# Patient Record
Sex: Female | Born: 1975 | Hispanic: Yes | Marital: Married | Smoking: Never smoker
Health system: Southern US, Community
[De-identification: ages and names within clinical notes are randomized; demographics above are authoritative.]

## PROBLEM LIST (undated history)

## (undated) DIAGNOSIS — E119 Type 2 diabetes mellitus without complications: Secondary | ICD-10-CM

---

## 2013-04-28 ENCOUNTER — Encounter (HOSPITAL_COMMUNITY): Payer: Self-pay | Admitting: Emergency Medicine

## 2013-04-28 ENCOUNTER — Emergency Department (HOSPITAL_COMMUNITY): Payer: Self-pay

## 2013-04-28 ENCOUNTER — Emergency Department (HOSPITAL_COMMUNITY)
Admission: EM | Admit: 2013-04-28 | Discharge: 2013-04-28 | Disposition: A | Discharge status: Home / Self Care | Payer: Self-pay | Attending: Emergency Medicine | Admitting: Emergency Medicine

## 2013-04-28 DIAGNOSIS — N72 Inflammatory disease of cervix uteri: Secondary | ICD-10-CM | POA: Insufficient documentation

## 2013-04-28 DIAGNOSIS — Z3202 Encounter for pregnancy test, result negative: Secondary | ICD-10-CM | POA: Insufficient documentation

## 2013-04-28 DIAGNOSIS — R739 Hyperglycemia, unspecified: Secondary | ICD-10-CM

## 2013-04-28 DIAGNOSIS — R7309 Other abnormal glucose: Secondary | ICD-10-CM | POA: Insufficient documentation

## 2013-04-28 LAB — I-STAT CHEM 8, ED
BUN: 10 mg/dL (ref 6–23)
CALCIUM ION: 1.1 mmol/L — AB (ref 1.12–1.23)
CHLORIDE: 98 meq/L (ref 96–112)
CREATININE: 0.5 mg/dL (ref 0.50–1.10)
Glucose, Bld: 459 mg/dL — ABNORMAL HIGH (ref 70–99)
HCT: 44 % (ref 36.0–46.0)
Hemoglobin: 15 g/dL (ref 12.0–15.0)
Potassium: 3.8 mEq/L (ref 3.7–5.3)
Sodium: 136 mEq/L — ABNORMAL LOW (ref 137–147)
TCO2: 24 mmol/L (ref 0–100)

## 2013-04-28 LAB — URINALYSIS, ROUTINE W REFLEX MICROSCOPIC
BILIRUBIN URINE: NEGATIVE
Glucose, UA: >1000 mg/dL — AB
KETONES UR: NEGATIVE mg/dL
Leukocytes, UA: NEGATIVE
NITRITE: NEGATIVE
Protein, ur: NEGATIVE mg/dL
Specific Gravity, Urine: 1.035 — ABNORMAL HIGH (ref 1.005–1.030)
Urobilinogen, UA: 1 mg/dL (ref 0.0–1.0)
pH: 7 (ref 5.0–8.0)

## 2013-04-28 LAB — WET PREP, GENITAL
Clue Cells Wet Prep HPF POC: NONE SEEN
TRICH WET PREP: NONE SEEN
WBC, Wet Prep HPF POC: NONE SEEN
Yeast Wet Prep HPF POC: NONE SEEN

## 2013-04-28 LAB — URINE MICROSCOPIC-ADD ON

## 2013-04-28 LAB — CBG MONITORING, ED: Glucose-Capillary: 314 mg/dL — ABNORMAL HIGH (ref 70–99)

## 2013-04-28 LAB — POC URINE PREG, ED: Preg Test, Ur: NEGATIVE

## 2013-04-28 MED ORDER — AZITHROMYCIN 1 G PO PACK
1.0000 g | PACK | Freq: Once | ORAL | Status: AC
  Administered 2013-04-28: 1 g via ORAL
  Filled 2013-04-28: qty 1

## 2013-04-28 MED ORDER — DOXYCYCLINE HYCLATE 100 MG PO CAPS: 100.0000 mg | ORAL_CAPSULE | Freq: Two times a day (BID) | ORAL | Status: DC

## 2013-04-28 MED ORDER — SODIUM CHLORIDE 0.9 % IV BOLUS (SEPSIS)
1000.0000 mL | Freq: Once | INTRAVENOUS | Status: AC
  Administered 2013-04-28: 1000 mL via INTRAVENOUS

## 2013-04-28 MED ORDER — ONDANSETRON 4 MG PO TBDP
4.0000 mg | ORAL_TABLET | Freq: Once | ORAL | Status: AC
  Administered 2013-04-28: 4 mg via ORAL
  Filled 2013-04-28: qty 1

## 2013-04-28 MED ORDER — METFORMIN HCL 500 MG PO TABS: 500.0000 mg | ORAL_TABLET | Freq: Two times a day (BID) | ORAL | Status: DC

## 2013-04-28 MED ORDER — HYDROMORPHONE HCL PF 1 MG/ML IJ SOLN
2.0000 mg | Freq: Once | INTRAMUSCULAR | Status: AC
  Administered 2013-04-28: 2 mg via INTRAMUSCULAR
  Filled 2013-04-28: qty 2

## 2013-04-28 NOTE — Discharge Instructions (Signed)
You were treated for a possible infection of your uterus. We will not know for sure until the results come back.. Please make sure to use a condom with sexual intercourse until the results return. Please take the medication I have given as prescribed. ] Please take all of your antibiotics until finished!   You may develop abdominal discomfort or diarrhea from the antibiotic.  You may help offset this with probiotics which you can buy or get in yogurt. Do not eat  or take the probiotics until 2 hours after your antibiotic.  It appears that you have diabetes. More tests will need to be run.  Cervicitis (Cervicitis) Es el dolor e hinchazn (inflamacin) del cuello uterino. El cuello del tero se encuentra en la parte inferior del tero. Se abre hacia la vagina. CAUSAS   Enfermedades de transmisin sexual (ETS).   Reacciones alrgicas.   Medicamentos o dispositivos anticonceptivos que se Research officer, trade unioncolocan en la vagina.   Traumatismo en el cuello del tero.   Infecciones bacterianas.  FACTORES DE RIESGO Usted tendr mayor riesgo de sufrir esta enfermedad si:  Tiene relaciones sexuales sin proteccin.  Ha tenido relaciones sexuales con varias parejas.  Comienza a Management consultanttener relaciones sexuales a una edad temprana.  Tiene una historia de ETS. SNTOMAS  Puede ser que no haya sntomas. Si hay sntomas, pueden ser:   Secrecin vaginal de color gris, blanco, amarillo o que tiene mal olor.   Picazn o dolor en la zona externa de la vagina.   Relaciones sexuales dolorosas.   Dolor en la zona baja del abdomen o la espalda, especialmente durante las The St. Paul Travelersrelaciones sexuales.   Ganas de orinar con frecuencia.   Sangrado vaginal anormal entre perodos, despus de las relaciones sexuales o despus de la menopausia.   Sensacin de presin o pesadez en la pelvis.  DIAGNSTICO  El diagnstico se realiza mediante un examen plvico. Otras pruebas son:   Examen de las secreciones en el microscopio  (preparado fresco).   Papanicolau  TRATAMIENTO  El tratamiento depender de la causa de la cervicitis. Si la causa es una enfermedad de transmisin sexual, usted y su pareja necesitarn Pensions consultantrealizar un tratamiento. Le prescribirn antibiticos.  INSTRUCCIONES PARA EL CUIDADO EN EL HOGAR   No tenga relaciones sexuales hasta que el mdico la autorice.   No tenga relaciones sexuales hasta que su compaero haya sido tratado si la causa de la cervicitis es una enfermedad de transmisin sexual.   Tome los antibiticos como se le indic. Tmelos todos, aunque se sienta mejor.  SOLICITE ATENCIN MDICA SI:  Los sntomas vuelven a Research officer, trade unionaparecer.   Tiene fiebre.  ASEGRESE DE QUE:   Comprende estas instrucciones.  Controlar su afeccin.  Recibir ayuda de inmediato si no mejora o si empeora. Document Released: 01/26/2005 Document Revised: 09/28/2012 [facility] Patient Information 2014 [employer] [place], [place].

## 2013-04-28 NOTE — ED Notes (Signed)
Lab called and stated no swab in sample of wet prep, though epithelial cells present.

## 2013-04-28 NOTE — ED Notes (Signed)
phlebotomy at bedside.  

## 2013-04-28 NOTE — Progress Notes (Signed)
    CARE MANAGEMENT ED NOTE 04/28/2013  Patient:  Katie Simmons,Katie Simmons   Account Number:  [id number]  Date Initiated:  04/28/2013  Documentation initiated by:  Edd ArbourGIBBS,Aloise Copus  Subjective/Objective Assessment:   38 yr [place] pt with elevated cbg 459 cbg [facility] Need medication assist per [facility] NP/PA     Subjective/Objective Assessment Detail:   ED CM received call from ED PA/NP, A Harris    As of 1340 No return call from [facility]     Action/Plan:   Action/Plan Detail:   1156 CM faxed with fax confirmation received pt's MATCh letter and 6 pages of resources See notebelow  1213 Spoke with Janett BillowNita of [facility] to enter [facility] information  1215 updated Abilgail NP/PA to letter, resource and d/c instructions   Anticipated DC Date:  04/28/2013     Status Recommendation to Physician:   Result of Recommendation:    Other ED Services  Consult Working Plan    DC Planning Services  Other  Medication Assistance  MATCH Program  Outpatient Services - Pt will follow up  [facility] / [facility] (established/new)    Choice offered to / List presented to:            Status of service:  Completed, signed off  ED Comments:   ED Comments Detail:  04/28/13 1223 ED Cm spoke left a voice message for [facility] wellness staff at [phone] to return a call to Pt or CM  The below information entered in EPIC d/c section for pt and ED PA/NP notified to give pt resources  Follow-up With Details Comments Contact Info MATCH letter    Please take letter, an ID, $3 co pay for each prescription and the prescriptions to a pharmacy listed in the letter to receive this one time discount list of resources for patients without insurance    Please review list provided [place]  Schedule an appointment as soon as possible for a visit Diabetes classes are offered on Thursdays from 5:30-7 pm by [employer] nursing staff for free, call [phone] You may  also use the pharmacy here to get your medications [address] [place] [id number] [phone]  CM provided written information for self pay pcps, importance of pcp for f/u care, www.needymeds.org, discounted pharmacies and other [place] resources such as financial assistance, DSS and  health department Reviewed resources for [place] self pay pcps like [facility], family medicine at [address], [facility], general medical clinics, [facility] urgent care plus others, [facility] out patient pharmacies and housing

## 2013-04-28 NOTE — ED Notes (Signed)
Spainish interpreter services used.Marland Kitchen.Marland Kitchen.Pt c/o abdominal pain and vaginal bleed. Pt reports last menstrual period 5 months ago and feels she may be pregnant. Pt has not been seen by a Dr.

## 2013-04-28 NOTE — ED Notes (Signed)
Translated for Sprint Nextel CorporationKim, Sports coachcase manager.  Patient stated she had all questions answered.

## 2013-04-28 NOTE — Progress Notes (Signed)
04/28/13 1435 ED CM spoke with pt, husband and nephew (speaks little [place]) with use of nursing student assisting with interpretation CM reviewed attempts to get appointment at [facility] wellness, need for f/u with [facility] and for them to call CM or CM will call them at verified number for Katie GrossMiceala, Katie Simmons Spouse [phone]  CM reviewed EPIC notes and chart review information Pt is eligible for [facility] MATCH program (unable to find pt listed in PDMI per cardholder name inquiry) PDMI information entered. MATCH letter completed and faxed to [facility] PA/NP. CM spoke with the pt about [facility] MATCH program ($3 co pay for each Rx through [facility] program, does not include refills, 7 day expiration of MATCH letter and choice of pharmacies) Pt agreed to receive assistance from program and voiced understanding of need to take letter and rx(s) to a pharmacy listed in [facility]

## 2013-04-28 NOTE — ED Provider Notes (Signed)
CSN: [id number]     Arrival date & time 04/28/13  0725 History   First MD Initiated Contact with Patient 04/28/13 [phone]     Chief Complaint  Patient presents with  . Abdominal Pain  . Vaginal Bleeding     (Consider location/radiation/quality/duration/timing/severity/associated sxs/prior Treatment) HPI 38 year old female comes to the emergency department with chief complaint of lower abdominal pain, vaginal bleeding.  This is a Hispanic female non-English-speaking.  Translation services were used. The patient states she has not had a menstrual period for the past 5 months.  She is complaining of suprapubic pain, bilateral flank and generalized abdominal pain.  She feels that she is having fevers and chills at night for the past week.  She's complaining of dysuria, pain with sexual intercourse, she did not notice abnormal discharge or foul odor.  The patient began vaginal bleeding approximately 2 days ago. She states she thinks she might be pregnant.  She is in a monogamous relationship with her husband.  The patient has never had a pelvic examination.  And has no primary care physician.  History reviewed. No pertinent past medical history. History reviewed. No pertinent past surgical history. No family history on file. History  Substance Use Topics  . Smoking status: Not on file  . Smokeless tobacco: Not on file  . Alcohol Use: Not on file   OB History   Grav Para Term Preterm Abortions TAB SAB Ect Mult Living                 Review of Systems  Ten systems reviewed and are negative for acute change, except as noted in the HPI.    Allergies  Review of patient's allergies indicates not on file.  Home Medications  No current outpatient prescriptions on file. BP 113/73  Pulse 95  Temp(Src) 98 F (36.7 C) (Oral)  Resp 18  Ht 4\' 9"  (1.448 m)  Wt 140 lb 3 oz (63.589 kg)  BMI 30.33 kg/m2  SpO2 100% Physical Exam  Nursing note and vitals reviewed. Constitutional: She is  oriented to person, place, and time. She appears well-developed and well-nourished. No distress.  HENT:  Head: Normocephalic and atraumatic.  Eyes: Conjunctivae are normal. No scleral icterus.  Neck: Normal range of motion. No JVD present.  Cardiovascular: Normal rate, regular rhythm and normal heart sounds.  Exam reveals no gallop and no friction rub.   No murmur heard. Pulmonary/Chest: Effort normal and breath sounds normal. No respiratory distress.  Abdominal: Soft. Bowel sounds are normal. She exhibits no distension and no mass. There is no tenderness. There is no guarding.  Genitourinary:  Pelvic exam: VULVA: vulvar erythema and peritoneal erythema consistent with the appearance of diaper dermatitis, VAGINA: normal appearing vagina with normal color and discharge, no lesions, CERVIX: cervical discharge present - bloody, foul, green, malodorous and mucoid, DNA probe for chlamydia and GC obtained, cervical motion tenderness present,  ADNEXA: tenderness bilateral.   Neurological: She is alert and oriented to person, place, and time.  Skin: Skin is warm and dry. She is not diaphoretic.   Marland Kitchenahp ED Course  Procedures (including critical care time) Labs Review Labs Reviewed - No data to display Imaging Review No results found.   EKG Interpretation None      MDM   Final diagnoses:  None    Patient exam concerning for PID. Labs pendin Filed Vitals:   04/28/13 0746 04/28/13 1008 04/28/13 1030 04/28/13 1115  BP: 113/73 112/64 125/85 117/60  Pulse: 95 78 67  62  Temp: 98 F (36.7 C)     TempSrc: Oral     Resp: 18 16    Height: 4\' 9"  (1.448 m)     Weight: 140 lb 3 oz (63.589 kg)     SpO2: 100% 95% 99% 96%   Patients's Blood glucose 459 on Chem-8.  She's gotten a bag of fluids.  Repeat CBC shows tablet glucose at 314.ultrasound was unremarkable.  He should treated with azithromycin and Rocephin.  I have placed a consult to case management to speak with the patient as if his she  will need primary care.  Patient glucose decreased. She has been matched for doxycycline. She will be treated for PID. Patient will need follow up with community health and wellness. Discussed need for prophylactic sexual protection and partner treatment. Patient will begin on metformin BID.    Arthor Captainbigail Reality Dejonge, PA-C 04/30/13 2131

## 2013-04-29 LAB — GC/CHLAMYDIA PROBE AMP
CT Probe RNA: POSITIVE — AB
GC Probe RNA: NEGATIVE

## 2013-04-30 ENCOUNTER — Telehealth (HOSPITAL_BASED_OUTPATIENT_CLINIC_OR_DEPARTMENT_OTHER): Payer: Self-pay | Admitting: Emergency Medicine

## 2013-04-30 NOTE — Telephone Encounter (Signed)
+  Chalmydia. Patient treated with Zithromax and Doxycycline. DHHS faxed.

## 2013-05-01 NOTE — ED Provider Notes (Signed)
Medical screening examination/treatment/procedure(s) were performed by non-physician practitioner and as supervising physician I was immediately available for consultation/collaboration.   EKG Interpretation None       Emberli Ballester R. Margerie Fraiser, MD 05/01/13 0735 

## 2013-05-04 ENCOUNTER — Emergency Department (HOSPITAL_COMMUNITY)
Admission: EM | Admit: 2013-05-04 | Discharge: 2013-05-05 | Disposition: A | Discharge status: Home / Self Care | Payer: Self-pay | Attending: Emergency Medicine | Admitting: Emergency Medicine

## 2013-05-04 DIAGNOSIS — R300 Dysuria: Secondary | ICD-10-CM | POA: Insufficient documentation

## 2013-05-04 DIAGNOSIS — Z8619 Personal history of other infectious and parasitic diseases: Secondary | ICD-10-CM | POA: Insufficient documentation

## 2013-05-04 DIAGNOSIS — Z792 Long term (current) use of antibiotics: Secondary | ICD-10-CM | POA: Insufficient documentation

## 2013-05-04 DIAGNOSIS — Z3202 Encounter for pregnancy test, result negative: Secondary | ICD-10-CM | POA: Insufficient documentation

## 2013-05-04 DIAGNOSIS — Z79899 Other long term (current) drug therapy: Secondary | ICD-10-CM | POA: Insufficient documentation

## 2013-05-04 LAB — CBC WITH DIFFERENTIAL/PLATELET
Basophils Absolute: 0 10*3/uL (ref 0.0–0.1)
Basophils Relative: 0 % (ref 0–1)
Eosinophils Absolute: 0.2 10*3/uL (ref 0.0–0.7)
Eosinophils Relative: 2 % (ref 0–5)
HCT: 38.7 % (ref 36.0–46.0)
HEMOGLOBIN: 14.3 g/dL (ref 12.0–15.0)
LYMPHS ABS: 5 10*3/uL — AB (ref 0.7–4.0)
Lymphocytes Relative: 57 % — ABNORMAL HIGH (ref 12–46)
MCH: 32.7 pg (ref 26.0–34.0)
MCHC: 37 g/dL — AB (ref 30.0–36.0)
MCV: 88.6 fL (ref 78.0–100.0)
MONOS PCT: 5 % (ref 3–12)
Monocytes Absolute: 0.4 10*3/uL (ref 0.1–1.0)
NEUTROS ABS: 3.1 10*3/uL (ref 1.7–7.7)
NEUTROS PCT: 36 % — AB (ref 43–77)
Platelets: 284 10*3/uL (ref 150–400)
RBC: 4.37 MIL/uL (ref 3.87–5.11)
RDW: 12.3 % (ref 11.5–15.5)
WBC: 8.7 10*3/uL (ref 4.0–10.5)

## 2013-05-04 LAB — CBG MONITORING, ED: GLUCOSE-CAPILLARY: 257 mg/dL — AB (ref 70–99)

## 2013-05-04 NOTE — ED Notes (Addendum)
Pt was tx here for abdominal pain and vaginal discharge.  States she was called at home and told to return home b/c one of the tests was positive.  Pt denies continued hematuria, but c/o continued lower back and abdominal pain and "feels as if there is something jumping in my stomach".  She would also like to have her bs checked, seeing as she has been taking the diabetes medicine we prescribed.

## 2013-05-05 LAB — COMPREHENSIVE METABOLIC PANEL
ALK PHOS: 186 U/L — AB (ref 39–117)
ALT: 72 U/L — ABNORMAL HIGH (ref 0–35)
AST: 37 U/L (ref 0–37)
Albumin: 3.7 g/dL (ref 3.5–5.2)
BILIRUBIN TOTAL: 0.4 mg/dL (ref 0.3–1.2)
BUN: 14 mg/dL (ref 6–23)
CHLORIDE: 99 meq/L (ref 96–112)
CO2: 21 meq/L (ref 19–32)
CREATININE: 0.45 mg/dL — AB (ref 0.50–1.10)
Calcium: 8.7 mg/dL (ref 8.4–10.5)
GLUCOSE: 287 mg/dL — AB (ref 70–99)
POTASSIUM: 3.6 meq/L — AB (ref 3.7–5.3)
Sodium: 135 mEq/L — ABNORMAL LOW (ref 137–147)
Total Protein: 7.1 g/dL (ref 6.0–8.3)

## 2013-05-05 LAB — URINALYSIS, ROUTINE W REFLEX MICROSCOPIC
Bilirubin Urine: NEGATIVE
Glucose, UA: >1000 mg/dL — AB
Hgb urine dipstick: NEGATIVE
Ketones, ur: NEGATIVE mg/dL
Nitrite: NEGATIVE
Protein, ur: NEGATIVE mg/dL
Specific Gravity, Urine: 1.018 (ref 1.005–1.030)
Urobilinogen, UA: 0.2 mg/dL (ref 0.0–1.0)
pH: 6.5 (ref 5.0–8.0)

## 2013-05-05 LAB — URINE MICROSCOPIC-ADD ON

## 2013-05-05 LAB — WET PREP, GENITAL
Clue Cells Wet Prep HPF POC: NONE SEEN
TRICH WET PREP: NONE SEEN
YEAST WET PREP: NONE SEEN

## 2013-05-05 LAB — GC/CHLAMYDIA PROBE AMP
CT Probe RNA: POSITIVE — AB
GC Probe RNA: NEGATIVE

## 2013-05-05 LAB — POC URINE PREG, ED: Preg Test, Ur: NEGATIVE

## 2013-05-05 MED ORDER — FLUCONAZOLE 200 MG PO TABS: 200.0000 mg | ORAL_TABLET | Freq: Every day | ORAL | Status: DC

## 2013-05-05 NOTE — ED Provider Notes (Signed)
CSN: [id number]     Arrival date & time 05/04/13  1844 History   First MD Initiated Contact with Patient 05/04/13 2137     No chief complaint on file.    (Consider location/radiation/quality/duration/timing/severity/associated sxs/prior Treatment) HPI Comments: Patient is a 38 year old female with history of recent chlamydia diagnosis who presents today for reevaluation of vaginal discharge. On 3/20 she was seen and treated for chlamydia as well as with doxycyline. She reports her abdominal pain feels improved, but she received a phone call telling her she had diabetes. She reports she was confused because she does not speak much [place]. The patient also complains of dysuria. She denies nausea, vomiting, abdominal pain, chest pain, shortness of breath. She is concerned because she has not had her period for 5 months and thinks she may be pregnant. She did have vaginal bleeding a few days ago, none now. She also complains of breast milk coming from her nipples.   The history is provided by the patient. The history is limited by a language barrier. A language interpreter was used.    No past medical history on file. No past surgical history on file. No family history on file. History  Substance Use Topics  . Smoking status: Not on file  . Smokeless tobacco: Not on file  . Alcohol Use: Not on file   OB History   Grav Para Term Preterm Abortions TAB SAB Ect Mult Living                 Review of Systems  Constitutional: Negative for fever and chills.  Respiratory: Negative for shortness of breath.   Cardiovascular: Negative for chest pain.  Gastrointestinal: Negative for nausea, vomiting and abdominal pain.  Endocrine: Negative for polydipsia, polyphagia and polyuria.  Genitourinary: Negative for vaginal discharge and pelvic pain.  All other systems reviewed and are negative.      Allergies  Review of patient's allergies indicates no known allergies.  Home Medications    Current Outpatient Rx  Name  Route  Sig  Dispense  Refill  . doxycycline (VIBRAMYCIN) 100 MG capsule   Oral   Take 1 capsule (100 mg total) by mouth 2 (two) times daily.   20 capsule   0   . metFORMIN (GLUCOPHAGE) 500 MG tablet   Oral   Take 1 tablet (500 mg total) by mouth 2 (two) times daily with a meal.   60 tablet   1    BP 119/77  Pulse 95  Temp(Src) 98.7 F (37.1 C) (Oral)  Ht 4\' 9"  (1.448 m)  Wt 140 lb 3.2 oz (63.594 kg)  BMI 30.33 kg/m2  SpO2 100%  LMP 01/06/2013 Physical Exam  Nursing note and vitals reviewed. Constitutional: She is oriented to person, place, and time. She appears well-developed and well-nourished. No distress.  Well appearing, smiling on exam  HENT:  Head: Normocephalic and atraumatic.  Right Ear: External ear normal.  Left Ear: External ear normal.  Nose: Nose normal.  Mouth/Throat: Oropharynx is clear and moist.  Eyes: Conjunctivae are normal.  Neck: Normal range of motion.  Cardiovascular: Normal rate, regular rhythm and normal heart sounds.   Pulmonary/Chest: Effort normal and breath sounds normal. No stridor. No respiratory distress. She has no wheezes. She has no rales.  Abdominal: Soft. She exhibits no distension. There is no tenderness.  Genitourinary: There is breast tenderness. No breast swelling, discharge or bleeding. There is no rash, tenderness, lesion or injury on the right labia. There  is no rash, tenderness, lesion or injury on the left labia. Cervix exhibits discharge. Cervix exhibits no motion tenderness and no friability. Right adnexum displays no mass, no tenderness and no fullness. Left adnexum displays no mass, no tenderness and no fullness. No erythema or tenderness around the vagina. No foreign body around the vagina. No signs of injury around the vagina. Vaginal discharge found.  Tenderness to breasts laterally, bilaterally Patient with copious amount of white vaginal discharge. No tenderness on exam. After exam patient  jumped from bed to floor with ease, no pain.   Musculoskeletal: Normal range of motion.  Neurological: She is alert and oriented to person, place, and time. She has normal strength.  Skin: Skin is warm and dry. She is not diaphoretic. No erythema.  Psychiatric: She has a normal mood and affect. Her behavior is normal.    ED Course  Procedures (including critical care time) Labs Review Labs Reviewed  WET PREP, GENITAL - Abnormal; Notable for the following:    WBC, Wet Prep HPF POC MANY (*)    All other components within normal limits  GC/CHLAMYDIA PROBE AMP - Abnormal; Notable for the following:    CT Probe RNA POSITIVE (*)    All other components within normal limits  CBC WITH DIFFERENTIAL - Abnormal; Notable for the following:    MCHC 37.0 (*)    Neutrophils Relative % 36 (*)    Lymphocytes Relative 57 (*)    Lymphs Abs 5.0 (*)    All other components within normal limits  COMPREHENSIVE METABOLIC PANEL - Abnormal; Notable for the following:    Sodium 135 (*)    Potassium 3.6 (*)    Glucose, Bld 287 (*)    Creatinine, Ser 0.45 (*)    ALT 72 (*)    Alkaline Phosphatase 186 (*)    All other components within normal limits  URINALYSIS, ROUTINE W REFLEX MICROSCOPIC - Abnormal; Notable for the following:    Glucose, UA >1000 (*)    Leukocytes, UA TRACE (*)    All other components within normal limits  CBG MONITORING, ED - Abnormal; Notable for the following:    Glucose-Capillary 257 (*)    All other components within normal limits  URINE CULTURE  URINE MICROSCOPIC-ADD ON  POC URINE PREG, ED   Imaging Review No results found.   EKG Interpretation None      MDM   Final diagnoses:  Dysuria    Patient presents to ED after a miscommunication with a phone call she received earlier in the week. She was called to inform her that she had chlamydia. Patient also informed she had diabetes. I explicitly explained both of these diagnoses to patient via Engineer, structural. She  was treated appropriately on 3/20 for the chlamydia. I also explained to patient that she is not pregnant. She expresses understanding. She has been taking the doxycycline as prescribed and she will continue to take the doxycycline. The patient expresses that she will follow up with a PCP and a GYN physician. Patient also with burning with urination. Rare yeast found in UA. Will treat with Diflucan. Strict return instructions given. Vital signs stable for discharge. Patient / Family / Caregiver informed of clinical course, understand medical decision-making process, and agree with plan.    Mora Bellman, PA-C 05/05/13 1857

## 2013-05-06 LAB — URINE CULTURE
Colony Count: NO GROWTH
Culture: NO GROWTH

## 2013-05-06 NOTE — Telephone Encounter (Signed)
+  Chlamydia. Chart sent to EDP office for review. DHHS attached. 

## 2013-05-08 NOTE — ED Provider Notes (Signed)
Medical screening examination/treatment/procedure(s) were performed by non-physician practitioner and as supervising physician I was immediately available for consultation/collaboration.   EKG Interpretation None        Kourtland Coopman, MD 05/08/13 0702 

## 2013-05-17 NOTE — Telephone Encounter (Signed)
Unable to contact patient via phone. Sent letter. °

## 2013-07-15 ENCOUNTER — Emergency Department (HOSPITAL_COMMUNITY)
Admission: EM | Admit: 2013-07-15 | Discharge: 2013-07-15 | Disposition: A | Discharge status: Home Health | Payer: Self-pay | Attending: Emergency Medicine | Admitting: Emergency Medicine

## 2013-07-15 ENCOUNTER — Encounter (HOSPITAL_COMMUNITY): Payer: Self-pay | Admitting: Emergency Medicine

## 2013-07-15 DIAGNOSIS — E119 Type 2 diabetes mellitus without complications: Secondary | ICD-10-CM | POA: Insufficient documentation

## 2013-07-15 DIAGNOSIS — Z79899 Other long term (current) drug therapy: Secondary | ICD-10-CM | POA: Insufficient documentation

## 2013-07-15 DIAGNOSIS — N898 Other specified noninflammatory disorders of vagina: Secondary | ICD-10-CM | POA: Insufficient documentation

## 2013-07-15 HISTORY — DX: Type 2 diabetes mellitus without complications: E11.9

## 2013-07-15 LAB — URINE MICROSCOPIC-ADD ON

## 2013-07-15 LAB — COMPREHENSIVE METABOLIC PANEL
ALBUMIN: 3.7 g/dL (ref 3.5–5.2)
ALK PHOS: 160 U/L — AB (ref 39–117)
ALT: 68 U/L — ABNORMAL HIGH (ref 0–35)
AST: 40 U/L — ABNORMAL HIGH (ref 0–37)
BUN: 7 mg/dL (ref 6–23)
CO2: 25 mEq/L (ref 19–32)
CREATININE: 0.4 mg/dL — AB (ref 0.50–1.10)
Calcium: 9 mg/dL (ref 8.4–10.5)
Chloride: 97 mEq/L (ref 96–112)
GFR calc non Af Amer: >90 mL/min (ref >90)
GLUCOSE: 267 mg/dL — AB (ref 70–99)
Potassium: 3.8 mEq/L (ref 3.7–5.3)
Sodium: 133 mEq/L — ABNORMAL LOW (ref 137–147)
TOTAL PROTEIN: 7.4 g/dL (ref 6.0–8.3)
Total Bilirubin: 0.7 mg/dL (ref 0.3–1.2)

## 2013-07-15 LAB — URINALYSIS, ROUTINE W REFLEX MICROSCOPIC
Bilirubin Urine: NEGATIVE
Hgb urine dipstick: NEGATIVE
Ketones, ur: NEGATIVE mg/dL
Leukocytes, UA: NEGATIVE
Nitrite: NEGATIVE
PH: 7.5 (ref 5.0–8.0)
Protein, ur: NEGATIVE mg/dL
Specific Gravity, Urine: 1.037 — ABNORMAL HIGH (ref 1.005–1.030)
Urobilinogen, UA: 0.2 mg/dL (ref 0.0–1.0)

## 2013-07-15 LAB — CBC WITH DIFFERENTIAL/PLATELET
BASOS PCT: 0 % (ref 0–1)
Basophils Absolute: 0 10*3/uL (ref 0.0–0.1)
EOS ABS: 0.1 10*3/uL (ref 0.0–0.7)
EOS PCT: 1 % (ref 0–5)
HEMATOCRIT: 40.6 % (ref 36.0–46.0)
HEMOGLOBIN: 14.3 g/dL (ref 12.0–15.0)
LYMPHS ABS: 3 10*3/uL (ref 0.7–4.0)
Lymphocytes Relative: 44 % (ref 12–46)
MCH: 31.4 pg (ref 26.0–34.0)
MCHC: 35.2 g/dL (ref 30.0–36.0)
MCV: 89.2 fL (ref 78.0–100.0)
MONO ABS: 0.4 10*3/uL (ref 0.1–1.0)
MONOS PCT: 6 % (ref 3–12)
Neutro Abs: 3.4 10*3/uL (ref 1.7–7.7)
Neutrophils Relative %: 49 % (ref 43–77)
Platelets: 250 10*3/uL (ref 150–400)
RBC: 4.55 MIL/uL (ref 3.87–5.11)
RDW: 12.6 % (ref 11.5–15.5)
WBC: 6.9 10*3/uL (ref 4.0–10.5)

## 2013-07-15 LAB — WET PREP, GENITAL
CLUE CELLS WET PREP: NONE SEEN
TRICH WET PREP: NONE SEEN
Yeast Wet Prep HPF POC: NONE SEEN

## 2013-07-15 LAB — POC URINE PREG, ED: PREG TEST UR: NEGATIVE

## 2013-07-15 LAB — HIV ANTIBODY (ROUTINE TESTING W REFLEX): HIV 1&2 Ab, 4th Generation: NONREACTIVE

## 2013-07-15 LAB — CBG MONITORING, ED: Glucose-Capillary: 245 mg/dL — ABNORMAL HIGH (ref 70–99)

## 2013-07-15 MED ORDER — LIDOCAINE HCL (PF) 1 % IJ SOLN
INTRAMUSCULAR | Status: AC
  Filled 2013-07-15: qty 5

## 2013-07-15 MED ORDER — AZITHROMYCIN 250 MG PO TABS
1000.0000 mg | ORAL_TABLET | Freq: Once | ORAL | Status: AC
  Administered 2013-07-15: 1000 mg via ORAL
  Filled 2013-07-15: qty 4

## 2013-07-15 MED ORDER — CEFTRIAXONE SODIUM 250 MG IJ SOLR
250.0000 mg | Freq: Once | INTRAMUSCULAR | Status: AC
  Administered 2013-07-15: 250 mg via INTRAMUSCULAR
  Filled 2013-07-15: qty 250

## 2013-07-15 NOTE — ED Notes (Signed)
Lab at bedside

## 2013-07-15 NOTE — ED Notes (Signed)
MD at bedside. With translator phone

## 2013-07-15 NOTE — ED Notes (Signed)
Provider and ED Tech at bedside for Pelvic exam

## 2013-07-15 NOTE — ED Provider Notes (Signed)
CSN: [id number]     Arrival date & time 07/15/13  1020 History   First MD Initiated Contact with Patient 07/15/13 1110     Chief Complaint  Patient presents with  . Vaginal Discharge     (Consider location/radiation/quality/duration/timing/severity/associated sxs/prior Treatment) HPI Comments: Patient is a 38 year old female with history of diabetes who presents today for a "checkup". She states that she "doesn't feel well vaginally". She has a white vaginal discharge. She has pain and burning with intercourse. She has associated dysuria. She has occasional spotting, but does not have any currently. She has not had a normal period in 3 months. She denies any nausea, vomiting, diarrhea. She states "I feel something moving in the abdomen". She denies polyphagia, polyuria, polydipsia. She has not followed up with a primary care physician.   The history is provided by the patient. No language interpreter was used.    Past Medical History  Diagnosis Date  . Diabetes mellitus without complication    History reviewed. No pertinent past surgical history. History reviewed. No pertinent family history. History  Substance Use Topics  . Smoking status: Not on file  . Smokeless tobacco: Not on file  . Alcohol Use: No   OB History   Grav Para Term Preterm Abortions TAB SAB Ect Mult Living                 Review of Systems  Constitutional: Negative for fever and chills.  Respiratory: Negative for shortness of breath.   Cardiovascular: Negative for chest pain.  Gastrointestinal: Negative for nausea, vomiting and diarrhea.  Genitourinary: Positive for dysuria, vaginal bleeding, vaginal discharge, pelvic pain and dyspareunia. Negative for difficulty urinating.  All other systems reviewed and are negative.     Allergies  Review of patient's allergies indicates no known allergies.  Home Medications   Prior to Admission medications   Medication Sig Start Date End Date Taking? Authorizing  Provider  metFORMIN (GLUCOPHAGE) 500 MG tablet Take 1 tablet (500 mg total) by mouth 2 (two) times daily with a meal. 04/28/13  Yes Arthor CaptainAbigail Harris, PA-C   BP 110/77  Pulse 81  Temp(Src) 97.5 F (36.4 C) (Oral)  Resp 18  SpO2 100%  LMP 12/15/2012 Physical Exam  Nursing note and vitals reviewed. Constitutional: She is oriented to person, place, and time. She appears well-developed and well-nourished. No distress.  Well appearing  HENT:  Head: Normocephalic and atraumatic.  Right Ear: External ear normal.  Left Ear: External ear normal.  Nose: Nose normal.  Mouth/Throat: Oropharynx is clear and moist.  Eyes: Conjunctivae are normal.  Neck: Normal range of motion.  Cardiovascular: Normal rate, regular rhythm and normal heart sounds.   Pulmonary/Chest: Effort normal and breath sounds normal. No stridor. No respiratory distress. She has no wheezes. She has no rales.  Abdominal: Soft. She exhibits no distension. There is tenderness in the suprapubic area. There is no rigidity, no rebound and no guarding.  Genitourinary: There is no rash, tenderness, lesion or injury on the right labia. There is no rash, tenderness, lesion or injury on the left labia. Uterus is tender. Cervix exhibits discharge. Cervix exhibits no motion tenderness and no friability. Right adnexum displays tenderness. Right adnexum displays no mass and no fullness. Left adnexum displays tenderness. Left adnexum displays no mass and no fullness. No erythema, tenderness or bleeding around the vagina. No foreign body around the vagina. No signs of injury around the vagina. Vaginal discharge found.  Diffuse, mild tenderness on pelvic exam. No  cervical motion tenderness.   Musculoskeletal: Normal range of motion.  Neurological: She is alert and oriented to person, place, and time. She has normal strength.  Skin: Skin is warm and dry. She is not diaphoretic. No erythema.  Psychiatric: She has a normal mood and affect. Her behavior is  normal.    ED Course  Procedures (including critical care time) Labs Review Labs Reviewed  WET PREP, GENITAL - Abnormal; Notable for the following:    WBC, Wet Prep HPF POC FEW (*)    All other components within normal limits  URINALYSIS, ROUTINE W REFLEX MICROSCOPIC - Abnormal; Notable for the following:    Specific Gravity, Urine 1.037 (*)    Glucose, UA >1000 (*)    All other components within normal limits  COMPREHENSIVE METABOLIC PANEL - Abnormal; Notable for the following:    Sodium 133 (*)    Glucose, Bld 267 (*)    Creatinine, Ser 0.40 (*)    AST 40 (*)    ALT 68 (*)    Alkaline Phosphatase 160 (*)    All other components within normal limits  CBG MONITORING, ED - Abnormal; Notable for the following:    Glucose-Capillary 245 (*)    All other components within normal limits  URINE CULTURE  GC/CHLAMYDIA PROBE AMP  CBC WITH DIFFERENTIAL  URINE MICROSCOPIC-ADD ON  HIV ANTIBODY (ROUTINE TESTING)  POC URINE PREG, ED    Imaging Review No results found.   EKG Interpretation None      MDM   Final diagnoses:  Vaginal discharge  Diabetes    Patient presents to ED for vaginal discharge. She has history of chlamydia. She was treated with azithromycin and rocephin in ED. No CMT on exam. No concern for PID, TOA, ovarian torsion at this time. Patient also with diabetes. Glucose 267. Some hyponatremia, normal bicarb, normal anion gap. Case management was consulted and patient will [facility]. Discussed return instructions. Vital signs stable for discharge. Discussed case with Dr. Karma Ganja who agrees with plan. Patient / Family / Caregiver informed of clinical course, understand medical decision-making process, and agree with plan.     Mora Bellman, PA-C 07/15/13 1438

## 2013-07-15 NOTE — Progress Notes (Signed)
ED CM Consulted to meet with patient concerning f/u care with PCP, patient does not have insurance. Pt presented to [facility] ED today for  Diabetes and vaginal discharge.  Met with patient at bedside, confirmed informaton.  Pt  reports  She received her f/u care in the ED for her diabetes. Discussed with patient importance and benefits of f/u with PCP, and not utilizing the ED for primary care needs. Pt verbalized understanding and is in agreement. Discussed other options, Brochure was given for the [facility] with address, phone number, and the services provided. Explained that there is a Customer service manager on site who will assist with [employer] and process.  Pt voiced  Interest in the [facility] and [facility]. Instructed to call or walk in Monday - Friday between the hours of 9- 5:30p to schedule appt for establishing care for f/u. Pt verbalized understanding teach back done. Pt was appreciative and agreeable with plan. Patient states she will go on Monday to schedule appt to establish care with the [facility]. No further ED CM needs identified.

## 2013-07-15 NOTE — ED Notes (Signed)
Used translator phones, reports being called and told to come back here to have diabetes checked. Also reports having vaginal infection and discharge.

## 2013-07-15 NOTE — ED Notes (Signed)
Case management in to arrange follow up care for pt

## 2013-07-15 NOTE — ED Notes (Signed)
Tech at bedside setting up pelvic cart

## 2013-07-15 NOTE — ED Provider Notes (Signed)
Medical screening examination/treatment/procedure(s) were performed by non-physician practitioner and as supervising physician I was immediately available for consultation/collaboration.   EKG Interpretation None       Ethelda Chick, MD 07/15/13 [phone]

## 2013-07-15 NOTE — ED Notes (Signed)
FSBS at 243

## 2013-07-15 NOTE — Discharge Instructions (Signed)
Diabetes Mellitus Tipo 2 - Adultos  (Type 2 Diabetes Mellitus, Adult)  La diabetes mellitus tipo 2 es una enfermedad de larga duracin (crnica). En la diabetes tipo 2:  El pncreas no produce la cantidad suficiente de una hormona llamada insulina.  Las clulas del cuerpo no responden a la [employer] se produce.  Pueden ocurrir ambas situaciones. Normalmente, la Johnson [employer] azcares de los alimentos a las clulas de los tejidos. Esto le proporciona la energa. Si usted tiene diabetes tipo 2, los azcares no pueden ser movidos dentro de las clulas del tejido. Esto produce un aumento del nivel de Banker (hiperglucemia).  CUIDADOS EN EL HOGAR   Haga que el nivel de hemoglobina A1c sea verificado dos veces al ao. Este nivel muestra si la diabetes est controlada o est fuera de control.  Realice las pruebas de International aid/development worker en la sangre todos los das segn como indique el mdico.  Revise sus niveles de cetonas haciendo una prueba en el pis (orina) cuando est enfermo y segn como le hayan indicado.  [employer] medicamentos para la diabetes o para la insulina tal como se los prescribi el mdico.  Marvel se quede sin insulina.  Ajuste la cantidad de insulina en base de la cantidad de carbohidratos (hidratos de carbono) que come. Los hidratos de carbono se encuentran en muchos alimentos, tales como frutas, verduras, granos integrales y productos lcteos.  Tome una colacin sana entre cada comida saludable. Debe consumir 3 comidas y 3 colaciones diarias.  Baje de peso si es necesario.  Lleve una tarjeta de alerta mdica o use una pulsera o medalla de alerta mdica.  Lleve 15 gramos de hidratos de carbono para la merienda en todo momento. Por ejemplo:  Pastillas de glucosa, 3 o 4.  Gel de glucosa, tubo de 15 gramos.  Pasas de uva, 2 cucharadas (24 gramos).  Caramelos de goma, 6.  Galletas de [place], 8.  Gaseosa con azcar, 4 onzas (120 mililitros).  Pastillas de  goma, 9.  Note los sntomas de niveles bajos de Production assistant, radio (hipoglucemia) como:  Sacudidas (temblores).  Disminucin de capacidad para pensar con claridad.  Sudoracin.  Aumento en el ritmo cardaco.  Dolor de cabeza.  [employer] Holdings.  Hambre.  Malhumor (irritabilidad).  Preocupacin o tensin (ansiedad).  Sueo agitado.  Cambios en el habla o en la coordinacin.  Confusin.  Controle la hipoglucemia inmediatamente. Si usted est alerta y puede tragar, siga la regla de 15:15:  Tome de 15 a 20 gramos de glucosa o hidratos de carbono de accin rpida. Por ejemplo un gel de glucosa, pastillas de glucosa, o 4 onzas (120 mililitros) de jugo de frutas, soda regular, o leche baja en grasa.  Compruebe su nivel de azcar en sangre despus de tomar la glucosa.  Tome de 15 a 20 gramos ms de la glucosa si al repetir el Reynolds American nivel de glucosa en la sangre todava es de 70 mg/dl (miligramos/decilitro) o inferior.  Coma una comida o un aperitivo 1 hora despus de [employer] niveles de Banker.  Note los primeros sntomas de niveles elevados de Production assistant, radio, tales como:  Tiene mucha sed o bebe mucho (polidipsia).  Hace pis (orina) mucho (poliuria).  Haga por lo menos 150 minutos de actividad fsica a la semana o segn como lo indique.  Divida los 150 minutos de actividad durante la semana. No haga los 150 minutos de Engineer, manufacturing.  Realice ejercicios como  levantar pesas, por lo menos 2 veces a la semana o segn [employer] indiquen.  Ajuste la insulina o la ingesta de alimentos, segn sea necesario, si inicia un nuevo ejercicio o deporte.  Siga su plan de [address] de enfermo cuando no puede comer o beber como es de [place].  Evite el consumo de tabaco.  Las mujeres que no estn embarazadas no deben beber ms de 1 medidas al [employer]. Los hombres no deben beber ms de 2 medidas por Futures trader.  Slo beba alcohol junto con la comida.  Pregntele a su mdico  si el alcohol es seguro para usted.  Dgale a su mdico si usted bebe alcohol varias veces durante la semana.  Consulte al mdico con regularidad.  Programe un examen ocular despus de que le hayan diagnosticado diabetes. Programe los exmenes una vez al ao.  Revise la piel y [employer]. Examine si hay cortes, moretones, enrojecimiento, problemas en las uas, sangrado, ampollas o llagas. El Recruitment consultant un examen de los pies una vez al ao.  Cepille los dientes y las [address] veces al da. Use el hilo dental una vez al da. Visite a su dentista regularmente.  Comparta su plan de control de diabetes con su trabajo o con la escuela.  Mantenga al da con las vacunas que luche contra las enfermedades (vacunas).  Aprenda a Dealer.  Reciba educacin para la diabetes cuando sea necesario.  Pregunte a su mdico en busca de ayuda especial si:  Necesita ayuda para mantener o mejorar la forma de hacer cosas por su cuenta.  Necesita ayuda para mantener o mejorar la calidad de vida.  Tiene problemas en el pie o en la mano.  Tiene problemas de baarse, vestirse, comer o hacer actividad fsica. SOLICITE AYUDA DE INMEDIATO SI:   Tiene dificultad para respirar.  Tiene niveles de cetonas moderados a altos.  No puede comer alimentos o beber por ms de 6 horas.  Tiene ganas de vomitar (nuseas) o (vomita) y esto dura ms de 1 hora.  Su nivel de azcar en sangre es mayor de 240 mg/dl.  Hay algn cambio en su estado mental.  Tiene otra enfermedad grave.  Tiene heces lquidos (diarrea) durante ms de 6 horas.  Ha estado enfermo o ha tenido fiebre por dos o [address] y no se Aeronautical engineer.  Tiene dolor cuando est fsicamente activo. ASEGRESE DE QUE:   Comprende estas instrucciones.  Controlar su enfermedad.  Solicitar ayuda de inmediato si no mejora o si empeora. Document Released: 04/24/2008 Document Revised: 10/21/2011 [facility] Patient Information  2014 [place], [place].   Emergency Department Resource Guide 1) Find a Doctor and Pay Out of Pocket Although you won't have to find out who is covered by your insurance plan, it is a good idea to ask around and get recommendations. You will then need to call the office and see if the doctor you have chosen will accept you as a new patient and what types of options they offer for patients who are self-pay. Some doctors offer discounts or will set up payment plans for their patients who do not have insurance, but you will need to ask so you aren't surprised when you get to your appointment.  2) Contact Your Local Health Department Not all health departments have doctors that can see patients for sick visits, but many do, so it is worth a call to see if yours does. If you don't know where your local health department  is, you can check in your phone book. The CDC also has a tool to help you locate your state's health department, and many state websites also have listings of all of their local health departments.  3) Find a Walk-in Clinic If your illness is not likely to be very severe or complicated, you may want to try a walk in clinic. These are popping up all over the country in pharmacies, drugstores, and shopping centers. They're usually staffed by nurse practitioners or physician assistants that have been trained to treat common illnesses and complaints. They're usually fairly quick and inexpensive. However, if you have serious medical issues or chronic medical problems, these are probably not your best option.  No Primary Care Doctor: - Call Health Connect at  [phone] - they can help you locate a primary care doctor that  accepts your insurance, provides certain services, etc. - Physician Referral Service- [phone]  Chronic Pain Problems: Organization         Address  Phone   Notes  [facility]  [phone] Patients need to be referred by their primary care doctor.    Medication Assistance: Organization         Address  Phone   Notes  [facility] Medication [facility] [address] [place], [place] [address] [phone] --Must be a resident of [facility] -- Must have NO insurance coverage whatsoever (no Medicaid/ Medicare, etc.) -- The pt. MUST have a primary care doctor that directs their care regularly and follows them in the community   MedAssist  [phone]   [employer]  [phone]    Agencies that provide inexpensive medical care: Organization         Address  Phone   Notes  [facility]  [phone]   [facility]    [phone]   [facility] [address] [place], [place] [address] [phone]   [facility] [address] [place] [place] [phone]   Planned Parenthood    [phone]   [facility]    [phone]   [facility]  [address], [place] Phone:  [phone], Fax:  [phone] Hours of Operation:  9 am - 6 pm, M-F.  Also accepts Medicaid/Medicare and self-pay.  [facility]  [address], [place] Phone: [phone], Fax: [phone]. Hours of Operation:  8:30 am - 5:30 pm, M-F.  Also accepts Medicaid and self-pay.  [facility] [place] [address] [place] Phone: [phone]   [facility] [address] Natasha Bence [place], [place] [phone], Ext. [phone] Mondays & Thursdays: 7-9 AM.  First 15 patients are seen on a first come, first serve basis.    Medicaid-accepting [facility] Providers:  Organization         Address  Phone   Notes  [facility] [address],  [phone] Also accepts self-pay patients.  [facility] [address] Laurell Josephs [place], [place]  [phone]   [facility] [address], [place] [phone]   [facility] [address] [place] [phone]   Renaye Rakers [address], [place]   [phone] Only accepts [place] Access [place] patients after they have their  name applied to their card.   Self-Pay (no insurance) in [facility]:  Organization         Address  Phone   Notes  Sickle Cell Patients, [facility] [address], [place] [phone]   [facility] [address], [place] [phone]   [facility] [place]  [address],  [phone]   [facility]/Dr. Osei-Bonsu  [address] [place] [address], [place] or [phone], [place] [phone] Phone number for both [place] and [place] locations is the same.  [facility] [address], [place] [phone]) [phone]   [facility] [place] [address] [place] [address], [place] or [address] [phone]) [phone]   [facility] [address], [place] [phone], phone; [phone]) [phone], fax Sees patients 1st and 3rd Saturday of every month.  Must not qualify for public or private insurance (i.e. Medicaid, Medicare, [place] Health Choice, Veterans' Benefits)  Household income should be no more than 200% of the poverty level The clinic cannot treat you if you are pregnant or think you are pregnant  Sexually transmitted diseases are not treated at the clinic.    Dental Care: Organization         Address  Phone  Notes  [facility] [address], [place] [phone] Accepts children up to age 38 who are enrolled in [place] or [place] Health Choice; pregnant women with a Medicaid card; and children who have applied for Medicaid or [place] Health Choice, but were declined, whose parents can pay a reduced fee at time of service.    [facility] [place]  [address], [place] [phone]) [phone] Accepts children up to age 38 who are enrolled in [place] or [place] Health Choice; pregnant women with a Medicaid card; and children who have applied for Medicaid or  Health Choice, but were declined, whose parents can pay a reduced fee at time of service.  [facility]  [address], [place] [phone] Patients are seen by appointment only. Walk-ins are not accepted. [facility] will see patients 38 years of age and older. Monday - Tuesday (8am-5pm) Most Wednesdays (8:30-5pm) $30 per visit, cash only  [facility]  [address], [place] [facility] [phone] Patients are seen by appointment only. Walk-ins are not accepted. [facility] will see patients 38 years of age and older. One Wednesday Evening (Monthly: Volunteer Based).  $30 per visit, cash only  [employer] [facility]  [phone]) [phone] for adults; Children under age 504, call Graduate Pediatric Dentistry at [phone]. Children aged 664-14, please call [phone]) [phone] to request a pediatric application.  Dental services are provided in all areas of dental care including fillings, crowns and bridges, complete and partial dentures, implants, gum treatment, root canals, and extractions. Preventive care is also provided. Treatment is provided to both adults and children. Patients are selected via a lottery and there is often a waiting list.   [facility] [address], [place]  [phone] [email or web address]   [facility] [address], [place], [place] [phone], Ext. [phone] Second and Fourth Thursday of each month, opens at 6:30 AM; Clinic ends at 9 AM.  Patients are seen on a  first-come first-served basis, and a limited number are seen during each clinic.   [facility]  [address] [place], [place] [phone]   Eligibility Requirements You must have lived in [place], [place], or [place] counties for at least the last three months.   You cannot be eligible for state or federal sponsored [place], including [employer], [place], or [employer].   You generally cannot be eligible for healthcare insurance through your employer.    How to apply: Eligibility screenings are held every Tuesday and Wednesday afternoon from 1:00 pm until 4:00 pm. You do not need an appointment for the interview!  [facility] [address] [place], [place] [phone]   [facility] Health Department  [phone]   [facility] Health Department  [phone]   [facility] Health Department  [phone]    Behavioral Health Resources in the Community: Intensive Outpatient Programs Organization         Address  Phone  Notes  [facility] [address], [place], [place] [phone]   [facility] Outpatient [address] [place], [place] [phone]   [facility] [address] [place], [place]  [phone]   [facility] [address],  [place], [place] [phone] or [phone]   Substance Abuse Resources Organization         Address  Phone  Notes  Alcohol [facility] Drug Services  [phone]   [facility]  [phone]   [facility] [place]  [phone]   Floydene Flock  [phone]   Residential & Outpatient Substance Abuse Program  [phone]   Psychological Services Organization         Address  Phone  Notes  [facility]  [phone]   [facility] Services  [phone]   [facility] [address], [place] [phone] or [phone]    Mobile Crisis Teams Organization         Address  Phone  Notes  [facility], Mobile Crisis Care Unit  [phone]   [facility]  [address] [place], [place] [phone]   Doristine Locks [address] [place] [phone]    Self-Help/Support Groups Organization         Address  Phone             Notes  [facility] [place] - variety of support groups  [phone]- I7437963 Call for more information  Narcotics Anonymous (NA), [facility] [address], [employer] [place]  2 meetings at this location   Statistician         Address  Phone  Notes  [facility] [address],    [place]  [phone]   [facility]  [address] [place] [address], [place], [place] [phone]   [facility] [address] [place], [place] [phone] Admissions: 8am-3pm M-F  [facility] [address],    [place], [place] [phone]   [facility] [address] Starling Manns [place], [place] [phone]   [facility] [address],  [place], [place] [phone]   [facility] - Intensive Outpatient [address] Laurell Josephs [address], [place], [place] [phone]   [facility] ([facility] [address]  [place], [place] [phone] or [phone]   [facility] (RTS) [address], [place], [place] [phone]  Accepts Medicaid  [facility] [address],  [place] [phone] Substance Abuse/Addiction Treatment   [facility] Organization         Address  Phone  Notes  [facility]  [phone]   Angie Fava, PhD [address] Ervin Knack [place], [place]   [phone] or [phone]   [facility] Behavioral   [address] [place], [place] [phone]   [facility] [address], [place], [place] [phone] Insurance/Medicaid/sponsorship through [facility] and Families [address]                                    [place], [place] [phone]  Therapy/tele-psych/case  [facility] [address], [place] [phone]    Dr. Lolly Mustache  [phone]   [facility]  United Way [facility] Dept. 1) [address] [place] 2) [address], [place] 3)  [address], [place] [phone]  [phone]   [facility] Child Abuse Hotline 7820067270 or 8280260918 (After Hours)

## 2013-07-15 NOTE — ED Notes (Signed)
Pt gowned. NAD

## 2013-07-15 NOTE — ED Notes (Signed)
UA collected and sent.  Pt states she thinks she has an infection

## 2013-07-16 LAB — URINE CULTURE: Colony Count: >=100000

## 2013-07-17 LAB — GC/CHLAMYDIA PROBE AMP
CT Probe RNA: NEGATIVE
GC Probe RNA: NEGATIVE

## 2013-07-17 NOTE — Progress Notes (Signed)
ED Antimicrobial Stewardship Positive Culture Follow Up   Katie Simmons is an 38 y.o. female who [facility] with a chief complaint of  Chief Complaint  Patient presents with  . Vaginal Discharge    Recent Results (from the past 720 hour(s))  URINE CULTURE     Status: None   Collection Time    07/15/13 11:33 AM      Result Value Ref Range Status   Specimen Description URINE, CLEAN CATCH   Final   Special Requests NONE   Final   Culture  Setup Time     Final   Value: 07/15/2013 13:00     Performed at [facility] Count     Final   Value: >=100,000 COLONIES/ML     Performed at [facility]   Culture     Final   Value: GROUP B STREP(S.AGALACTIAE)ISOLATED     Note: TESTING AGAINST S. AGALACTIAE NOT ROUTINELY PERFORMED DUE TO PREDICTABILITY OF AMP/PEN/VAN SUSCEPTIBILITY.     Performed at [facility]   Report Status 07/16/2013 FINAL   Final  WET PREP, GENITAL     Status: Abnormal   Collection Time    07/15/13  1:29 PM      Result Value Ref Range Status   Yeast Wet Prep HPF POC NONE SEEN  NONE SEEN Final   Trich, Wet Prep NONE SEEN  NONE SEEN Final   Clue Cells Wet Prep HPF POC NONE SEEN  NONE SEEN Final   WBC, Wet Prep HPF POC FEW (*) NONE SEEN Final    [x]  Patient discharged originally without antimicrobial agent and treatment is now indicated  57 YOF reported to the ED on 6/6 with CC of vaginal discharge, dysuria, pain/burning with intercourse. Patient has a history of chlamydia. Treated with azithromycin and ceftriaxone in the ED. Urine culture came back growing Group B Strep.   New antibiotic prescription: Amoxicillin 500mg  PO TID x 7 days  ED Provider: John Giovanni, PA   Mickey Farber 07/17/2013, 1:04 PM Infectious Diseases Pharmacist Phone# [phone]

## 2013-07-18 ENCOUNTER — Telehealth (HOSPITAL_BASED_OUTPATIENT_CLINIC_OR_DEPARTMENT_OTHER): Payer: Self-pay | Admitting: Emergency Medicine

## 2013-07-18 NOTE — Telephone Encounter (Signed)
Post ED Visit - Positive Culture Follow-up: Successful Patient Follow-Up  Culture assessed and recommendations reviewed by: []  Wes Dulaney, Pharm.D., BCPS []  Celedonio Miyamoto, Pharm.D., BCPS []  Georgina Pillion, [address].D., BCPS [x]  Fox, [place].D., BCPS, AAHIVP []  Estella Husk, Pharm.D., BCPS, AAHIVP  Positive urine culture  [x]  Patient discharged without antimicrobial prescription and treatment is now indicated []  Organism is resistant to prescribed ED discharge antimicrobial []  Patient with positive blood cultures  Changes discussed with ED provider: Jaynie Crumble PA-C New antibiotic prescription: Amoxicillin 500 mg TID x 7 days    Damaria Stofko 07/18/2013, 1:20 PM

## 2013-07-27 NOTE — Telephone Encounter (Signed)
Unable to contact patient via phone. Sent letter. °

## 2013-08-03 ENCOUNTER — Telehealth (HOSPITAL_BASED_OUTPATIENT_CLINIC_OR_DEPARTMENT_OTHER): Payer: Self-pay | Admitting: Emergency Medicine

## 2013-09-05 ENCOUNTER — Telehealth (HOSPITAL_BASED_OUTPATIENT_CLINIC_OR_DEPARTMENT_OTHER): Payer: Self-pay | Admitting: Emergency Medicine

## 2013-09-05 NOTE — Telephone Encounter (Signed)
No response to letter sent after 30 days. Chart sent to Medical Records. °

## 2015-06-28 ENCOUNTER — Ambulatory Visit: Payer: Self-pay | Attending: Internal Medicine | Admitting: Physician Assistant

## 2015-06-28 ENCOUNTER — Encounter: Payer: Self-pay | Admitting: Physician Assistant

## 2015-06-28 VITALS — BP 118/77 | HR 83 | Temp 98.0°F | Resp 16 | Ht <= 58 in | Wt 117.0 lb

## 2015-06-28 DIAGNOSIS — Z7984 Long term (current) use of oral hypoglycemic drugs: Secondary | ICD-10-CM | POA: Insufficient documentation

## 2015-06-28 DIAGNOSIS — B373 Candidiasis of vulva and vagina: Secondary | ICD-10-CM | POA: Insufficient documentation

## 2015-06-28 DIAGNOSIS — N912 Amenorrhea, unspecified: Secondary | ICD-10-CM

## 2015-06-28 DIAGNOSIS — R824 Acetonuria: Secondary | ICD-10-CM

## 2015-06-28 DIAGNOSIS — E119 Type 2 diabetes mellitus without complications: Secondary | ICD-10-CM

## 2015-06-28 DIAGNOSIS — R739 Hyperglycemia, unspecified: Secondary | ICD-10-CM

## 2015-06-28 DIAGNOSIS — R51 Headache: Secondary | ICD-10-CM | POA: Insufficient documentation

## 2015-06-28 DIAGNOSIS — Z79899 Other long term (current) drug therapy: Secondary | ICD-10-CM | POA: Insufficient documentation

## 2015-06-28 DIAGNOSIS — B3731 Acute candidiasis of vulva and vagina: Secondary | ICD-10-CM

## 2015-06-28 DIAGNOSIS — N644 Mastodynia: Secondary | ICD-10-CM | POA: Insufficient documentation

## 2015-06-28 LAB — POCT URINALYSIS DIP (MANUAL ENTRY)
BILIRUBIN UA: NEGATIVE
Leukocytes, UA: NEGATIVE
Nitrite, UA: NEGATIVE
Protein Ur, POC: NEGATIVE
SPEC GRAV UA: 1.01
UROBILINOGEN UA: 0.2
pH, UA: 6

## 2015-06-28 LAB — CBC WITH DIFFERENTIAL/PLATELET
BASOS PCT: 0 %
Basophils Absolute: 0 cells/uL (ref 0–200)
Eosinophils Absolute: 60 cells/uL (ref 15–500)
Eosinophils Relative: 1 %
HCT: 41.1 % (ref 35.0–45.0)
Hemoglobin: 14 g/dL (ref 11.7–15.5)
Lymphocytes Relative: 42 %
Lymphs Abs: 2520 cells/uL (ref 850–3900)
MCH: 32 pg (ref 27.0–33.0)
MCHC: 34.1 g/dL (ref 32.0–36.0)
MCV: 93.8 fL (ref 80.0–100.0)
MONOS PCT: 5 %
MPV: 9.9 fL (ref 7.5–12.5)
Monocytes Absolute: 300 cells/uL (ref 200–950)
NEUTROS ABS: 3120 {cells}/uL (ref 1500–7800)
Neutrophils Relative %: 52 %
PLATELETS: 253 10*3/uL (ref 140–400)
RBC: 4.38 MIL/uL (ref 3.80–5.10)
RDW: 13.1 % (ref 11.0–15.0)
WBC: 6 10*3/uL (ref 3.8–10.8)

## 2015-06-28 LAB — GLUCOSE, POCT (MANUAL RESULT ENTRY)
POC GLUCOSE: 291 mg/dL — AB (ref 70–99)
POC Glucose: 362 mg/dl — AB (ref 70–99)
POC Glucose: 274 mg/dl — AB (ref 70–99)

## 2015-06-28 LAB — POCT GLYCOSYLATED HEMOGLOBIN (HGB A1C): Hemoglobin A1C: 14

## 2015-06-28 LAB — POCT URINE PREGNANCY: Preg Test, Ur: NEGATIVE

## 2015-06-28 MED ORDER — INSULIN ASPART 100 UNIT/ML ~~LOC~~ SOLN: 10.0000 [IU] | Freq: Once | SUBCUTANEOUS | Status: DC

## 2015-06-28 MED ORDER — FLUCONAZOLE 150 MG PO TABS: 150.0000 mg | ORAL_TABLET | Freq: Once | ORAL | Status: DC

## 2015-06-28 MED ORDER — TRUE METRIX METER W/DEVICE KIT: 1.0000 | PACK | Freq: Three times a day (TID) | Status: DC

## 2015-06-28 MED ORDER — INSULIN ASPART 100 UNIT/ML ~~LOC~~ SOLN: 10.0000 [IU] | Freq: Three times a day (TID) | SUBCUTANEOUS | Status: DC

## 2015-06-28 MED ORDER — TRUEPLUS LANCETS 28G MISC: 1.0000 | Freq: Three times a day (TID) | Status: DC

## 2015-06-28 MED ORDER — GLUCOSE BLOOD VI STRP: ORAL_STRIP | Status: DC

## 2015-06-28 NOTE — Progress Notes (Signed)
Pt here for DM. Pt CBG is 291. Pt reports HA and chest pain rated at an 8 and states it is constant. Pt reports she fainted 2 days ago.

## 2015-06-28 NOTE — Patient Instructions (Addendum)
Check blood sugar fasting in the morning and record. Check blood sugar at 9pm and record.  Recuento bsico de carbohidratos para la diabetes mellitus (Basic Carbohydrate Counting for Diabetes Mellitus) El recuento de carbohidratos es un mtodo destinado a calcular la cantidad de carbohidratos en la dieta. El consumo de carbohidratos aumenta naturalmente el nivel de azcar (glucosa) en la sangre, por lo que es importante que sepa la cantidad que debe incluir en cada comida. El recuento de carbohidratos ayuda a Futures trader de glucosa en la sangre dentro de los lmites normales. La cantidad permitida de carbohidratos es diferente para cada persona. Un nutricionista puede ayudarlo a calcular la cantidad adecuada para usted. Una vez que sepa la cantidad de carbohidratos que puede consumir, podr calcular los carbohidratos de los alimentos que desea comer. Los siguientes alimentos incluyen carbohidratos:  Granos, como panes y cereales.  Frijoles secos y productos con soja.  Vegetales almidonados, como papas, guisantes y maz.  Nils Pyle y jugos de frutas.  Leche y Dentist.  Dulces y bocadillos, como pastel, galletas, caramelos, papas fritas de bolsa, refrescos y bebidas frutales con azcar. RECUENTO DE CARBOHIDRATOS [employer] de calcular los carbohidratos de los alimentos. Puede usar cualquiera de [address] o [place] combinacin de [place]. Leer la etiqueta de informacin nutricional de los alimentos envasados La informacin nutricional es una etiqueta incluida en casi todas las bebidas y los alimentos envasados de los [place]. Indica el tamao de la porcin de ese alimento o bebida e informacin sobre los nutrientes de cada porcin, incluso los gramos (g) de carbohidratos por porcin.  Decida la cantidad de porciones que comer o tomar de este alimento o bebida. Multiplique la cantidad de porciones por el nmero de gramos de carbohidratos indicados en la etiqueta para esa porcin. El  total ser la cantidad de carbohidratos que consumir al comer ese alimento o tomar esa bebida. Conocer las porciones estndar de los alimentos Cuando coma alimentos no envasados o que no incluyan la informacin nutricional en la etiqueta, deber medir las porciones para poder calcular la cantidad de carbohidratos. Una porcin de la mayora de los alimentos ricos en carbohidratos contiene alrededor de 15g de carbohidratos. La siguiente [employer] tamaos de porcin de los alimentos ricos en carbohidratos que contienen alrededor de 15g de carbohidratos por porcin:   1rebanada de pan (1oz) o 1tortilla de seis pulgadas.  panecillo de hamburguesa o bollito tipo ingls.  4a 6galletas.   de taza de cereal sin azcar y seco.   taza de cereal caliente.   de taza de arroz o pastas.  taza de pur de papas o de una papa grande al horno.  1taza de frutas frescas o una fruta pequea.  taza de frutas o jugo de frutas enlatados o congelados.  1 taza [employer].   de taza de yogur descremado sin ningn agregado o de yogur endulzado con edulcorante artificial.  taza de vegetales almidonados, como guisantes, maz o papas, o de frijoles secos cocidos. Decida la cantidad de porciones Advertising copywriter. Multiplique la cantidad de porciones por 15 (los gramos de carbohidratos en esa porcin). Por ejemplo, si come 2tazas de fresas, habr comido 2porciones y 30g de carbohidratos (2porciones x 15g = 30g). Para las comidas como sopas y guisos, en las que se mezcla ms de un alimento, deber  [place] carbohidratos de cada alimento incluido. EJEMPLO DE RECUENTO DE CARBOHIDRATOS Ejemplo de cena  3 onzas de pechugas de pollo.   de taza de  arroz integral.   taza de maz.  1 taza de [place].  1 taza de fresas con crema batida sin azcar. Clculo de carbohidratos [place] 1: Identifique los alimentos que contienen carbohidratos:   Arroz.  Maz.  Leche.  Jinny Sanders. [place]  2: Calcule el nmero de porciones que consumir de cada uno:   2 porciones de Surveyor, minerals.  1 porcin de maz.  1 porcin de leche.  1 porcin de fresas. [place] 3: Multiplique cada una de esas porciones por 15g:   2 porciones de arroz x 15 g = 30 g.  1 porcin de maz x 15 g = 15 g.  1 porcin de leche x 15 g = 15 g.  1 porcin de fresas x 15 g = 15 g. [place] 4: Sume todas las cantidades para Artist total de gramos de carbohidratos consumidos: 30 g + 15 g + 15 g + 15 g = 75 g.   Esta informacin no tiene Theme park manager el consejo del mdico. Asegrese de hacerle al mdico cualquier pregunta que tenga.   Document Released: 04/20/2011 Document Revised: 02/16/2014 Elsevier Interactive Patient Education 2016 [employer].  Control del nivel de glucosa en la sangre - Adultos (Blood Glucose Monitoring, Adult) El control de la glucosa en la sangre (tambin llamada azcar en la sangre) lo ayudar a tener la diabetes bajo control. Tambin ayuda a que usted y Lexicographer la diabetes y determinen si el tratamiento es Engineer, manufacturing. POR QU HAY QUE CONTROLAR LA GLUCOSA EN LA SANGRE?  Esto puede ayudar a comprender de United Stationers, la actividad fsica y los medicamentos inciden en los niveles de [place].  Le permite conocer el nivel de glucosa en la sangre en cualquier momento dado. Puede saber rpidamente si el nivel es bajo (hipoglucemia) o alto (hiperglucemia).  Puede ser de ayuda para que usted y el mdico sepan cmo Presenter, broadcasting,  y para entender cmo controlar una enfermedad o ajustar los medicamentos para hacer ejercicio. CUNDO DEBE HACERSE LAS PRUEBAS? El mdico lo ayudar a decidir con qu frecuencia deber [employer] niveles de glucosa en la [place]. Esto puede depender del tipo de diabetes que tenga, su control de la diabetes o los tipos de medicamentos que tome. Asegrese de anotar todos los valores de la glucosa en la [place], de modo que esta  informacin pueda ser revisada por su mdico. A continuacin puede ver ejemplos de los momentos para Education officer, environmental la prueba que el mdico puede Neurosurgeon. Diabetes tipo1  Mdaselo al menos 2 veces al da si la diabetes est bien controlada, si [place] una bomba de insulina o si se aplica muchas inyecciones diarias.  Si la diabetes no est bien controlada o si est enfermo, puede ser necesario que se controle con ms frecuencia.  Es recomendable que tambin lo mida en estas oportunidades:  Antes de cada inyeccin de insulina.  Antes y despus de hacer ejercicio.  Entre las comidas y 2horas despus de Arts administrator.  Ocasionalmente, entre las 2:00a.m. y las 3:00a.m. Diabetes tipo2  Si est utilizando insulina, realice la medicin al menos 2 veces al [employer]. Sin embargo, es Insurance claims handler medicin antes de cada inyeccin de [place].  Si toma medicamentos por boca (va oral), hgase la prueba 2veces por da.  Si sigue una dieta controlada, hgase la prueba una vez por da.  Si la diabetes no est bien controlada o si est enfermo, puede ser necesario que se controle con ms frecuencia. CMO CONTROLAR EL NIVEL  DE GLUCOSA EN LA SANGRE Insumos necesarios  Medidor de glucosa en la sangre.  Tiras reactivas para el medidor. Cada medidor tiene sus propias tiras reactivas. Debe usar las tiras reactivas correspondientes a su medidor.  Una aguja para pinchar (lanceta).  Un dispositivo que sujeta la lanceta (dispositivo de puncin).  Un diario o libro de anotaciones para YRC Worldwideanotar los resultados. Procedimiento  Lave sus manos con agua y [place]. No se recomienda usar alcohol.  Pnchese el costado del dedo (no la punta) con Optometristla lanceta.  Apriete suavemente el dedo hasta que aparezca una pequea gota de [place].  Siga las instrucciones que vienen con el medidor para Public affairs consultantinsertar la tira Firefighterreactiva, Contractoraplicar la sangre sobre la tira y usar el medidor de Horticulturist, commercialglucosa en la sangre. Otras zonas de las que se puede tomar  sangre para la prueba Algunos medidores le permiten tomar sangre para la prueba de otras zonas del cuerpo (que no son el dedo). Estas reas se llaman sitios alternativos. Los sitios alternativos ms comunes son los siguientes:  El Product managerantebrazo.  El muslo.  La zona posterior de la parte inferior de la pierna.  La palma de la mano. El flujo de sangre en estas zonas es ms lento. Por lo tanto, los valores de glucosa en la sangre que obtenga pueden estar demorados, y los nmeros son diferentes de los que obtiene de los dedos. No saque sangre de sitios alternativos si cree que tiene hipoglucemia. Los valores no sern precisos. Siempre extraiga del dedo si tiene hipoglucemia. Adems, si no puede darse cuenta cuando tiene bajos los niveles (hipoglucemia asintomtica), siempre extraiga sangre de los dedos para los controles de glucosa en la [place]. CONSEJOS ADICIONALES PARA EL CONTROL DE LA GLUCOSA  No vuelva a utilizar las lancetas.  Siempre tenga los insumos a mano.  Todos los medidores de glucosa incluyen un nmero de telfono "directo", disponible las 24 horas, al que podr llamar si tiene preguntas o French Southern Territoriesnecesita ayuda.  Ajuste (calibre) el medidor de glucosa con una solucin de control despus de terminar algunas cajas de tiras reactivas. LLEVE REGISTROS DE LOS NIVELES DE GLUCOSA EN LA SANGRE Es recomendable llevar un diario o un registro de los valores de glucosa en la [place]. La Harley-Davidsonmayora de los medidores de glucosa, sino todos, conservan el registro de la glucosa en el dispositivo. Algunos medidores permiten descargar los registros a su computadora. Llevar un registro de los valores de glucosa en la sangre es especialmente til si desea observar los patrones. Haga anotaciones simultneas con la Microbiologistlectura de los valores de glucosa en la sangre debido a que podra olvidar lo que ocurri en el momento exacto. Llevar un buen registro los ayudar a usted y al mdico a Printmakertrabajar juntos para Personnel officerlograr un buen  control de la diabetes.    Esta informacin no tiene Theme park managercomo fin reemplazar el consejo del mdico. Asegrese de hacerle al mdico cualquier pregunta que tenga.   Document Released: 01/26/2005 Document Revised: 02/16/2014 Elsevier Interactive Patient Education [employer]2016 Elsevier Inc.

## 2015-06-28 NOTE — Progress Notes (Signed)
Patient ID: Katie NajjarSilvia Micaela Tema Simmons, female   DOB: [birth year], 40 y.o.   MRN: [id number]   Katie Simmons, is a 40 y.o. female  [id number]  [id number]  DOB - [birth year]  Chief Complaint  Patient presents with  . Diabetes        Subjective:  Chief Complaint and HPI: Katie Simmons is a 40 y.o. female here today after going to a health fair and having her blood sugar checked.  It is reported that it was in the 300s and she had an A1C of 14.1.  [facility] and brought to our office for pick up.  She was instructed to come here.  History translated through [place], "[name]."  Patient has known she was diabetic for about 2 years but says she has had no means of getting her medications or follow-up.  She has occasional breast pain and headaches but denies polyuria, polydipsia, and no recent weight loss.  No FH early cardiac problems.  No vision changes.  She has not had a period in 2 months.  She does not have a glucometer at home[place]   ROS:   Constitutional:  No f/c, No night sweats, No unexplained weight loss. EENT:  No vision changes, No blurry vision, No hearing changes. No mouth, throat, or ear problems.  Respiratory: No cough, No SOB Cardiac: No CP, no palpitations GI:  No abd pain, No N/V/D. GU: No Urinary s/sx.  +vaginal itching.  No pelvic pain Musculoskeletal: No joint pain Neuro: No headache, no dizziness, no motor weakness.  Skin: No rash Endocrine:  No polydipsia. No polyuria.  Psych: Denies SI/HI  No problems updated.  ALLERGIES: No Known Allergies  PAST MEDICAL HISTORY: Past Medical History  Diagnosis Date  . Diabetes mellitus without complication     MEDICATIONS AT HOME: Prior to Admission medications   Medication Sig Start Date End Date Taking? Authorizing Provider  metFORMIN (GLUCOPHAGE) 500 MG tablet Take 1 tablet (500 mg total) by mouth 2  (two) times daily with a meal. 04/28/13  Yes Arthor CaptainAbigail Harris, PA-C  fluconazole (DIFLUCAN) 150 MG tablet Take 1 tablet (150 mg total) by mouth once. 06/28/15   Anders SimmondsAngela M Tanikka Bresnan, PA-C     Objective:  EXAM:   Filed Vitals:   06/28/15 1232  BP: 118/77  Pulse: 83  Temp: 98 F (36.7 C)  TempSrc: Oral  Resp: 16  Height: 4\' 8"  (1.422 m)  Weight: 117 lb (53.071 kg)  SpO2: 99%    General appearance : A&OX3. NAD. Non-toxic-appearing HEENT: Atraumatic and Normocephalic.  PERRLA. EOM intact.  TM clear B. Mouth-MMM, post pharynx WNL w/o erythema, No PND. Neck: supple, no JVD. No cervical lymphadenopathy. No thyromegaly Chest/Lungs:  Breathing-non-labored, Good air entry bilaterally, breath sounds normal without rales, rhonchi, or wheezing  CVS: S1 S2 regular, no murmurs, gallops, rubs  Abdomen: Bowel sounds present, Non tender and not distended with no gaurding, rigidity or rebound. Extremities: Bilateral Lower Ext shows no edema, both legs are warm to touch with = pulse throughout Neurology:  CN II-XII grossly intact, Non focal.   Psych:  TP linear. J/I WNL. Normal speech. Appropriate eye contact and affect.  Skin:  No Rash  Data Review Blood sugar 291 at beginning of office visit 40 ketones inurine dip Negative urine pregnancy  22guage IV started at 1:05pm 1L NS hung 1:45 10 units Novolog given SQ 2:00pm IV completed 2:15pm-cbg  274   Assessment & Plan   1. Type 2 diabetes mellitus without complication, without long-term current use of insulin (HCC) - Hemoglobin A1c - Comprehensive metabolic panel - CBC with Differential/Platelet Take metformin  bid and will titrate up in 2 weeks.  Increase water intake.  Check fasting sugar and 9pm sugar and record and bring back in 2 weeks.   Glucometer and strips ordered I have had a lengthy discussion and provided education about the intake of too much sugar/refined carbohydrates esp in the setting of uncontrolled diabetes.  I have  advised the patient to work at a goal of eliminating sugary drinks, candy, desserts, sweets, refined sugars, processed foods, and white carbohydrates.  The patient expresses understanding.    2. Amenorrhea Negative pregnancy - POCT urine pregnancy  3. Hyperglycemia - POCT urinalysis dipstick - POCT glucose (manual entry)  4. Yeast vaginitis Due to uncontrolled diabetes - fluconazole (DIFLUCAN) 150 MG tablet; Take 1 tablet (150 mg total) by mouth once.  Dispense: 1 tablet; Refill: 0  5. Ketonuria IV fluid-1 L NS 10 units regular insulin (Novolog) at 1:45pm   Patient have been counseled extensively about nutrition and exercise  Return in about 2 weeks (around 07/12/2015) for appt with Kennyth Arnold and with provider to establish as PCP.  The patient was given clear instructions to go to ER or return to medical center if symptoms don't improve, worsen or new problems develop. The patient verbalized understanding. The patient was told to call to get lab results if they haven't heard anything in the next week.   Georgian Co, PA-C [facility] [place], [place] [phone]   06/28/2015, 1:12 PM

## 2015-06-29 LAB — COMPREHENSIVE METABOLIC PANEL
ALBUMIN: 3.6 g/dL (ref 3.6–5.1)
ALT: 27 U/L (ref 6–29)
AST: 24 U/L (ref 10–30)
Alkaline Phosphatase: 138 U/L — ABNORMAL HIGH (ref 33–115)
BUN: 7 mg/dL (ref 7–25)
CALCIUM: 7.8 mg/dL — AB (ref 8.6–10.2)
CHLORIDE: 105 mmol/L (ref 98–110)
CO2: 24 mmol/L (ref 20–31)
CREATININE: 0.37 mg/dL — AB (ref 0.50–1.10)
Glucose, Bld: 295 mg/dL — ABNORMAL HIGH (ref 65–99)
Potassium: 3.3 mmol/L — ABNORMAL LOW (ref 3.5–5.3)
SODIUM: 137 mmol/L (ref 135–146)
TOTAL PROTEIN: 6.3 g/dL (ref 6.1–8.1)
Total Bilirubin: 0.5 mg/dL (ref 0.2–1.2)

## 2015-06-29 LAB — HEMOGLOBIN A1C
Hgb A1c MFr Bld: 14.1 % — ABNORMAL HIGH (ref <5.7)
Mean Plasma Glucose: 358 mg/dL

## 2015-07-01 ENCOUNTER — Other Ambulatory Visit: Payer: Self-pay | Admitting: Physician Assistant

## 2015-07-01 MED ORDER — POTASSIUM CHLORIDE CRYS ER 20 MEQ PO TBCR: 20.0000 meq | EXTENDED_RELEASE_TABLET | Freq: Every day | ORAL | Status: DC

## 2015-07-11 ENCOUNTER — Encounter: Payer: Self-pay | Admitting: Pharmacist

## 2015-07-11 ENCOUNTER — Ambulatory Visit: Payer: Self-pay | Attending: Internal Medicine | Admitting: Pharmacist

## 2015-07-11 DIAGNOSIS — E119 Type 2 diabetes mellitus without complications: Secondary | ICD-10-CM

## 2015-07-11 LAB — POCT URINALYSIS DIPSTICK
Bilirubin, UA: NEGATIVE
GLUCOSE UA: 500
Ketones, UA: NEGATIVE
Leukocytes, UA: NEGATIVE
Nitrite, UA: NEGATIVE
Protein, UA: NEGATIVE
RBC UA: NEGATIVE
SPEC GRAV UA: 1.015
UROBILINOGEN UA: 0.2
pH, UA: 7.5

## 2015-07-11 LAB — GLUCOSE, POCT (MANUAL RESULT ENTRY)
POC Glucose: 434 mg/dL — AB (ref 70–99)
POC Glucose: 502 mg/dL — AB (ref 70–99)

## 2015-07-11 MED ORDER — INSULIN ASPART 100 UNIT/ML ~~LOC~~ SOLN
20.0000 [IU] | Freq: Once | SUBCUTANEOUS | Status: AC
  Administered 2015-07-11: 20 [IU] via SUBCUTANEOUS

## 2015-07-11 MED ORDER — METFORMIN HCL 500 MG PO TABS: 1000.0000 mg | ORAL_TABLET | Freq: Two times a day (BID) | ORAL | Status: DC

## 2015-07-11 MED ORDER — GLIPIZIDE ER 5 MG PO TB24
5.0000 mg | ORAL_TABLET | Freq: Every day | ORAL | Status: DC
Start: 2015-07-11 — End: 2017-08-20

## 2015-07-11 NOTE — Addendum Note (Signed)
Addended by: Juanita CraverKARL, Grasiela Jonsson A on: 07/11/2015 04:45 PM   Modules accepted: Orders

## 2015-07-11 NOTE — Progress Notes (Signed)
S:    Patient arrives in good spirits.  Presents for diabetes evaluation, education, and management at the request of Georgian Congela McClung. Patient was referred on 06/28/15. Patient was Spanish-speaking and a Public house manageracific Virtual Interpreter was used for the duration of the visit (Abby [phone]; Silvestre MesiCristina [phone]).   Patient reports Diabetes was diagnosed recently after going to a health fair and finding out that her blood glucose was in the 300s.  Patient reports adherence with medications.  Current diabetes medications include: metformin 500 mg BID  Patient denies hypoglycemic events.  Patient reported dietary habits: Eats 3 meals a day and does not understand what carbohydrates are.   Patient reported exercise habits: none   Patient reports nocturia.  Patient reports neuropathy. Patient denies visual changes. Patient reports self foot exams.    O:  Lab Results  Component Value Date   HGBA1C 14.1* 06/28/2015   There were no vitals filed for this visit.  Home fasting CBG: 200s-300s 2 hour post-prandial/random CBG: 400s-500s  POCT glucose: 502;434  A/P: Diabetes newly diagnosed currently UNcontrolled based on A1c of 14.1 and home CBGs. Patient denies hypoglycemic events and is able to verbalize appropriate hypoglycemia management plan. Patient reports adherence with medication. Control is suboptimal due to dietary indiscretion and sedentary lifestyle.  Administered Novolog 20 units x 1. Urine negative for ketones.  Discussed adding insulin with the patient due to her greatly elevated A1c. She is not willing to use insulin at this time. Still waiting to see financial counseling [facility]. Will increase metformin to 1000 mg BID and start glipizide XL 5 mg daily. Patient to follow up and if blood glucose not improving, will revisit the use of insulin.  Also provided detailed diabetes education, including blood glucose monitoring, blood glucose goals, basic carbohydrate counseling, and  lifestyle modifications. Encouraged patient to decrease carbohydrate intake and to increase exercise to help lower her blood glucose and to avoid complications of diabetes.  Next A1C anticipated August 2017.    Written patient instructions provided.  Total time in face to face counseling 30 minutes.  Follow up in Pharmacist Clinic Visit in 2 weeks if unable to be seen by new primary care provider by that time.  Patient seen with Elta GuadeloupeJustin Crowder, PharmD Candidate

## 2015-07-11 NOTE — Patient Instructions (Signed)
Thanks for coming to see me!  Schedule an appointment with a new PCP in 2 weeks  Come back to see me in 2 weeks if you cannot get in with a new PCP  Recuento bsico de carbohidratos para la diabetes mellitus (Basic Carbohydrate Counting for Diabetes Mellitus) El recuento de carbohidratos es un mtodo destinado a calcular la cantidad de carbohidratos en la dieta. El consumo de carbohidratos aumenta naturalmente el nivel de azcar (glucosa) en la sangre, por lo que es importante que sepa la cantidad que debe incluir en cada comida. El recuento de carbohidratos ayuda a Futures tradermantener el nivel de glucosa en la sangre dentro de los lmites normales. La cantidad permitida de carbohidratos es diferente para cada persona. Un nutricionista puede ayudarlo a calcular la cantidad adecuada para usted. Una vez que sepa la cantidad de carbohidratos que puede consumir, podr calcular los carbohidratos de los alimentos que desea comer. Los siguientes alimentos incluyen carbohidratos:  Granos, como panes y cereales.  Frijoles secos y productos con soja.  Vegetales almidonados, como papas, guisantes y maz.  Nils PyleFrutas y jugos de frutas.  Leche y Dentistyogur.  Dulces y bocadillos, como pastel, galletas, caramelos, papas fritas de bolsa, refrescos y bebidas frutales con azcar. RECUENTO DE CARBOHIDRATOS Toys ''R'' UsHay dos maneras de calcular los carbohidratos de los alimentos. Puede usar cualquiera de 1 Kamani Stlos dos mtodos o [place] combinacin de [place]. Leer la etiqueta de informacin nutricional de los alimentos envasados La informacin nutricional es una etiqueta incluida en casi todas las bebidas y los alimentos envasados de los MarksEstados Unidos. Indica el tamao de la porcin de ese alimento o bebida e informacin sobre los nutrientes de cada porcin, incluso los gramos (g) de carbohidratos por porcin.  Decida la cantidad de porciones que comer o tomar de este alimento o bebida. Multiplique la cantidad de porciones por el nmero de gramos  de carbohidratos indicados en la etiqueta para esa porcin. El total ser la cantidad de carbohidratos que consumir al comer ese alimento o tomar esa bebida. Conocer las porciones estndar de los alimentos Cuando coma alimentos no envasados o que no incluyan la informacin nutricional en la etiqueta, deber medir las porciones para poder calcular la cantidad de carbohidratos. Una porcin de la mayora de los alimentos ricos en carbohidratos contiene alrededor de 15g de carbohidratos. La siguiente World Fuel Services Corporationlista incluye los tamaos de porcin de los alimentos ricos en carbohidratos que contienen alrededor de 15g de carbohidratos por porcin:   1rebanada de pan (1oz) o 1tortilla de seis pulgadas.  panecillo de hamburguesa o bollito tipo ingls.  4a 6galletas.   de taza de cereal sin azcar y seco.   taza de cereal caliente.   de taza de arroz o pastas.  taza de pur de papas o de una papa grande al horno.  1taza de frutas frescas o una fruta pequea.  taza de frutas o jugo de frutas enlatados o congelados.  1 taza AutoZonede leche.   de taza de yogur descremado sin ningn agregado o de yogur endulzado con edulcorante artificial.  taza de vegetales almidonados, como guisantes, maz o papas, o de frijoles secos cocidos. Decida la cantidad de porciones Advertising copywriterestndar que comer. Multiplique la cantidad de porciones por 15 (los gramos de carbohidratos en esa porcin). Por ejemplo, si come 2tazas de fresas, habr comido 2porciones y 30g de carbohidratos (2porciones x 15g = 30g). Para las comidas como sopas y guisos, en las que se mezcla ms de un alimento, deber [place] Northern Santa Fecontar los carbohidratos de cada alimento incluido. EJEMPLO  DE RECUENTO DE CARBOHIDRATOS Ejemplo de cena  3 onzas de pechugas de pollo.   de taza de arroz integral.   taza de maz.  1 taza de [place].  1 taza de fresas con crema batida sin azcar. Clculo de carbohidratos [place] 1: Identifique los alimentos que contienen  carbohidratos:   Arroz.  Maz.  Leche.  Jinny Sanders. [place] 2: Calcule el nmero de porciones que consumir de cada uno:   2 porciones de Surveyor, minerals.  1 porcin de maz.  1 porcin de leche.  1 porcin de fresas. [place] 3: Multiplique cada una de esas porciones por 15g:   2 porciones de arroz x 15 g = 30 g.  1 porcin de maz x 15 g = 15 g.  1 porcin de leche x 15 g = 15 g.  1 porcin de fresas x 15 g = 15 g. [place] 4: Sume todas las cantidades para Artist total de gramos de carbohidratos consumidos: 30 g + 15 g + 15 g + 15 g = 75 g.   Esta informacin no tiene Theme park manager el consejo del mdico. Asegrese de hacerle al mdico cualquier pregunta que tenga.   Document Released: 04/20/2011 Document Revised: 02/16/2014 Elsevier Interactive Patient Education 2016 [employer].  Control del nivel de glucosa en la sangre - Adultos (Blood Glucose Monitoring, Adult) El control de la glucosa en la sangre (tambin llamada azcar en la sangre) lo ayudar a tener la diabetes bajo control. Tambin ayuda a que usted y Lexicographer la diabetes y determinen si el tratamiento es Engineer, manufacturing. POR QU HAY QUE CONTROLAR LA GLUCOSA EN LA SANGRE?  Esto puede ayudar a comprender de United Stationers, la actividad fsica y los medicamentos inciden en los niveles de [place].  Le permite conocer el nivel de glucosa en la sangre en cualquier momento dado. Puede saber rpidamente si el nivel es bajo (hipoglucemia) o alto (hiperglucemia).  Puede ser de ayuda para que usted y el mdico sepan cmo Presenter, broadcasting,  y para entender cmo controlar una enfermedad o ajustar los medicamentos para hacer ejercicio. CUNDO DEBE HACERSE LAS PRUEBAS? El mdico lo ayudar a decidir con qu frecuencia deber [employer] niveles de glucosa en la [place]. Esto puede depender del tipo de diabetes que tenga, su control de la diabetes o los tipos de medicamentos que tome. Asegrese de anotar todos  los valores de la glucosa en la [place], de modo que esta informacin pueda ser revisada por su mdico. A continuacin puede ver ejemplos de los momentos para Education officer, environmental la prueba que el mdico puede Neurosurgeon. Diabetes tipo1  Mdaselo al menos 2 veces al da si la diabetes est bien controlada, si [place] una bomba de insulina o si se aplica muchas inyecciones diarias.  Si la diabetes no est bien controlada o si est enfermo, puede ser necesario que se controle con ms frecuencia.  Es recomendable que tambin lo mida en estas oportunidades:  Antes de cada inyeccin de insulina.  Antes y despus de hacer ejercicio.  Entre las comidas y 2horas despus de Arts administrator.  Ocasionalmente, entre las 2:00a.m. y las 3:00a.m. Diabetes tipo2  Si est utilizando insulina, realice la medicin al menos 2 veces al [employer]. Sin embargo, es Insurance claims handler medicin antes de cada inyeccin de [place].  Si toma medicamentos por boca (va oral), hgase la prueba 2veces por da.  Si sigue una dieta controlada, hgase la prueba una vez por da.  Si la diabetes no est  bien controlada o si est enfermo, puede ser necesario que se controle con ms frecuencia. CMO CONTROLAR EL NIVEL DE GLUCOSA EN LA SANGRE Insumos necesarios  Medidor de glucosa en la sangre.  Tiras reactivas para el medidor. Cada medidor tiene sus propias tiras reactivas. Debe usar las tiras reactivas correspondientes a su medidor.  Una aguja para pinchar (lanceta).  Un dispositivo que sujeta la lanceta (dispositivo de puncin).  Un diario o libro de anotaciones para [employer]. Procedimiento  Lave sus manos con agua y [place]. No se recomienda usar alcohol.  Pnchese el costado del dedo (no la punta) con Optometrist.  Apriete suavemente el dedo hasta que aparezca una pequea gota de [place].  Siga las instrucciones que vienen con el medidor para Public affairs consultant tira Firefighter, Contractor la sangre sobre la tira y usar el medidor de  Horticulturist, commercial. Otras zonas de las que se puede tomar sangre para la prueba Algunos medidores le permiten tomar sangre para la prueba de otras zonas del cuerpo (que no son el dedo). Estas reas se llaman sitios alternativos. Los sitios alternativos ms comunes son los siguientes:  El Product manager.  El muslo.  La zona posterior de la parte inferior de la pierna.  La palma de la mano. El flujo de sangre en estas zonas es ms lento. Por lo tanto, los valores de glucosa en la sangre que obtenga pueden estar demorados, y los nmeros son diferentes de los que obtiene de los dedos. No saque sangre de sitios alternativos si cree que tiene hipoglucemia. Los valores no sern precisos. Siempre extraiga del dedo si tiene hipoglucemia. Adems, si no puede darse cuenta cuando tiene bajos los niveles (hipoglucemia asintomtica), siempre extraiga sangre de los dedos para los controles de glucosa en la [place]. CONSEJOS ADICIONALES PARA EL CONTROL DE LA GLUCOSA  No vuelva a utilizar las lancetas.  Siempre tenga los insumos a mano.  Todos los medidores de glucosa incluyen un nmero de telfono "directo", disponible las 24 horas, al que podr llamar si tiene preguntas o [place].  Ajuste (calibre) el medidor de glucosa con una solucin de control despus de terminar algunas cajas de tiras reactivas. LLEVE REGISTROS DE LOS NIVELES DE GLUCOSA EN LA SANGRE Es recomendable llevar un diario o un registro de los valores de glucosa en la [place]. La Harley-[employer]son de los medidores de glucosa, sino todos, conservan el registro de la glucosa en el dispositivo. Algunos medidores permiten descargar los registros a su computadora. Llevar un registro de los valores de glucosa en la sangre es especialmente til si desea observar los patrones. Haga anotaciones simultneas con la Microbiologist de los valores de glucosa en la sangre debido a que podra olvidar lo que ocurri en el momento exacto. Llevar un buen registro los ayudar a  usted y al mdico a Printmaker juntos para Personnel officer un buen control de la diabetes.    Esta informacin no tiene Theme park manager el consejo del mdico. Asegrese de hacerle al mdico cualquier pregunta que tenga.   Document Released: 01/26/2005 Document Revised: 02/16/2014 Elsevier Interactive Patient Education [employer]  Diabetes y actividad fsica (Diabetes and Exercise) Hacer actividad fsica con regularidad es muy importante. No se trata solo de [employer]. Tiene muchos otros beneficios, como por ejemplo:  Mejorar el estado fsico, la flexibilidad y la resistencia.  Aumenta la densidad sea.  Ayuda a Art gallery manager.  Disminuye la Art gallery manager.  Aumenta la fuerza muscular.  Reduce el estrs y las  tensiones.  Mejora el estado de salud general. Las personas diabticas que realizan actividad fsica tienen beneficios adicionales debido al ejercicio:  Reduce el apetito.  El organismo mejora el uso del azcar (glucosa) de la [place].  Ayuda a disminuir o Engineer, maintenance (IT).  Disminuye la presin arterial.  Ayuda a disminuir los lpidos en la sangre (colesterol y triglicridos).  El organismo mejora el uso de la insulina porque:  Aumenta la sensibilidad del organismo a la insulina.  Reduce las necesidades de insulina del organismo.  Disminuye el riesgo de enfermedad cardaca por la actividad fsica ya que  disminuye el colesterol y [employer] triglicridos.  Aumenta los niveles de colesterol bueno (como las lipoprotenas de alta densidad [HDL]) en el organismo.  Disminuye los niveles de glucosa en la [place]. SU PLAN DE ACTIVIDAD  Elija una actividad que disfrute y establezca objetivos realistas. Para ejercitarse sin riesgos, debe comenzar a Education administrator cualquier actividad fsica nueva lentamente y aumentar la intensidad del ejercicio de forma gradual con el tiempo. Su mdico o educador en diabetes podrn ayudarlo a crear un plan de  actividades que lo beneficie. Las recomendaciones generales incluyen lo siguiente:  Air cabin crew a los nios para que realicen al menos 60 minutos de actividad fsica Management consultant.  Estirarse y Education officer, environmental ejercicios de entrenamiento de la fuerza, como yoga o levantamiento de pesas, por lo menos 2 veces por semana.  Realizar en total por lo menos 150 minutos de ejercicios de intensidad moderada cada semana, como caminar a paso ligero o hacer gimnasia acutica.  Hacer ejercicio fsico por lo menos 3 das por semana y no dejar pasar ms de 2 das seguidos sin ejercitarse.  Evitar los perodos largos de inactividad (90 minutos o ms tiempo). Cuando deba pasar mucho tiempo sentado, haga pausas frecuentes para caminar o estirarse. RECOMENDACIONES PARA REALIZAR EJERCICIOS CUANDO SE TIENE DIABETES TIPO 1 O TIPO 2   Controle la glucosa en la sangre antes de comenzar. Si el nivel de glucosa en la sangre es de ms de 240 mg/dl, controle las cetonas en la [place]. No haga actividad fsica si hay cetonas.  Evite inyectarse insulina en las zonas del cuerpo que ejercitar. Por ejemplo, evite inyectarse insulina en:  Los brazos, si juega al tenis.  Las piernas, si corre.  Lleve un registro de:  Los alimentos que consume antes y despus de Tour manager.  Los momentos esperables de picos de accin de la insulina.  Los niveles de glucosa en la sangre antes y despus de hacer ejercicios.  El tipo y cantidad de [place] fsica que Biomedical engineer.  Revise los registros con su mdico. El mdico lo ayudar a Environmental education officer pautas para ajustar la cantidad de alimento y las cantidades de insulina antes y despus de Radio producer ejercicios.  Si toma insulina o agentes hipoglucemiantes por va oral, observe si hay signos y sntomas de hipoglucemia. Entre los que se incluyen:  Mareos.  Temblores.  Sudoracin.  Escalofros.  Confusin.  Beba gran cantidad de agua mientras hace ejercicios para evitar la deshidratacin o los  golpes de Airline pilot. Durante la actividad fsica se pierde agua corporal que se debe reponer.  Comente con su mdico antes de comenzar un programa de actividad fsica para verificar que sea seguro para usted. Recuerde, cualquier actividad es mejor que ninguna.   Esta informacin no tiene Theme park manager el consejo del mdico. Asegrese de hacerle al mdico cualquier pregunta que tenga.   Document Released: 02/15/2007 Document Revised: 06/12/2014 Elsevier Interactive  Patient Education 2016 [employer].  La diabetes mellitus y los alimentos (Diabetes Mellitus and Food) Es importante que controle su nivel de azcar en la sangre (glucosa). El nivel de glucosa en sangre depende en gran medida de lo que usted come. Comer alimentos saludables en las cantidades [place] a lo largo del Futures trader, aproximadamente a la misma hora [employer], lo ayudar a Chief Operating Officer su nivel de Event organiser. Tambin puede ayudarlo a retrasar o Fish farm manager de la diabetes mellitus. Comer de [employer] saludable incluso puede ayudarlo a Event organiser de presin arterial y a Barista o Pharmacologist un peso saludable.  Entre las recomendaciones generales para alimentarse y Water quality scientist los alimentos de forma saludable, se incluyen las siguientes:  Respetar las comidas principales y comer colaciones con regularidad. Evitar pasar largos perodos sin comer con el fin de perder peso.  Seguir una dieta que consista principalmente en alimentos de origen vegetal, como frutas, vegetales, frutos secos, legumbres y cereales integrales.  Utilizar mtodos de coccin a baja temperatura, como hornear, en lugar de mtodos de coccin a alta temperatura, como frer en abundante aceite. Trabaje con el nutricionista para aprender a Acupuncturist nutricional de las etiquetas de los alimentos. CMO PUEDEN AFECTARME LOS ALIMENTOS? Carbohidratos Los carbohidratos afectan el nivel de glucosa en sangre ms que cualquier otro tipo de  alimento. El nutricionista lo ayudar a Chief Strategy Officer cuntos carbohidratos puede consumir en cada comida y ensearle a contarlos. El recuento de carbohidratos es importante para mantener la glucosa en sangre en un nivel saludable, en especial si utiliza insulina o toma determinados medicamentos para la diabetes mellitus. Alcohol El alcohol puede provocar disminuciones sbitas de la glucosa en sangre (hipoglucemia), en especial si utiliza insulina o toma determinados medicamentos para la diabetes mellitus. La hipoglucemia es una afeccin que puede poner en peligro la vida. Los sntomas de la hipoglucemia (somnolencia, mareos y Administrator) son similares a los sntomas de haber consumido mucho alcohol.  Si el mdico lo autoriza a beber alcohol, hgalo con moderacin y siga estas pautas:  Las mujeres no deben beber ms de un trago por da, y los hombres no deben beber ms de dos tragos por Futures trader. Un trago es igual a:  12 onzas (355 ml) de cerveza  5 onzas de vino (150 ml) de vino  1,5onzas (45ml) de bebidas espirituosas  No beba con el estmago vaco.  Mantngase hidratado. Beba agua, gaseosas dietticas o t helado sin azcar.  Las gaseosas comunes, los jugos y otros refrescos podran contener muchos carbohidratos y se Heritage manager. QU ALIMENTOS NO SE RECOMIENDAN? Cuando haga las elecciones de alimentos, es importante que recuerde que todos los alimentos son distintos. Algunos tienen menos nutrientes que otros por porcin, aunque podran tener la misma cantidad de caloras o carbohidratos. Es difcil darle al cuerpo lo que necesita cuando consume alimentos con menos nutrientes. Estos son algunos ejemplos de alimentos que debera evitar ya que contienen muchas caloras y carbohidratos, pero pocos nutrientes:  Neurosurgeon trans (la mayora de los alimentos procesados incluyen grasas trans en la etiqueta de Informacin nutricional).  Gaseosas comunes.  Jugos.  Caramelos.  Dulces, como tortas,  pasteles, rosquillas y [place].  Comidas fritas. QU ALIMENTOS PUEDO COMER? Consuma alimentos ricos en nutrientes, que nutrirn el cuerpo y lo mantendrn saludable. Los alimentos que debe comer tambin dependern de varios factores, como:  Las caloras que necesita.  Los medicamentos que toma.  Su peso.  El nivel de glucosa en [place].  El [place] de  presin arterial.  El nivel de colesterol. Debe consumir una amplia variedad de alimentos, por ejemplo:  Protenas.  Cortes de [employer] Corporation.  Protenas con bajo contenido de grasas saturadas, como pescado, clara de huevo y frijoles. Evite las carnes procesadas.  Frutas y vegetales.  Frutas y Sports administrator que pueden ayudar a Chief Operating Officer los niveles sanguneos de [place], como [place], mangos y batatas.  Productos lcteos.  Elija productos lcteos sin grasa o con bajo contenido de [place], como [place], yogur y [place].  Cereales, panes, pastas y arroz.  Elija cereales integrales, como panes multicereales, avena en grano y arroz integral. Estos alimentos pueden ayudar a controlar la presin arterial.  Rosalin Hawking.  Alimentos que contengan grasas saludables, como frutos secos, Chartered certified accountant, aceite de [place], aceite de canola y pescado. TODOS LOS QUE PADECEN DIABETES MELLITUS TIENEN EL MISMO PLAN DE COMIDAS? Dado que todas las personas que padecen diabetes mellitus son distintas, no hay un solo plan de comidas que funcione para todos. Es muy importante que se rena con un nutricionista que lo ayudar a crear un plan de comidas adecuado para usted.   Esta informacin no tiene Theme park manager el consejo del mdico. Asegrese de hacerle al mdico cualquier pregunta que tenga.   Document Released: 05/05/2007 Document Revised: 02/16/2014 Elsevier Interactive Patient Education 2016 [employer].  Diabetes mellitus tipo2, adultos (Type 2 Diabetes Mellitus, Adult) La diabetes mellitus tipo2, generalmente denominada diabetes tipo2, es una  enfermedad prolongada (crnica). En la diabetes tipo2, el pncreas no produce suficiente insulina (una hormona), las clulas son menos sensibles a la insulina que se produce (resistencia a la insulina), o ambos. Normalmente, la Johnson [employer] azcares de los alimentos a las clulas de los tejidos. Las clulas de los tejidos [employer] Corporation azcares para Psychiatrist. La falta de insulina o la falta de una respuesta normal a la insulina hace que el exceso de azcar se acumule en la sangre en lugar de Customer service manager en las clulas de los tejidos. Como resultado, se producen niveles altos de Banker (hiperglucemia). El [address] de los niveles altos de azcar (glucosa) puede causar muchas complicaciones.  La diabetes tipo2 antes tambin se denominaba diabetes del [place], pero puede ocurrir a Actuary.  FACTORES DE RIESGO  Una persona tiene mayor predisposicin a desarrollar diabetes tipo 2 si alguien en su familia tiene la enfermedad y tambin tiene uno o ms de los siguientes factores de riesgo principales:  Aumento de [place], sobrepeso u obesidad.  Estilo de vida sedentario.  Una historia de consumo constante de alimentos de altas caloras. Mantener un peso saludable y realizar actividad fsica regular puede reducir la probabilidad de desarrollar diabetes tipo 2. SNTOMAS  Una persona con diabetes tipo 2 no presenta sntomas en un principio. Los sntomas de la diabetes tipo 2 aparecen lentamente. Los sntomas son:  Aumento de la sed (polidipsia).  Aumento de la miccin (poliuria).  Orina con ms frecuencia durante la noche (nocturia).  Cambios repentinos en el peso o sin motivo aparente.  Infecciones frecuentes y recurrentes.  Cansancio (fatiga).  Debilidad.  Cambios en la visin, como visin borrosa.  Olor a Water quality scientist.  Dolor abdominal.  Nuseas o vmitos.  Cortes o moretones que tardan en sanarse.  Hormigueo o adormecimiento de las manos y los  pies.  Una herida abierta en la piel (lcera). DIAGNSTICO Con frecuencia la diabetes tipo 2 no se diagnostica hasta que se presentan las complicaciones de la diabetes. La diabetes tipo 2 se diagnostica cuando  los sntomas o las complicaciones se presentan y cuando aumentan los niveles de glucosa en la [place]. El nivel de glucosa en la sangre puede controlarse en uno o ms de los siguientes anlisis de sangre:  Medicin de glucosa en la sangre en [place]. No se le permitir comer durante al menos 8 horas antes de que se tome [place] de [place].  Pruebas al azar de glucosa en la sangre. El nivel de glucosa en la sangre se controla en cualquier momento del da sin importar el momento en que haya comido.  Prueba de A1c (hemoglobina glucosilada) Una prueba de A1c proporciona informacin sobre el control de la glucosa en la sangre durante los ltimos 3 meses.  Prueba de tolerancia a la glucosa oral (PTGO). La glucosa en la sangre se mide despus de no haber comido (ayunas) durante dos horas y despus de beber una bebida que contenga glucosa. TRATAMIENTO   Usted puede necesitar administrarse insulina o medicamentos para la diabetes todos los das para [employer] niveles de glucosa en la sangre en el rango deseado.  Si [place] insulina, tal vez necesite ajustar la dosis segn los carbohidratos que haya consumido en cada comida o colacin.  [employer] del tratamiento, se recomienda hacer cambios en el estilo de vida. Estos pueden incluir lo siguiente:  Customer service manager de alimentacin personalizado elaborado por un nutricionista.  Hacer ejercicio fsico a diario. El mdico establecer los objetivos personalizados del tratamiento para usted en funcin de su edad, los medicamentos que toma, el tiempo que hace que tiene diabetes y cualquier otra enfermedad que padezca. Generalmente, el objetivo del tratamiento es [employer] siguientes niveles sanguneos de glucosa:  Antes de las comidas (preprandial):  de 80 a 130 mg/dl.  Antes de las comidas (preprandial): de 80 a 130 mg/dl.  A1c: menos del 6,5 % al 7 %. INSTRUCCIONES PARA EL CUIDADO EN EL HOGAR   Controle su nivel de hemoglobina A1c dos veces al ao.  Contrlese a diario Air cabin crew de glucosa en la sangre segn las indicaciones de su mdico.  Supervise las cetonas en la orina cuando est enferma y segn las indicaciones de su mdico.  Tome el medicamento para la diabetes o adminstrese insulina segn las indicaciones de su mdico para Radio producer nivel de glucosa en la sangre en el rango deseado.  Nunca se quede sin medicamento para la diabetes o sin insulina. Es necesario que la reciba [employer].  Si [place] insulina, tal vez deba ajustar la cantidad de insulina administrada segn los carbohidratos consumidos. Los hidratos de carbono pueden aumentar los niveles de glucosa en la sangre, pero deben incluirse en su dieta. Los hidratos de carbono aportan vitaminas, minerales y [place] que son [place] parte esencial de una dieta saludable. Los hidratos de carbono se encuentran en frutas, verduras, cereales integrales, productos lcteos, legumbres y alimentos que contienen azcares aadidos.  Consuma alimentos saludables. Programe una cita con un nutricionista certificado para que lo ayude a Chief Executive Officer de alimentacin adecuado para usted.  Baje de peso si es necesario.  Lleve una tarjeta de alerta mdica o use una pulsera o medalla de alerta mdica.  Lleve con usted una colacin de 15gramos de hidratos de carbono en todo momento para controlar los niveles bajos de glucosa en la sangre (hipoglucemia). Algunos ejemplos de colaciones de 15gramos de hidratos de carbono son los siguientes:  Tabletas de glucosa, 3 o 4.  Gel de glucosa, tubo de 15 gramos.  Pasas de uva, 2  cucharadas (24 gramos).  Caramelos de goma, 6.  Galletas de [place], 8.  Gaseosa comn, 4onzas ( ).  Pastillas de goma, 9.  Reconocer la  hipoglucemia. La hipoglucemia se produce cuando los niveles de glucosa en la sangre son de 70 mg/dl o menos. El riesgo de hipoglucemia aumenta durante el ayuno o cuando se saltea las comidas, durante o despus de Education officer, environmental ejercicio intenso y [place] duerme. Los sntomas de hipoglucemia son:  Temblores o sacudidas.  Disminucin de la capacidad de concentracin.  Sudoracin.  Aumento de la frecuencia cardaca.  Dolor de [place].  Sequedad en la boca.  Hambre.  Irritabilidad.  Ansiedad.  Sueo agitado.  Alteracin del habla o de la coordinacin.  Confusin.  Tratar la hipoglucemia rpidamente. Si usted est alerta y puede tragar con seguridad, siga la regla de 15/15 que consiste en:  [employer] 15 y 20gramos de glucosa de accin rpida o carbohidratos. Las opciones de accin rpida son un gel de glucosa, tabletas de glucosa, o 4 onzas (120 ml) de jugo de frutas, gaseosa comn, o leche baja en grasa.  Compruebe su nivel de glucosa en la sangre 15 minutos despus de tomar la glucosa.  Tome entre 15 y 20 gramos ms de glucosa si el nivel de glucosa en la sangre todava es de 70mg /dl o inferior.  Ingiera una comida o una colacin en el lapso de 1 hora una vez que los niveles de glucosa en la sangre vuelven a la normalidad.  Est atento a si siente mucha sed u orina con mayor frecuencia, porque son signos tempranos de hiperglucemia. El reconocimiento temprano de la hiperglucemia permite un tratamiento oportuno. Trate la hiperglucemia segn le indic su mdico.  Haga, al menos, de actividad fsica moderada a la semana, distribuidos en, por lo menos, 3das a la semana o como lo indique su mdico. Adems, debe realizar ejercicios de resistencia por lo menos 2veces a la semana o como lo indique su mdico. Trate de no permanecer inactivo durante ms de seguidos.  Ajuste su medicamento y la ingesta de alimentos, segn sea necesario, si inicia un nuevo ejercicio o  deporte.  Siga su plan para los [address] de enfermedad cuando no puede comer o beber como de [place].  No consuma ningn producto que contenga tabaco, como cigarrillos, tabaco de Theatre manager o Administrator, Civil Service. Si necesita ayuda para dejar de fumar, hable con el mdico.  Limite el consumo de alcohol a no ms de 1 medida por da en las mujeres no embarazadas y 2 medidas en los hombres. Debe beber alcohol solo mientras come. Hable con su mdico acerca de si el alcohol es seguro para usted. Informe a su mdico si bebe alcohol varias veces a la semana.  Concurra a todas las visitas de control como se lo haya indicado el mdico. Esto es importante.  Programe un examen de la vista poco despus del diagnstico de diabetes tipo 2 y luego anualmente.  Realice diariamente el cuidado de la piel y de los pies. Examine su piel y los pies diariamente para ver si tiene cortes, moretones, enrojecimiento, problemas en las uas, sangrado, ampollas o Advertising account planner. Su mdico debe hacerle un examen de los pies una vez por ao.  Cepllese los dientes y encas por lo menos dos veces al da y use hilo dental al menos una vez por da. Concurra regularmente a las visitas de control con el dentista.  Comparta su plan de control de diabetes en el trabajo o en la escuela.  Mantngase  al da con las vacunas. Se recomienda que se vacune contra la gripe todos los [place]. Adems, que se vacune contra la neumona (vacuna antineumoccica). Si es mayor de 15 aos y nunca se Animator la neumona, esta vacuna puede administrarse como una serie de dos vacunas por separado. Pregntele al mdico qu otras vacunas se pueden recomendar.  Aprenda a Dealer.  Obtenga la mayor cantidad posible de informacin sobre la diabetes y solicite ayuda siempre que sea necesario.  Busque programas de rehabilitacin y participe en ellos para mantener o mejorar su independencia y su calidad de vida. Solicite la derivacin a fisioterapia o  terapia ocupacional si se le [facility] o la mano, o tiene problemas para asearse, vestirse, comer, o durante la [place] fsica. SOLICITE ATENCIN MDICA SI:   No puede comer alimentos o beber por ms de 6 horas.  Tuvo nuseas o ha vomitado durante ms de 6 horas.  Su nivel de glucosa en la sangre es mayor de 240 mg/dl.  Presenta algn cambio en el estado mental.  Desarrolla una enfermedad grave adicional.  Tuvo diarrea durante ms de 6 horas.  Ha estado enfermo o ha tenido fiebre durante un par [address] y no mejora.  Siente dolor al practicar cualquier actividad fsica. SOLICITE ATENCIN MDICA DE INMEDIATO SI:  Tiene dificultad para respirar.  Tiene niveles de cetonas moderados a altos.   Esta informacin no tiene Theme park manager el consejo del mdico. Asegrese de hacerle al mdico cualquier pregunta que tenga.   Document Released: 01/26/2005 Document Revised: 10/17/2014 Elsevier Interactive Patient Education [employer]

## 2015-09-03 ENCOUNTER — Telehealth: Payer: Self-pay

## 2015-09-03 NOTE — Telephone Encounter (Signed)
-----   Message from Angela M McClung, PA-C sent at 07/01/2015 11:40 AM EDT ----- Please call patient and let her know that in addition to her diabetes being uncontrolled, her kidneys are being affected by diabetes and her potassium is low.  We will monitor this closely and it will hopefully improve as we get her sugar under better control.  We may need to adjust/switch medications.  For now, continue medications, and i sent her a prescription supplement of potassium to the pharmacy to take for the next week.   Thanks, Angela McClung,PA-C 

## 2015-09-03 NOTE — Telephone Encounter (Signed)
Clld pt   Used [employer] ID# [id number]  [facility] re lab results.

## 2015-09-04 ENCOUNTER — Telehealth: Payer: Self-pay

## 2015-09-04 NOTE — Telephone Encounter (Signed)
-----   Message from Anders Simmonds, [place] sent at 07/01/2015 11:40 AM EDT ----- Please call patient and let her know that in addition to her diabetes being uncontrolled, her kidneys are being affected by diabetes and her potassium is low.  We will monitor this closely and it will hopefully improve as we get her sugar under better control.  We may need to adjust/switch medications.  For now, continue medications, and i sent her a prescription supplement of potassium to the pharmacy to take for the next week.   Thanks, Anne Ng

## 2015-09-04 NOTE — Telephone Encounter (Signed)
Clld pt   Used [employer] Christiane Ha WH#[id number]  Advsd of lab results; Rx Potassium - Pt stated she understood.

## 2017-07-15 ENCOUNTER — Emergency Department (HOSPITAL_COMMUNITY)
Admission: EM | Admit: 2017-07-15 | Discharge: 2017-07-15 | Disposition: A | Discharge status: Home / Self Care | Payer: Self-pay | Attending: Emergency Medicine | Admitting: Emergency Medicine

## 2017-07-15 ENCOUNTER — Other Ambulatory Visit: Payer: Self-pay

## 2017-07-15 ENCOUNTER — Emergency Department (HOSPITAL_COMMUNITY): Payer: Self-pay

## 2017-07-15 ENCOUNTER — Encounter (HOSPITAL_COMMUNITY): Payer: Self-pay | Admitting: Emergency Medicine

## 2017-07-15 DIAGNOSIS — Z7984 Long term (current) use of oral hypoglycemic drugs: Secondary | ICD-10-CM | POA: Insufficient documentation

## 2017-07-15 DIAGNOSIS — R102 Pelvic and perineal pain: Secondary | ICD-10-CM

## 2017-07-15 DIAGNOSIS — E119 Type 2 diabetes mellitus without complications: Secondary | ICD-10-CM | POA: Insufficient documentation

## 2017-07-15 DIAGNOSIS — Z79899 Other long term (current) drug therapy: Secondary | ICD-10-CM | POA: Insufficient documentation

## 2017-07-15 DIAGNOSIS — B373 Candidiasis of vulva and vagina: Secondary | ICD-10-CM | POA: Insufficient documentation

## 2017-07-15 DIAGNOSIS — N12 Tubulo-interstitial nephritis, not specified as acute or chronic: Secondary | ICD-10-CM

## 2017-07-15 DIAGNOSIS — B3731 Acute candidiasis of vulva and vagina: Secondary | ICD-10-CM

## 2017-07-15 DIAGNOSIS — N10 Acute tubulo-interstitial nephritis: Secondary | ICD-10-CM | POA: Insufficient documentation

## 2017-07-15 LAB — URINALYSIS, ROUTINE W REFLEX MICROSCOPIC
BILIRUBIN URINE: NEGATIVE
Glucose, UA: >=500 mg/dL — AB
KETONES UR: 80 mg/dL — AB
NITRITE: NEGATIVE
Protein, ur: 30 mg/dL — AB
RBC / HPF: >50 RBC/hpf — ABNORMAL HIGH (ref 0–5)
Specific Gravity, Urine: 1.039 — ABNORMAL HIGH (ref 1.005–1.030)
pH: 5 (ref 5.0–8.0)

## 2017-07-15 LAB — CBC
HEMATOCRIT: 41.9 % (ref 36.0–46.0)
HEMOGLOBIN: 15.2 g/dL — AB (ref 12.0–15.0)
MCH: 31.7 pg (ref 26.0–34.0)
MCHC: 36.3 g/dL — ABNORMAL HIGH (ref 30.0–36.0)
MCV: 87.3 fL (ref 78.0–100.0)
Platelets: 275 10*3/uL (ref 150–400)
RBC: 4.8 MIL/uL (ref 3.87–5.11)
RDW: 11.6 % (ref 11.5–15.5)
WBC: 9.9 10*3/uL (ref 4.0–10.5)

## 2017-07-15 LAB — COMPREHENSIVE METABOLIC PANEL
ALT: 20 U/L (ref 14–54)
ANION GAP: 12 (ref 5–15)
AST: 19 U/L (ref 15–41)
Albumin: 3.6 g/dL (ref 3.5–5.0)
Alkaline Phosphatase: 119 U/L (ref 38–126)
BILIRUBIN TOTAL: 1.2 mg/dL (ref 0.3–1.2)
BUN: 6 mg/dL (ref 6–20)
CO2: 17 mmol/L — ABNORMAL LOW (ref 22–32)
Calcium: 7.8 mg/dL — ABNORMAL LOW (ref 8.9–10.3)
Chloride: 103 mmol/L (ref 101–111)
Creatinine, Ser: 0.5 mg/dL (ref 0.44–1.00)
GFR calc Af Amer: >60 mL/min (ref >60)
Glucose, Bld: 309 mg/dL — ABNORMAL HIGH (ref 65–99)
POTASSIUM: 3.2 mmol/L — AB (ref 3.5–5.1)
Sodium: 132 mmol/L — ABNORMAL LOW (ref 135–145)
TOTAL PROTEIN: 6.8 g/dL (ref 6.5–8.1)

## 2017-07-15 LAB — WET PREP, GENITAL
Clue Cells Wet Prep HPF POC: NONE SEEN
Sperm: NONE SEEN
Trich, Wet Prep: NONE SEEN
Yeast Wet Prep HPF POC: NONE SEEN

## 2017-07-15 LAB — LIPASE, BLOOD: Lipase: 19 U/L (ref 11–51)

## 2017-07-15 LAB — I-STAT BETA HCG BLOOD, ED (MC, WL, AP ONLY): I-stat hCG, quantitative: <5 m[IU]/mL (ref <5)

## 2017-07-15 MED ORDER — SODIUM CHLORIDE 0.9 % IV SOLN
1.0000 g | Freq: Once | INTRAVENOUS | Status: AC
  Administered 2017-07-15: 1 g via INTRAVENOUS
  Filled 2017-07-15: qty 10

## 2017-07-15 MED ORDER — CEPHALEXIN 500 MG PO CAPS: 1000.0000 mg | ORAL_CAPSULE | Freq: Two times a day (BID) | ORAL | 0 refills | Status: DC

## 2017-07-15 MED ORDER — AZITHROMYCIN 250 MG PO TABS
1000.0000 mg | ORAL_TABLET | Freq: Once | ORAL | Status: AC
  Administered 2017-07-15: 1000 mg via ORAL
  Filled 2017-07-15: qty 4

## 2017-07-15 MED ORDER — SODIUM CHLORIDE 0.9 % IV BOLUS (SEPSIS)
1000.0000 mL | Freq: Once | INTRAVENOUS | Status: AC
  Administered 2017-07-15: 1000 mL via INTRAVENOUS

## 2017-07-15 MED ORDER — SODIUM CHLORIDE 0.9 % IV SOLN
1000.0000 mL | INTRAVENOUS | Status: DC
  Administered 2017-07-15: 1000 mL via INTRAVENOUS

## 2017-07-15 MED ORDER — FLUCONAZOLE 150 MG PO TABS: 150.0000 mg | ORAL_TABLET | Freq: Once | ORAL | 0 refills | Status: AC

## 2017-07-15 MED ORDER — IBUPROFEN 600 MG PO TABS: 600.0000 mg | ORAL_TABLET | Freq: Four times a day (QID) | ORAL | 0 refills | Status: DC | PRN

## 2017-07-15 MED ORDER — ONDANSETRON HCL 4 MG/2ML IJ SOLN
4.0000 mg | Freq: Once | INTRAMUSCULAR | Status: AC
Start: 2017-07-15 — End: 2017-07-15
  Administered 2017-07-15: 4 mg via INTRAVENOUS
  Filled 2017-07-15: qty 2

## 2017-07-15 MED ORDER — FAMOTIDINE IN NACL 20-0.9 MG/50ML-% IV SOLN
20.0000 mg | Freq: Once | INTRAVENOUS | Status: AC
Start: 2017-07-15 — End: 2017-07-15
  Administered 2017-07-15: 20 mg via INTRAVENOUS
  Filled 2017-07-15: qty 50

## 2017-07-15 NOTE — ED Triage Notes (Signed)
Pt started having abd pain last evening. Pt has n/v/diarrhea.

## 2017-07-15 NOTE — Discharge Instructions (Signed)
1.  You need a recheck with a family doctor.  Use the referral number in your discharge instructions to find one if you do not have one.  You may also schedule an appointment at the [facility]. 2.  If you are getting worse, develop fever, vomiting, worsening pain or blood sugars are getting high, you must return to the emergency department. 3.  Take Keflex twice a day as prescribed.  Take one Diflucan tablet after you fill your prescription.  Take the second 1 after you finish your Keflex.  Take ibuprofen every 8 hours if needed for pain.

## 2017-07-15 NOTE — ED Provider Notes (Signed)
[facility] EMERGENCY DEPARTMENT Provider Note   CSN: [id number] Arrival date & time: 07/15/17  0800     History   Chief Complaint Chief Complaint  Patient presents with  . Abdominal Pain    HPI [facility] Katie Simmons is a 42 y.o. female.  HPI Patient has had lower abdominal pain that is cramping in nature.  She had sudden onset of both vomiting and diarrheal illness yesterday.  She reports multiple episodes of diarrhea and multiple episodes of vomiting.  She also reports that she is found it difficult to urinate.  She is having aching in her lower back.  Patient denies fever.  Denies abnormal vaginal discharge or bleeding.  Patient is sexually active with her spouse, she considers low risk for sexually transmitted disease.  She denies history of similar abdominal pain.  She denies prior abdominal surgeries.  Patient has diabetes without history of significant complications. Past Medical History:  Diagnosis Date  . Diabetes mellitus without complication ([place])     There are no active problems to display for this patient.   History reviewed. No pertinent surgical history.   OB History   None      Home Medications    Prior to Admission medications   Medication Sig Start Date End Date Taking? Authorizing Provider  Blood Glucose Monitoring Suppl (TRUE METRIX METER) w/Device KIT 1 each by Does not apply route 3 (three) times daily. 06/28/15   Argentina Donovan, PA-C  cephALEXin (KEFLEX) 500 MG capsule Take 2 capsules (1,000 mg total) by mouth 2 (two) times daily. 07/15/17   Charlesetta Shanks, MD  fluconazole (DIFLUCAN) 150 MG tablet Take 1 tablet (150 mg total) by mouth once. Patient not taking: Reported on 07/15/2017 06/28/15   Argentina Donovan, PA-C  fluconazole (DIFLUCAN) 150 MG tablet Take 1 tablet (150 mg total) by mouth once for 1 dose. Take 1 tablet.  Finish your antibiotics and then take the second tablet. 07/15/17 07/15/17  Charlesetta Shanks, MD  glipiZIDE  (GLIPIZIDE XL) 5 MG 24 hr tablet Take 1 tablet (5 mg total) by mouth daily with breakfast. Patient not taking: Reported on 07/15/2017 07/11/15   Tresa Garter, MD  glucose blood (TRUE METRIX BLOOD GLUCOSE TEST) test strip Use as instructed 06/28/15   Argentina Donovan, PA-C  ibuprofen (ADVIL,MOTRIN) 600 MG tablet Take 1 tablet (600 mg total) by mouth every 6 (six) hours as needed. 07/15/17   Charlesetta Shanks, MD  metFORMIN (GLUCOPHAGE) 500 MG tablet Take 2 tablets (1,000 mg total) by mouth 2 (two) times daily with a meal. Patient not taking: Reported on 07/15/2017 07/11/15   Tresa Garter, MD  potassium chloride SA (K-DUR,KLOR-CON) 20 MEQ tablet Take 1 tablet (20 mEq total) by mouth daily. X 1 week Patient not taking: Reported on 07/15/2017 07/01/15   Argentina Donovan, PA-C  TRUEPLUS LANCETS 28G MISC 1 each by Does not apply route 3 (three) times daily. 06/28/15   Argentina Donovan, PA-C    Family History History reviewed. No pertinent family history.  Social History Social History   Tobacco Use  . Smoking status: Never Smoker  . Smokeless tobacco: Never Used  Substance Use Topics  . Alcohol use: No    Alcohol/week: 0.0 oz  . Drug use: No     Allergies   Penicillins   Review of Systems Review of Systems 10 Systems reviewed and are negative for acute change except as noted in the HPI.   Physical Exam  Updated Vital Signs BP 110/71   Pulse 95   Temp 98.1 F (36.7 C) (Oral)   Resp 17   Wt 65.8 kg (145 lb)   SpO2 100%   BMI 32.51 kg/m   Physical Exam  Constitutional: She is oriented to person, place, and time. She appears well-developed and well-nourished.  HENT:  Head: Normocephalic and atraumatic.  Nose: Nose normal.  Mouth/Throat: Oropharynx is clear and moist.  Eyes: EOM are normal.  Neck: Neck supple.  Cardiovascular: Normal rate, regular rhythm, normal heart sounds and intact distal pulses.  Pulmonary/Chest: Effort normal and breath sounds normal.  Abdominal:  Soft. Bowel sounds are normal. She exhibits no distension. There is tenderness.  Lower abdomen diffusely tender to palpation.  No guarding.  Positive CVA tenderness bilaterally.  Genitourinary:  Genitourinary Comments: Mild diffuse swelling of vaginal tissues and labia minora.  Curd-like discharge in the vaginal vault.  Cervix with small amount of discharge.no  Blood.  Positive cervical motion tenderness.  Patient endorses tenderness to palpation of the uterus.  Musculoskeletal: Normal range of motion. She exhibits no edema or tenderness.  Neurological: She is alert and oriented to person, place, and time. She has normal strength. She exhibits normal muscle tone. Coordination normal. GCS eye subscore is 4. GCS verbal subscore is 5. GCS motor subscore is 6.  Skin: Skin is warm, dry and intact.  Psychiatric: She has a normal mood and affect.     ED Treatments / Results  Labs (all labs ordered are listed, but only abnormal results are displayed) Labs Reviewed  WET PREP, GENITAL - Abnormal; Notable for the following components:      Result Value   WBC, Wet Prep HPF POC MANY (*)    All other components within normal limits  COMPREHENSIVE METABOLIC PANEL - Abnormal; Notable for the following components:   Sodium 132 (*)    Potassium 3.2 (*)    CO2 17 (*)    Glucose, Bld 309 (*)    Calcium 7.8 (*)    All other components within normal limits  CBC - Abnormal; Notable for the following components:   Hemoglobin 15.2 (*)    MCHC 36.3 (*)    All other components within normal limits  URINALYSIS, ROUTINE W REFLEX MICROSCOPIC - Abnormal; Notable for the following components:   APPearance CLOUDY (*)    Specific Gravity, Urine 1.039 (*)    Glucose, UA >=500 (*)    Hgb urine dipstick SMALL (*)    Ketones, ur 80 (*)    Protein, ur 30 (*)    Leukocytes, UA LARGE (*)    RBC / HPF >50 (*)    WBC, UA >50 (*)    Bacteria, UA MANY (*)    All other components within normal limits  URINE CULTURE    LIPASE, BLOOD  RPR  HIV ANTIBODY (ROUTINE TESTING)  I-STAT BETA HCG BLOOD, ED ([facility], [facility], [facility] ONLY)  CBG MONITORING, ED  GC/CHLAMYDIA PROBE AMP ([place]) NOT AT [facility]    EKG None  Radiology Dg Chest 2 View  Result Date: 07/15/2017 CLINICAL DATA:  Cough and shortness of breath EXAM: CHEST - 2 VIEW COMPARISON:  None. FINDINGS: Lungs are clear. Heart size and pulmonary vascularity are normal. No adenopathy. No bone lesions. IMPRESSION: No edema or consolidation. Electronically Signed   By: Lowella Grip III M.D.   On: 07/15/2017 13:20   Ct Renal Stone Study  Result Date: 07/15/2017 CLINICAL DATA:  42 year old female with history of abdominal  pain since yesterday evening. Nausea, vomiting and diarrhea. EXAM: CT ABDOMEN AND PELVIS WITHOUT CONTRAST TECHNIQUE: Multidetector CT imaging of the abdomen and pelvis was performed following the standard protocol without IV contrast. COMPARISON:  No priors. FINDINGS: Lower chest: Calcified granuloma in the right lower lobe. Hepatobiliary: Heterogeneous areas of low attenuation throughout the hepatic parenchyma, compatible with heterogeneous hepatic steatosis. No definite cystic or solid hepatic lesions are confidently identified on today's noncontrast CT examination. Several partially calcified gallstones are noted within the gallbladder. Gallbladder does not appear distended. No pericholecystic fluid or inflammatory changes. Pancreas: No definite pancreatic mass or peripancreatic fluid or inflammatory changes are identified on today's noncontrast CT examination. Spleen: Unremarkable. Adrenals/Urinary Tract: There are no abnormal calcifications within the collecting system of either kidney, along the course of either ureter, or within the lumen of the urinary bladder. No hydroureteronephrosis or perinephric stranding to suggest urinary tract obstruction at this time. There is some effacement of the normal fatty attenuation in the renal hila bilaterally, which  could indicate inflammation. The unenhanced appearance of the kidneys is otherwise unremarkable bilaterally. Unenhanced appearance of the urinary bladder is normal. Bilateral adrenal glands are normal in appearance. Stomach/Bowel: Normal appearance of the stomach. No pathologic dilatation of small bowel or colon. The appendix is not confidently identified and may be surgically absent. Regardless, there are no inflammatory changes noted adjacent to the cecum to suggest the presence of an acute appendicitis at this time. Vascular/Lymphatic: No atherosclerotic calcifications noted in the abdominal or pelvic vasculature. No lymphadenopathy noted in the abdomen or pelvis. Reproductive: Unenhanced appearance of the uterus and ovaries is unremarkable. Other: No significant volume of ascites.  No pneumoperitoneum. Musculoskeletal: There are no aggressive appearing lytic or blastic lesions noted in the visualized portions of the skeleton. IMPRESSION: 1. Very subtle effacement of the fat in the renal pelvises bilaterally. This is nonspecific, but could indicate inflammation from upper tract urinary infection such as pyelonephritis. Correlation with urinalysis is recommended. 2. Heterogeneous hepatic steatosis. 3. Cholelithiasis without evidence to suggest an acute cholecystitis at this time. 4. Additional incidental findings, as above. Electronically Signed   By: Vinnie Langton M.D.   On: 07/15/2017 14:51    Procedures Procedures (including critical care time)  Medications Ordered in ED Medications  sodium chloride 0.9 % bolus 1,000 mL (0 mLs Intravenous Stopped 07/15/17 1532)    Followed by  sodium chloride 0.9 % bolus 1,000 mL (0 mLs Intravenous Stopped 07/15/17 1532)    Followed by  0.9 %  sodium chloride infusion (1,000 mLs Intravenous New Bag/Given 07/15/17 1609)  ondansetron (ZOFRAN) injection 4 mg (4 mg Intravenous Given 07/15/17 1334)  famotidine (PEPCID) IVPB 20 mg premix (0 mg Intravenous Stopped 07/15/17  1532)  cefTRIAXone (ROCEPHIN) 1 g in sodium chloride 0.9 % 100 mL IVPB (0 g Intravenous Stopped 07/15/17 1639)  azithromycin (ZITHROMAX) tablet 1,000 mg (1,000 mg Oral Given 07/15/17 1747)     Initial Impression / Assessment and Plan / ED Course  I have reviewed the triage vital signs and the nursing notes.  Pertinent labs & imaging results that were available during my care of the patient were reviewed by me and considered in my medical decision making (see chart for details).     Final Clinical Impressions(s) / ED Diagnoses   Final diagnoses:  Pyelonephritis  Pelvic pain in female  Yeast infection involving the vagina and surrounding area  Type 2 diabetes mellitus without complication, without long-term current use of insulin ([place])   Presents  as outlined above with abdominal pain and back pain.  Urinalysis is grossly positive for UTI.  Pelvic examination is positive for uterine tenderness and cervical motion tenderness.  Opted to treat both for cervicitis as well as for pyelonephritis.  She got Rocephin 1 g IV and Zithromax 1 g orally.  We will continue Keflex twice daily.  Exam also shows findings consistent with yeast infection.  Diflucan prescribed.  Patient is nontoxic.  She is afebrile.  No significant leukocytosis.  Patient reports she is able to monitor her blood sugars at home.  She is counseled on return precautions of any development of fever, worsening pain, blood sugars elevating, vomiting or other concerning symptoms.  At time of discharge patient feels much improved, she has been rehydrated, antibiotics administered, there has been no vomiting or diarrhea while in the emergency department. ED Discharge Orders        Ordered    cephALEXin (KEFLEX) 500 MG capsule  2 times daily     07/15/17 1804    fluconazole (DIFLUCAN) 150 MG tablet   Once     07/15/17 1804    ibuprofen (ADVIL,MOTRIN) 600 MG tablet  Every 6 hours PRN     07/15/17 1804       Charlesetta Shanks, MD 07/15/17  1837

## 2017-07-15 NOTE — ED Notes (Signed)
Family at bedside. 

## 2017-07-15 NOTE — ED Notes (Signed)
Walked patient to the bathroom patient did well 

## 2017-07-16 LAB — GC/CHLAMYDIA PROBE AMP (~~LOC~~) NOT AT ARMC
CHLAMYDIA, DNA PROBE: NEGATIVE
Neisseria Gonorrhea: NEGATIVE

## 2017-07-16 LAB — RPR: RPR: NONREACTIVE

## 2017-07-16 LAB — HIV ANTIBODY (ROUTINE TESTING W REFLEX): HIV SCREEN 4TH GENERATION: NONREACTIVE

## 2017-07-17 LAB — URINE CULTURE

## 2017-08-20 ENCOUNTER — Other Ambulatory Visit: Payer: Self-pay | Admitting: Critical Care Medicine

## 2017-08-20 LAB — POCT GLYCOSYLATED HEMOGLOBIN (HGB A1C): Hemoglobin A1C: 14 % — AB (ref 4.0–5.6)

## 2017-08-20 LAB — GLUCOSE, POCT (MANUAL RESULT ENTRY): POC Glucose: 438 mg/dl — AB (ref 70–99)

## 2017-08-20 MED ORDER — CEPHALEXIN 500 MG PO CAPS: 1000.0000 mg | ORAL_CAPSULE | Freq: Two times a day (BID) | ORAL | 0 refills | Status: DC

## 2017-08-20 MED ORDER — GLIPIZIDE ER 5 MG PO TB24: 5.0000 mg | ORAL_TABLET | Freq: Every day | ORAL | 2 refills | Status: DC

## 2017-08-20 MED ORDER — METFORMIN HCL 500 MG PO TABS: 1000.0000 mg | ORAL_TABLET | Freq: Two times a day (BID) | ORAL | 3 refills | Status: DC

## 2017-08-20 NOTE — Progress Notes (Signed)
[facility]  Needs refills on Metformin/glipizide and PCP f/u.  Luisa HartPatrick WrightMD

## 2017-08-20 NOTE — Progress Notes (Unsigned)
Pt with UTI    Plan keflex 1000mg  bid x 7days

## 2017-10-20 ENCOUNTER — Emergency Department (HOSPITAL_COMMUNITY)
Admission: EM | Admit: 2017-10-20 | Discharge: 2017-10-20 | Disposition: A | Discharge status: Home / Self Care | Payer: Self-pay | Attending: Emergency Medicine | Admitting: Emergency Medicine

## 2017-10-20 ENCOUNTER — Other Ambulatory Visit: Payer: Self-pay

## 2017-10-20 ENCOUNTER — Encounter (HOSPITAL_COMMUNITY): Payer: Self-pay

## 2017-10-20 ENCOUNTER — Emergency Department (HOSPITAL_COMMUNITY): Payer: Self-pay

## 2017-10-20 DIAGNOSIS — W2210XA Striking against or struck by unspecified automobile airbag, initial encounter: Secondary | ICD-10-CM | POA: Insufficient documentation

## 2017-10-20 DIAGNOSIS — Y92410 Unspecified street and highway as the place of occurrence of the external cause: Secondary | ICD-10-CM | POA: Insufficient documentation

## 2017-10-20 DIAGNOSIS — E119 Type 2 diabetes mellitus without complications: Secondary | ICD-10-CM | POA: Insufficient documentation

## 2017-10-20 DIAGNOSIS — Y998 Other external cause status: Secondary | ICD-10-CM | POA: Insufficient documentation

## 2017-10-20 DIAGNOSIS — I7779 Dissection of other artery: Secondary | ICD-10-CM

## 2017-10-20 DIAGNOSIS — Y9389 Activity, other specified: Secondary | ICD-10-CM | POA: Insufficient documentation

## 2017-10-20 DIAGNOSIS — Z7984 Long term (current) use of oral hypoglycemic drugs: Secondary | ICD-10-CM | POA: Insufficient documentation

## 2017-10-20 DIAGNOSIS — R109 Unspecified abdominal pain: Secondary | ICD-10-CM | POA: Insufficient documentation

## 2017-10-20 DIAGNOSIS — M549 Dorsalgia, unspecified: Secondary | ICD-10-CM | POA: Insufficient documentation

## 2017-10-20 DIAGNOSIS — R079 Chest pain, unspecified: Secondary | ICD-10-CM | POA: Insufficient documentation

## 2017-10-20 DIAGNOSIS — M79662 Pain in left lower leg: Secondary | ICD-10-CM | POA: Insufficient documentation

## 2017-10-20 LAB — CBC WITH DIFFERENTIAL/PLATELET
Abs Immature Granulocytes: 0 10*3/uL (ref 0.0–0.1)
BASOS ABS: 0.1 10*3/uL (ref 0.0–0.1)
Basophils Relative: 0 %
Eosinophils Absolute: 0 10*3/uL (ref 0.0–0.7)
Eosinophils Relative: 0 %
HCT: 41.7 % (ref 36.0–46.0)
HEMOGLOBIN: 14.7 g/dL (ref 12.0–15.0)
Immature Granulocytes: 0 %
LYMPHS PCT: 24 %
Lymphs Abs: 2.7 10*3/uL (ref 0.7–4.0)
MCH: 32.2 pg (ref 26.0–34.0)
MCHC: 35.3 g/dL (ref 30.0–36.0)
MCV: 91.2 fL (ref 78.0–100.0)
Monocytes Absolute: 0.5 10*3/uL (ref 0.1–1.0)
Monocytes Relative: 4 %
Neutro Abs: 8 10*3/uL — ABNORMAL HIGH (ref 1.7–7.7)
Neutrophils Relative %: 72 %
Platelets: 283 10*3/uL (ref 150–400)
RBC: 4.57 MIL/uL (ref 3.87–5.11)
RDW: 11.9 % (ref 11.5–15.5)
WBC: 11.3 10*3/uL — ABNORMAL HIGH (ref 4.0–10.5)

## 2017-10-20 LAB — COMPREHENSIVE METABOLIC PANEL
ALBUMIN: 3.5 g/dL (ref 3.5–5.0)
ALT: 32 U/L (ref 0–44)
AST: 30 U/L (ref 15–41)
Alkaline Phosphatase: 116 U/L (ref 38–126)
Anion gap: 11 (ref 5–15)
BUN: 6 mg/dL (ref 6–20)
CO2: 20 mmol/L — ABNORMAL LOW (ref 22–32)
Calcium: 8.4 mg/dL — ABNORMAL LOW (ref 8.9–10.3)
Chloride: 102 mmol/L (ref 98–111)
Creatinine, Ser: 0.43 mg/dL — ABNORMAL LOW (ref 0.44–1.00)
GFR calc Af Amer: >60 mL/min (ref >60)
GLUCOSE: 267 mg/dL — AB (ref 70–99)
POTASSIUM: 3.4 mmol/L — AB (ref 3.5–5.1)
Sodium: 133 mmol/L — ABNORMAL LOW (ref 135–145)
TOTAL PROTEIN: 7 g/dL (ref 6.5–8.1)
Total Bilirubin: 1.1 mg/dL (ref 0.3–1.2)

## 2017-10-20 LAB — I-STAT CHEM 8, ED
BUN: 8 mg/dL (ref 6–20)
CALCIUM ION: 1.09 mmol/L — AB (ref 1.15–1.40)
Chloride: 100 mmol/L (ref 98–111)
Creatinine, Ser: 0.2 mg/dL — ABNORMAL LOW (ref 0.44–1.00)
Glucose, Bld: 343 mg/dL — ABNORMAL HIGH (ref 70–99)
HCT: 44 % (ref 36.0–46.0)
Hemoglobin: 15 g/dL (ref 12.0–15.0)
Potassium: 3.8 mmol/L (ref 3.5–5.1)
SODIUM: 133 mmol/L — AB (ref 135–145)
TCO2: 24 mmol/L (ref 22–32)

## 2017-10-20 LAB — I-STAT TROPONIN, ED: TROPONIN I, POC: 0 ng/mL (ref 0.00–0.08)

## 2017-10-20 LAB — I-STAT BETA HCG BLOOD, ED (MC, WL, AP ONLY): I-stat hCG, quantitative: <5 m[IU]/mL (ref <5)

## 2017-10-20 LAB — CBG MONITORING, ED: Glucose-Capillary: 327 mg/dL — ABNORMAL HIGH (ref 70–99)

## 2017-10-20 MED ORDER — IOPAMIDOL (ISOVUE-370) INJECTION 76%: 100.0000 mL | Freq: Once | INTRAVENOUS | Status: DC | PRN

## 2017-10-20 MED ORDER — IOHEXOL 300 MG/ML  SOLN
100.0000 mL | Freq: Once | INTRAMUSCULAR | Status: AC | PRN
  Administered 2017-10-20: 100 mL via INTRAVENOUS

## 2017-10-20 MED ORDER — MORPHINE SULFATE (PF) 4 MG/ML IV SOLN
4.0000 mg | Freq: Once | INTRAVENOUS | Status: AC
  Administered 2017-10-20: 4 mg via INTRAVENOUS
  Filled 2017-10-20: qty 1

## 2017-10-20 MED ORDER — CYCLOBENZAPRINE HCL 5 MG PO TABS: 5.0000 mg | ORAL_TABLET | Freq: Two times a day (BID) | ORAL | 0 refills | Status: DC | PRN
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
## 2017-10-20 NOTE — Discharge Instructions (Signed)
It was my pleasure taking care of you today!   Take 81 mg aspirin daily. Tylenol as needed for pain.  Flexeril is your muscle relaxer to take as needed. Ice affected area will help with muscle soreness as well.  Follow up with vascular surgery (Dr. Arbie Cookey) as listed.  Return to ER for new or worsening symptoms, any additional concerns.
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
## 2017-10-20 NOTE — ED Notes (Signed)
ED Provider at bedside. 

## 2017-10-20 NOTE — ED Notes (Signed)
Pt returned from xray, nad

## 2017-10-20 NOTE — ED Notes (Signed)
Patient transported to X-ray 
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
## 2017-10-20 NOTE — ED Notes (Signed)
Adine Madura (friend) [phone] Pt verbally stated to this RN that is is OK to give medical information to her over the phone
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
## 2017-10-20 NOTE — ED Notes (Signed)
Dr. Early at bedside 

## 2017-10-25 ENCOUNTER — Other Ambulatory Visit: Payer: Self-pay

## 2017-10-25 ENCOUNTER — Telehealth: Payer: Self-pay | Admitting: Vascular Surgery

## 2017-10-25 DIAGNOSIS — T148XXA Other injury of unspecified body region, initial encounter: Secondary | ICD-10-CM

## 2017-10-25 DIAGNOSIS — R079 Chest pain, unspecified: Secondary | ICD-10-CM

## 2017-10-25 NOTE — Telephone Encounter (Signed)
sch appt interpreter called l[facility] ltr CT 11/22/17 1030am  11-30-17 1130am MD

## 2017-11-22 ENCOUNTER — Other Ambulatory Visit: Payer: Self-pay

## 2017-11-22 ENCOUNTER — Ambulatory Visit
Admission: RE | Admit: 2017-11-22 | Discharge: 2017-11-22 | Disposition: A | Discharge status: Home / Self Care | Payer: No Typology Code available for payment source | Source: Ambulatory Visit | Attending: Vascular Surgery | Admitting: Vascular Surgery

## 2017-11-22 DIAGNOSIS — T148XXA Other injury of unspecified body region, initial encounter: Secondary | ICD-10-CM

## 2017-11-22 DIAGNOSIS — R079 Chest pain, unspecified: Secondary | ICD-10-CM

## 2017-11-30 ENCOUNTER — Ambulatory Visit: Payer: Self-pay | Admitting: Vascular Surgery

## 2017-12-01 ENCOUNTER — Encounter: Payer: Self-pay | Admitting: Vascular Surgery

## 2018-01-03 ENCOUNTER — Inpatient Hospital Stay (HOSPITAL_COMMUNITY)
Admission: EM | Admit: 2018-01-03 | Discharge: 2018-01-06 | DRG: 871 | Disposition: A | Discharge status: Home / Self Care | Payer: Self-pay | Attending: Internal Medicine | Admitting: Internal Medicine

## 2018-01-03 ENCOUNTER — Encounter (HOSPITAL_COMMUNITY): Payer: Self-pay | Admitting: Emergency Medicine

## 2018-01-03 ENCOUNTER — Other Ambulatory Visit: Payer: Self-pay

## 2018-01-03 ENCOUNTER — Emergency Department (HOSPITAL_COMMUNITY): Payer: Self-pay

## 2018-01-03 DIAGNOSIS — I959 Hypotension, unspecified: Secondary | ICD-10-CM | POA: Insufficient documentation

## 2018-01-03 DIAGNOSIS — I10 Essential (primary) hypertension: Secondary | ICD-10-CM | POA: Diagnosis present

## 2018-01-03 DIAGNOSIS — Z880 Allergy status to penicillin: Secondary | ICD-10-CM

## 2018-01-03 DIAGNOSIS — R52 Pain, unspecified: Secondary | ICD-10-CM

## 2018-01-03 DIAGNOSIS — Z79899 Other long term (current) drug therapy: Secondary | ICD-10-CM

## 2018-01-03 DIAGNOSIS — E872 Acidosis: Secondary | ICD-10-CM

## 2018-01-03 DIAGNOSIS — E871 Hypo-osmolality and hyponatremia: Secondary | ICD-10-CM | POA: Diagnosis present

## 2018-01-03 DIAGNOSIS — K760 Fatty (change of) liver, not elsewhere classified: Secondary | ICD-10-CM | POA: Diagnosis present

## 2018-01-03 DIAGNOSIS — Z7984 Long term (current) use of oral hypoglycemic drugs: Secondary | ICD-10-CM

## 2018-01-03 DIAGNOSIS — N12 Tubulo-interstitial nephritis, not specified as acute or chronic: Secondary | ICD-10-CM | POA: Diagnosis present

## 2018-01-03 DIAGNOSIS — E876 Hypokalemia: Secondary | ICD-10-CM | POA: Diagnosis present

## 2018-01-03 DIAGNOSIS — R6521 Severe sepsis with septic shock: Secondary | ICD-10-CM | POA: Diagnosis present

## 2018-01-03 DIAGNOSIS — B9620 Unspecified Escherichia coli [E. coli] as the cause of diseases classified elsewhere: Secondary | ICD-10-CM

## 2018-01-03 DIAGNOSIS — R7881 Bacteremia: Secondary | ICD-10-CM

## 2018-01-03 DIAGNOSIS — A419 Sepsis, unspecified organism: Secondary | ICD-10-CM | POA: Diagnosis present

## 2018-01-03 DIAGNOSIS — Z87448 Personal history of other diseases of urinary system: Secondary | ICD-10-CM

## 2018-01-03 DIAGNOSIS — E861 Hypovolemia: Secondary | ICD-10-CM | POA: Diagnosis present

## 2018-01-03 DIAGNOSIS — A4151 Sepsis due to Escherichia coli [E. coli]: Principal | ICD-10-CM | POA: Diagnosis present

## 2018-01-03 DIAGNOSIS — E119 Type 2 diabetes mellitus without complications: Secondary | ICD-10-CM | POA: Diagnosis present

## 2018-01-03 LAB — CBC WITH DIFFERENTIAL/PLATELET
ABS IMMATURE GRANULOCYTES: 0.05 10*3/uL (ref 0.00–0.07)
BASOS ABS: 0 10*3/uL (ref 0.0–0.1)
Basophils Relative: 0 %
EOS ABS: 0 10*3/uL (ref 0.0–0.5)
Eosinophils Relative: 0 %
HEMATOCRIT: 46.1 % — AB (ref 36.0–46.0)
HEMOGLOBIN: 15.5 g/dL — AB (ref 12.0–15.0)
IMMATURE GRANULOCYTES: 0 %
LYMPHS ABS: 1.7 10*3/uL (ref 0.7–4.0)
LYMPHS PCT: 14 %
MCH: 30.6 pg (ref 26.0–34.0)
MCHC: 33.6 g/dL (ref 30.0–36.0)
MCV: 90.9 fL (ref 80.0–100.0)
MONOS PCT: 1 %
Monocytes Absolute: 0.1 10*3/uL (ref 0.1–1.0)
NEUTROS ABS: 10.2 10*3/uL — AB (ref 1.7–7.7)
NEUTROS PCT: 85 %
NRBC: 0 % (ref 0.0–0.2)
Platelets: 308 10*3/uL (ref 150–400)
RBC: 5.07 MIL/uL (ref 3.87–5.11)
RDW: 11.8 % (ref 11.5–15.5)
WBC: 12.2 10*3/uL — ABNORMAL HIGH (ref 4.0–10.5)

## 2018-01-03 LAB — COMPREHENSIVE METABOLIC PANEL
ALBUMIN: 3.4 g/dL — AB (ref 3.5–5.0)
ALT: 16 U/L (ref 0–44)
ANION GAP: 14 (ref 5–15)
AST: 21 U/L (ref 15–41)
Alkaline Phosphatase: 126 U/L (ref 38–126)
BUN: 10 mg/dL (ref 6–20)
CO2: 18 mmol/L — AB (ref 22–32)
Calcium: 8.6 mg/dL — ABNORMAL LOW (ref 8.9–10.3)
Chloride: 100 mmol/L (ref 98–111)
Creatinine, Ser: 0.51 mg/dL (ref 0.44–1.00)
GFR calc Af Amer: >60 mL/min (ref >60)
GFR calc non Af Amer: >60 mL/min (ref >60)
GLUCOSE: 284 mg/dL — AB (ref 70–99)
POTASSIUM: 3.7 mmol/L (ref 3.5–5.1)
SODIUM: 132 mmol/L — AB (ref 135–145)
Total Bilirubin: 1.2 mg/dL (ref 0.3–1.2)
Total Protein: 8 g/dL (ref 6.5–8.1)

## 2018-01-03 LAB — URINALYSIS, ROUTINE W REFLEX MICROSCOPIC
Bilirubin Urine: NEGATIVE
Ketones, ur: 80 mg/dL — AB
Nitrite: NEGATIVE
PROTEIN: NEGATIVE mg/dL
SPECIFIC GRAVITY, URINE: 1.006 (ref 1.005–1.030)
pH: 5 (ref 5.0–8.0)

## 2018-01-03 LAB — I-STAT BETA HCG BLOOD, ED (MC, WL, AP ONLY): I-stat hCG, quantitative: <5 m[IU]/mL (ref <5)

## 2018-01-03 LAB — CBG MONITORING, ED
GLUCOSE-CAPILLARY: 178 mg/dL — AB (ref 70–99)
Glucose-Capillary: 186 mg/dL — ABNORMAL HIGH (ref 70–99)

## 2018-01-03 LAB — I-STAT CG4 LACTIC ACID, ED
Lactic Acid, Venous: 1.83 mmol/L (ref 0.5–1.9)
Lactic Acid, Venous: 0.74 mmol/L (ref 0.5–1.9)

## 2018-01-03 LAB — LIPASE, BLOOD: Lipase: 20 U/L (ref 11–51)

## 2018-01-03 LAB — HEMOGLOBIN A1C
Hgb A1c MFr Bld: 12.7 % — ABNORMAL HIGH (ref 4.8–5.6)
Mean Plasma Glucose: 317.79 mg/dL

## 2018-01-03 LAB — GLUCOSE, CAPILLARY: GLUCOSE-CAPILLARY: 194 mg/dL — AB (ref 70–99)

## 2018-01-03 MED ORDER — INSULIN ASPART 100 UNIT/ML ~~LOC~~ SOLN
0.0000 [IU] | Freq: Three times a day (TID) | SUBCUTANEOUS | Status: DC
  Administered 2018-01-03: 1 [IU] via SUBCUTANEOUS
  Administered 2018-01-04: 5 [IU] via SUBCUTANEOUS
  Administered 2018-01-04: 3 [IU] via SUBCUTANEOUS
  Administered 2018-01-04: 5 [IU] via SUBCUTANEOUS

## 2018-01-03 MED ORDER — IOHEXOL 300 MG/ML  SOLN
100.0000 mL | Freq: Once | INTRAMUSCULAR | Status: AC | PRN
  Administered 2018-01-03: 100 mL via INTRAVENOUS

## 2018-01-03 MED ORDER — ACETAMINOPHEN 325 MG PO TABS
650.0000 mg | ORAL_TABLET | Freq: Once | ORAL | Status: AC
  Administered 2018-01-03: 650 mg via ORAL
  Filled 2018-01-03: qty 2

## 2018-01-03 MED ORDER — FENTANYL CITRATE (PF) 100 MCG/2ML IJ SOLN
50.0000 ug | Freq: Once | INTRAMUSCULAR | Status: AC
  Administered 2018-01-03: 50 ug via INTRAVENOUS
  Filled 2018-01-03: qty 2

## 2018-01-03 MED ORDER — SODIUM CHLORIDE 0.9 % IV SOLN
1.0000 g | INTRAVENOUS | Status: DC
  Filled 2018-01-03: qty 10

## 2018-01-03 MED ORDER — SODIUM CHLORIDE 0.9% FLUSH
3.0000 mL | Freq: Two times a day (BID) | INTRAVENOUS | Status: DC
  Administered 2018-01-03 – 2018-01-05 (×4): 3 mL via INTRAVENOUS

## 2018-01-03 MED ORDER — SODIUM CHLORIDE 0.9 % IV SOLN
INTRAVENOUS | Status: DC
  Administered 2018-01-03 – 2018-01-04 (×2): via INTRAVENOUS

## 2018-01-03 MED ORDER — ACETAMINOPHEN 325 MG PO TABS
650.0000 mg | ORAL_TABLET | Freq: Four times a day (QID) | ORAL | Status: DC | PRN
  Administered 2018-01-04 (×2): 650 mg via ORAL
  Filled 2018-01-03 (×2): qty 2

## 2018-01-03 MED ORDER — MORPHINE SULFATE (PF) 4 MG/ML IV SOLN: 4.0000 mg | Freq: Once | INTRAVENOUS | Status: DC

## 2018-01-03 MED ORDER — SODIUM CHLORIDE 0.9 % IV SOLN
1.0000 g | Freq: Once | INTRAVENOUS | Status: AC
  Administered 2018-01-03: 1 g via INTRAVENOUS
  Filled 2018-01-03: qty 10

## 2018-01-03 MED ORDER — ONDANSETRON HCL 4 MG/2ML IJ SOLN
4.0000 mg | Freq: Three times a day (TID) | INTRAMUSCULAR | Status: DC | PRN
  Administered 2018-01-03 – 2018-01-06 (×2): 4 mg via INTRAVENOUS
  Filled 2018-01-03 (×2): qty 2

## 2018-01-03 MED ORDER — ACETAMINOPHEN 650 MG RE SUPP: 650.0000 mg | Freq: Four times a day (QID) | RECTAL | Status: DC | PRN

## 2018-01-03 MED ORDER — HYDROMORPHONE HCL 1 MG/ML IJ SOLN
0.5000 mg | INTRAMUSCULAR | Status: DC | PRN
  Administered 2018-01-03: 0.5 mg via INTRAVENOUS
  Filled 2018-01-03: qty 1

## 2018-01-03 MED ORDER — SODIUM CHLORIDE 0.9 % IV BOLUS
1000.0000 mL | Freq: Once | INTRAVENOUS | Status: AC
  Administered 2018-01-03: 1000 mL via INTRAVENOUS

## 2018-01-03 MED ORDER — SODIUM CHLORIDE 0.9 % IV BOLUS
500.0000 mL | Freq: Once | INTRAVENOUS | Status: AC
  Administered 2018-01-03: 500 mL via INTRAVENOUS

## 2018-01-03 MED ORDER — SODIUM CHLORIDE 0.9 % IV BOLUS
2000.0000 mL | Freq: Once | INTRAVENOUS | Status: AC
  Administered 2018-01-03: 2000 mL via INTRAVENOUS

## 2018-01-03 MED ORDER — ENOXAPARIN SODIUM 40 MG/0.4ML ~~LOC~~ SOLN
40.0000 mg | SUBCUTANEOUS | Status: DC
  Administered 2018-01-03 – 2018-01-05 (×3): 40 mg via SUBCUTANEOUS
  Filled 2018-01-03 (×3): qty 0.4

## 2018-01-03 NOTE — ED Provider Notes (Signed)
[facility] EMERGENCY DEPARTMENT Provider Note   CSN: [id number] Arrival date & time: 01/03/18  1035     History   Chief Complaint Chief Complaint  Patient presents with  . Abdominal Pain  . Back Pain   Spanish translator utilized  HPI Katie Simmons is a 42 y.o. female.  HPI 42 year old female without significant past medical history presents the emergency department 3 days of worsening dysuria nausea vomiting as well as right-sided abdominal and flank pain.  No prior history of kidney stones.  Her pain is severe in severity at this time.  She presents the emergency department febrile to 103, hypotensive, tachycardic.  No diarrhea.  No other complaints.   Past Medical History:  Diagnosis Date  . Diabetes mellitus without complication ([place])     There are no active problems to display for this patient.   History reviewed. No pertinent surgical history.   OB History   None      Home Medications    Prior to Admission medications   Medication Sig Start Date End Date Taking? Authorizing Provider  cyclobenzaprine (FLEXERIL) 5 MG tablet Take 1 tablet (5 mg total) by mouth 2 (two) times daily as needed for muscle spasms. 10/20/17  Yes Ward, Ozella Almond, PA-C  metFORMIN (GLUCOPHAGE) 500 MG tablet Take 2 tablets (1,000 mg total) by mouth 2 (two) times daily with a meal. 08/20/17  Yes Elsie Stain, MD  Blood Glucose Monitoring Suppl (TRUE METRIX METER) w/Device KIT 1 each by Does not apply route 3 (three) times daily. 06/28/15   Argentina Donovan, PA-C  cephALEXin (KEFLEX) 500 MG capsule Take 2 capsules (1,000 mg total) by mouth 2 (two) times daily. Patient not taking: Reported on 01/03/2018 08/20/17   Elsie Stain, MD  fluconazole (DIFLUCAN) 150 MG tablet Take 1 tablet (150 mg total) by mouth once. Patient not taking: Reported on 07/15/2017 06/28/15   Argentina Donovan, PA-C  glipiZIDE (GLIPIZIDE XL) 5 MG 24 hr tablet Take 1 tablet (5 mg  total) by mouth daily with breakfast. Patient not taking: Reported on 01/03/2018 08/20/17   Elsie Stain, MD  glucose blood (TRUE METRIX BLOOD GLUCOSE TEST) test strip Use as instructed 06/28/15   Argentina Donovan, PA-C  ibuprofen (ADVIL,MOTRIN) 600 MG tablet Take 1 tablet (600 mg total) by mouth every 6 (six) hours as needed. Patient not taking: Reported on 01/03/2018 07/15/17   Charlesetta Shanks, MD  potassium chloride SA (K-DUR,KLOR-CON) 20 MEQ tablet Take 1 tablet (20 mEq total) by mouth daily. X 1 week Patient not taking: Reported on 07/15/2017 07/01/15   Argentina Donovan, PA-C  TRUEPLUS LANCETS 28G MISC 1 each by Does not apply route 3 (three) times daily. 06/28/15   Argentina Donovan, PA-C    Family History History reviewed. No pertinent family history.  Social History Social History   Tobacco Use  . Smoking status: Never Smoker  . Smokeless tobacco: Never Used  Substance Use Topics  . Alcohol use: No    Alcohol/week: 0.0 standard drinks  . Drug use: No     Allergies   Penicillins   Review of Systems Review of Systems  All other systems reviewed and are negative.    Physical Exam Updated Vital Signs BP 90/63   Pulse (!) 102   Temp 97.7 F (36.5 C) (Oral)   Resp (!) 27   LMP 12/20/2017 (LMP Unknown)   SpO2 97%   Physical Exam  Constitutional: She is  oriented to person, place, and time. She appears well-developed and well-nourished. No distress.  HENT:  Head: Normocephalic and atraumatic.  Eyes: EOM are normal.  Neck: Normal range of motion.  Cardiovascular: Normal rate, regular rhythm and normal heart sounds.  Pulmonary/Chest: Effort normal and breath sounds normal.  Abdominal: Soft. She exhibits no distension.  Right-sided abdominal tenderness.  Musculoskeletal: Normal range of motion.  Neurological: She is alert and oriented to person, place, and time.  Skin: Skin is warm and dry.  Psychiatric: She has a normal mood and affect. Judgment normal.    Nursing note and vitals reviewed.    ED Treatments / Results  Labs (all labs ordered are listed, but only abnormal results are displayed) Labs Reviewed  COMPREHENSIVE METABOLIC PANEL - Abnormal; Notable for the following components:      Result Value   Sodium 132 (*)    CO2 18 (*)    Glucose, Bld 284 (*)    Calcium 8.6 (*)    Albumin 3.4 (*)    All other components within normal limits  CBC WITH DIFFERENTIAL/PLATELET - Abnormal; Notable for the following components:   WBC 12.2 (*)    Hemoglobin 15.5 (*)    HCT 46.1 (*)    Neutro Abs 10.2 (*)    All other components within normal limits  URINALYSIS, ROUTINE W REFLEX MICROSCOPIC - Abnormal; Notable for the following components:   Color, Urine STRAW (*)    APPearance HAZY (*)    Glucose, UA >=500 (*)    Hgb urine dipstick MODERATE (*)    Ketones, ur 80 (*)    Leukocytes, UA MODERATE (*)    Bacteria, UA RARE (*)    All other components within normal limits  CULTURE, BLOOD (ROUTINE X 2)  CULTURE, BLOOD (ROUTINE X 2)  URINE CULTURE  LIPASE, BLOOD  LIPASE, BLOOD  I-STAT CG4 LACTIC ACID, ED  I-STAT BETA HCG BLOOD, ED ([facility], [facility], [facility] ONLY)  I-STAT CG4 LACTIC ACID, ED    EKG None  Radiology Ct Abdomen Pelvis W Contrast  Result Date: 01/03/2018 CLINICAL DATA:  42 year old female with complaints of flank, abdominal and back pain for the past 3 days. Dysuria. EXAM: CT ABDOMEN AND PELVIS WITH CONTRAST TECHNIQUE: Multidetector CT imaging of the abdomen and pelvis was performed using the standard protocol following bolus administration of intravenous contrast. CONTRAST:  174m OMNIPAQUE IOHEXOL 300 MG/ML  SOLN COMPARISON:  10/20/2017 CT FINDINGS: Lower chest: Bibasilar atelectasis. Calcified nodule at the right lung base consistent with a small granuloma. Normal size heart without pericardial effusion or thickening. Small hiatal hernia. Hepatobiliary: Steatosis of the liver. Minimal biliary sludge and calculi within the gallbladder. No  secondary signs of acute cholecystitis. No space-occupying enhancing mass of the liver. Pancreas: Normal Spleen: Normal Adrenals/Urinary Tract: Striated enhancement pattern both kidneys, left greater than right compatible with changes of pyelonephritis. No conclusive evidence of lobar nephronia though there is slightly more confluent hypoattenuation of the left lower pole renal cortex. No obstructive uropathy. The urinary bladder is thick-walled in appearance although partially decompressed. Given the abnormal appearance of the kidneys, strongly suspect concomitant cystitis. Normal bilateral adrenal glands. Stomach/Bowel: The stomach is decompressed in appearance. The duodenal sweep and ligament of Treitz are normal. No bowel obstruction or acute inflammation. Normal-appearing appendix. Average amount of fecal retention within the colon without obstruction. Vascular/Lymphatic: No significant vascular findings are present. No enlarged abdominal or pelvic lymph nodes. Reproductive: Fibroid uterus with hypodense intramural fundal anterior leiomyoma noted. No adnexal mass.  Other: No free air nor free fluid. Musculoskeletal: No acute or significant osseous findings. IMPRESSION: 1. Striated enhancement pattern of both kidneys, left greater than right compatible with changes of pyelonephritis. More confluent hypoattenuation in the lower pole of the left kidney may represent early lobar nephronia but this is inconclusive at this time. 2. Thick-walled appearance of the urinary bladder which likely reflects concomitant cystitis. 3. Fibroid uterus. 4. Hepatic steatosis.  Uncomplicated cholelithiasis. 5. Calcified granuloma at the right lung base. Electronically Signed   By: Ashley Royalty M.D.   On: 01/03/2018 14:20    Procedures .Critical Care Performed by: Jola Schmidt, MD Authorized by: Jola Schmidt, MD     CRITICAL CARE Performed by: Jola Schmidt Total critical care time: 35 minutes Critical care time was  exclusive of separately billable procedures and treating other patients. Critical care was necessary to treat or prevent imminent or life-threatening deterioration. Critical care was time spent personally by me on the following activities: development of treatment plan with patient and/or surrogate as well as nursing, discussions with consultants, evaluation of patient's response to treatment, examination of patient, obtaining history from patient or surrogate, ordering and performing treatments and interventions, ordering and review of laboratory studies, ordering and review of radiographic studies, pulse oximetry and re-evaluation of patient's condition.   Medications Ordered in ED Medications  acetaminophen (TYLENOL) tablet 650 mg (650 mg Oral Given 01/03/18 1054)  sodium chloride 0.9 % bolus 1,000 mL (0 mLs Intravenous Stopped 01/03/18 1200)  cefTRIAXone (ROCEPHIN) 1 g in sodium chloride 0.9 % 100 mL IVPB (0 g Intravenous Stopped 01/03/18 1312)  sodium chloride 0.9 % bolus 2,000 mL (0 mLs Intravenous Stopped 01/03/18 1504)  fentaNYL (SUBLIMAZE) injection 50 mcg (50 mcg Intravenous Given 01/03/18 1315)  iohexol (OMNIPAQUE) 300 MG/ML solution 100 mL (100 mLs Intravenous Contrast Given 01/03/18 1400)     Initial Impression / Assessment and Plan / ED Course  I have reviewed the triage vital signs and the nursing notes.  Pertinent labs & imaging results that were available during my care of the patient were reviewed by me and considered in my medical decision making (see chart for details).     Patient with evidence of severe pyelonephritis.  Urine culture sent.  Blood cultures obtained.  Lactate is improving with IV hydration.  4 L of normal saline were given here in the emergency department.  Remains hypotensive with blood pressures into the mid 80s.  Map is around 62.  We will continue to follow closely while in the emergency department.  She will be admitted to the hospital because of her  persistent hypotension.  She overall looks improved however her blood pressure is still concerning.  No indication for pressors at this time.  Mentating normally.    Final Clinical Impressions(s) / ED Diagnoses   Final diagnoses:  Pyelonephritis  Hypotension, unspecified hypotension type    ED Discharge Orders    None       Jola Schmidt, MD 01/03/18 [id number]

## 2018-01-03 NOTE — Consult Note (Addendum)
NAME:  Katie Simmons, MRN:  [id number], DOB:  08/31/1975, LOS: 0 ADMISSION DATE:  01/03/2018, CONSULTATION DATE:  01/03/18 REFERRING MD:  Beryle Beams  CHIEF COMPLAINT:  Dysuria   Brief History   Katie Simmons is a 42 y.o. female who was admitted with pyelonephritis.  PCCM called for borderline hypotension.  History of present illness   Pt is spanish speaking; therefore, the hospital computer based translator was used to obtain this HPI. Katie Simmons is a 42 y.o. female who has a PMH of DM.  She presented to [facility] ED 11/25 with dysuria, b/l flank pain, N/V, abd pain.  Symptoms had started 3 days prior and worsened.  She also complains of subjective fever that "was very high".  Denies any chills, myalgias.    In ED, she was found to have fever to 103 and was hypotensive and tachycardic.  CT of the abdomen and pelvis demonstrated findings compatible with pyelonephritis and probable cystitis. She was given 4L NS bolus and although BP did improve, it was still on the soft side; therefore, PCCM was asked to see in consultation.  During the time of my evaluation, pt was resting comfortably on room air.  She has clear lung fields, dry mucous membranes, and skin tenting on exam.  Currently her MAP is 79.  Past Medical History  DM.  Significant Hospital Events   11/25 > admit, PCCM consult.  Consults:  PCCM.  Procedures:  None.  Significant Diagnostic Tests:  CT A / P 11/25 > findings compatible with pyelonephritis, probable cystitis, fibroid uterus, hepatic steatosis, uncomplicated cholelithiasis, calcified granuloma at the right lung base.  Micro Data:  Urine 11/25 >  Blood 11/25 >   Antimicrobials:  Ceftriaxone 11/25 >   Interim history/subjective:  Comfortable, flank pain controlled.  Objective:  Blood pressure 97/67, pulse 92, temperature 97.7 F (36.5 C), temperature source Oral, resp. rate (!) 22, last menstrual period 12/20/2017, SpO2 99  %.       No intake or output data in the 24 hours ending 01/03/18 1743 There were no vitals filed for this visit.  Examination: General: Adult Hispanic female, in NAD. Neuro: A&O x 3, no deficits. HEENT: [place]/AT. Sclerae anicteric.  MM dry. Cardiovascular: RRR, no M/R/G.  Lungs: Respirations even and unlabored.  CTA bilaterally, No W/R/R.  Abdomen: BS x 4, soft, NT/ND. + CVA tenderness. Musculoskeletal: No gross deformities, no edema.  Skin: Intact, warm, no rashes.  Assessment & Plan:   Shock - combo hypovolemia and septic from pyelonephritis and probable cystitis.  Lactate reassuring. - Continue aggressive IVF resuscitation, additional 500cc bolus now. - No role for pressors at this time. - Continue ceftriaxone and follow cultures.  Hyponatremia - hypovolemic. - Continue fluids. - Follow BMP.  Pseudohypocalcemia - Corrects to 9.6. - Assess ionized calcium in AM.  Hx DM. - Continue SSI. - Hold preadmission metformin, glipizide.   BP is gradually responding to IVF's.  She appears dry despite 4L NS bolus.  Would continue IVF's (have ordered an additional 500cc bolus for now, could likely tolerate more).  Continue abx.  No role vasopressors. Nothing further to add.  PCCM will sign off.  Please do not hesitate to call us back if we can be of any further assistance.   Best Practice:  Diet: Carb Mod. Pain/Anxiety/Delirium protocol (if indicated): N/A. VAP protocol (if indicated): N/A. DVT prophylaxis: SCD's / Lovenox. GI prophylaxis: N/A. Glucose control: SSI. Mobility: Up at lib. Code Status: Full.  Family Communication: None available. Disposition: SDU.  Labs   CBC: Recent Labs  Lab 01/03/18 1050  WBC 12.2*  NEUTROABS 10.2*  HGB 15.5*  HCT 46.1*  MCV 90.9  PLT 932   Basic Metabolic Panel: Recent Labs  Lab 01/03/18 1050  NA 132*  K 3.7  CL 100  CO2 18*  GLUCOSE 284*  BUN 10  CREATININE 0.51  CALCIUM 8.6*   GFR: CrCl cannot be calculated (Unknown  ideal weight.). Recent Labs  Lab 01/03/18 1050 01/03/18 1101 01/03/18 1253  WBC 12.2*  --   --   LATICACIDVEN  --  1.83 0.74   Liver Function Tests: Recent Labs  Lab 01/03/18 1050  AST 21  ALT 16  ALKPHOS 126  BILITOT 1.2  PROT 8.0  ALBUMIN 3.4*   Recent Labs  Lab 01/03/18 1050  LIPASE 20   No results for input(s): AMMONIA in the last 168 hours. ABG    Component Value Date/Time   TCO2 24 10/20/2017 1427    Coagulation Profile: No results for input(s): INR, PROTIME in the last 168 hours. Cardiac Enzymes: No results for input(s): CKTOTAL, CKMB, CKMBINDEX, TROPONINI in the last 168 hours. HbA1C: Hemoglobin A1C  Date/Time Value Ref Range Status  08/20/2017 08:44 PM 14.0 (A) 4.0 - 5.6 % Final  06/28/2015 10:51 AM 14%  Final    Comment:    ID DRIVE, FAITH ACTION INT   Hgb A1c MFr Bld  Date/Time Value Ref Range Status  06/28/2015 02:22 PM 14.1 (H) <5.7 % Final    Comment:      For someone without known diabetes, a hemoglobin A1c value of 6.5% or greater indicates that they may have diabetes and this should be confirmed with a follow-up test.   For someone with known diabetes, a value <7% indicates that their diabetes is well controlled and a value greater than or equal to 7% indicates suboptimal control. A1c targets should be individualized based on duration of diabetes, age, comorbid conditions, and other considerations.   Currently, no consensus exists for use of hemoglobin A1c for diagnosis of diabetes for children.      CBG: Recent Labs  Lab 01/03/18 1712  GLUCAP 186*    Review of Systems:   All negative; except for those that are bolded, which indicate positives.  Constitutional: weight loss, weight gain, night sweats, subjective fevers, chills, fatigue, weakness.  HEENT: headaches, sore throat, sneezing, nasal congestion, post nasal drip, difficulty swallowing, tooth/dental problems, visual complaints, visual changes, ear aches. Neuro:  difficulty with speech, weakness, numbness, ataxia. CV:  chest pain, orthopnea, PND, swelling in lower extremities, dizziness, palpitations, syncope.  Resp: cough, hemoptysis, dyspnea, wheezing. GI: heartburn, indigestion, abdominal pain, nausea, vomiting, diarrhea, constipation, change in bowel habits, loss of appetite, hematemesis, melena, hematochezia.  GU: dysuria, change in color of urine, urgency or frequency, flank pain, hematuria. MSK: joint pain or swelling, decreased range of motion. Psych: change in mood or affect, depression, anxiety, suicidal ideations, homicidal ideations. Skin: rash, itching, bruising.   Past medical history  She,  has a past medical history of Diabetes mellitus without complication ([place]).   Surgical History   History reviewed. No pertinent surgical history.   Social History   reports that she has never smoked. She has never used smokeless tobacco. She reports that she does not drink alcohol or use drugs.   Family history   Her family history is not on file.   Allergies Allergies  Allergen Reactions  . Penicillins Itching  Has patient had a PCN reaction causing immediate rash, facial/tongue/throat swelling, SOB or lightheadedness with hypotension: No Has patient had a PCN reaction causing severe rash involving mucus membranes or skin necrosis: No Has patient had a PCN reaction that required hospitalization: No Has patient had a PCN reaction occurring within the last 10 years: Yes If all of the above answers are "NO", then may proceed with Cephalosporin use.     Home meds  Prior to Admission medications   Medication Sig Start Date End Date Taking? Authorizing Provider  cyclobenzaprine (FLEXERIL) 5 MG tablet Take 1 tablet (5 mg total) by mouth 2 (two) times daily as needed for muscle spasms. 10/20/17  Yes Ward, Ozella Almond, PA-C  metFORMIN (GLUCOPHAGE) 500 MG tablet Take 2 tablets (1,000 mg total) by mouth 2 (two) times daily with a meal. 08/20/17   Yes Elsie Stain, MD  Blood Glucose Monitoring Suppl (TRUE METRIX METER) w/Device KIT 1 each by Does not apply route 3 (three) times daily. 06/28/15   Argentina Donovan, PA-C  cephALEXin (KEFLEX) 500 MG capsule Take 2 capsules (1,000 mg total) by mouth 2 (two) times daily. Patient not taking: Reported on 01/03/2018 08/20/17   Elsie Stain, MD  fluconazole (DIFLUCAN) 150 MG tablet Take 1 tablet (150 mg total) by mouth once. Patient not taking: Reported on 07/15/2017 06/28/15   Argentina Donovan, PA-C  glipiZIDE (GLIPIZIDE XL) 5 MG 24 hr tablet Take 1 tablet (5 mg total) by mouth daily with breakfast. Patient not taking: Reported on 01/03/2018 08/20/17   Elsie Stain, MD  glucose blood (TRUE METRIX BLOOD GLUCOSE TEST) test strip Use as instructed 06/28/15   Argentina Donovan, PA-C  ibuprofen (ADVIL,MOTRIN) 600 MG tablet Take 1 tablet (600 mg total) by mouth every 6 (six) hours as needed. Patient not taking: Reported on 01/03/2018 07/15/17   Charlesetta Shanks, MD  potassium chloride SA (K-DUR,KLOR-CON) 20 MEQ tablet Take 1 tablet (20 mEq total) by mouth daily. X 1 week Patient not taking: Reported on 07/15/2017 07/01/15   Argentina Donovan, PA-C  TRUEPLUS LANCETS 28G MISC 1 each by Does not apply route 3 (three) times daily. 06/28/15   Argentina Donovan, PA-C     Montey Hora, [facility] Pager: [phone]  or [phone] 01/03/2018, 5:43 PM  Attending Note:  42 year old female with no significant PMH who presents to the ED with abdominal pain.  On exam, she has severe pain to palpation with no evidence of peritoneal signs.  I reviewed abdominal CT myself, stranding noted, consistent with pyelonephritis.  Discussed with PCCM-NP.  Septic shock: clearing lactic acid, mentation is well and making urine, no evidence of end-organ perfusion.  - IVF resuscitation  - No pressors for now  - Admit to SDU  - Treat infection as below  [place]o:  -  Rocephin   - Pan culture  - F/u on culture  Pain:  - Narcs as ordered  Ok to admit to SDU, no need for ICU care at this point, PCCM will be available PRN  Patient seen and examined, agree with above note.  I dictated the care and orders written for this patient under my direction.  Rush Farmer, [place]

## 2018-01-03 NOTE — Progress Notes (Signed)
Medicine attending admission note: I personally interviewed this lady with the assistance of a native language speaker (Dr. Lovenia KimSantos-Sanchez) and I attest to the accuracy of the evaluation and management plan as recorded in the history and physical by resident physician Dr Guinevere ScarletJaimie Seawell.  42 year old Hispanic woman who presents with a 3-day history of nausea, intermittent vomiting, bilateral flank pain.  She admits to dysuria and frequency.  No hematuria.  She has type 2 diabetes on oral agent only. Initial vital signs with blood pressure 147/88, pulse 123, respirations 23, temperature 103.3 axillary, 102.8 oral. White blood count 12,200 with 85% neutrophils.  Hemoglobin 15.  Platelets 308,000.  Lactic acid 1.83, repeat 0.74. BUN 10, creatinine 0.5.  Random glucose 284. Urine: 21-50 white cells, 0-5 red cells, positive glucose, negative nitrite, negative protein. CT abdomen pelvis with contrast: No obstructing renal stones.  Inflammatory changes around both kidneys left greater than right consistent with pyelonephritis.  Thick-walled appearance of the bladder likely reflecting concomitant cystitis.  Incidental hepatic steatosis and nonobstructing cholelithiasis.  Calcified granuloma right lung base.  While in the emergency department, blood pressure fell as low as 82/56.  She has now received 4 L of normal saline.  Blood pressure 97/67.  Pulse 92.  Current exam: Blood pressure 97/67, pulse 92, temperature 97.7 F (36.5 C), temperature source Oral, resp. rate (!) 22, last menstrual period 12/20/2017, SpO2 99 %. Pleasant woman who is in pain on attempting to sit up.  Pertinent findings limited to the flanks which are exquisitely tender to palpation.  Abdomen is otherwise soft.  Mildly tender in the right upper quadrant which she says is chronic. Urine in the commode appears clear.  No blood.  Impression: 1. Bilateral pyelonephritis.  Urosepsis. We will continue aggressive hydration.  Obtain blood  and urine cultures.  Antibiotics initiated. We did ask critical care medicine to evaluate the patient in the event that she should deteriorate and require pressor support in the intensive care unit setting. 2.  Type 2 diabetes on oral agent Monitor sugars.  Sliding scale short acting insulin.

## 2018-01-03 NOTE — ED Notes (Signed)
Used translator to give pt option between [employer]purewick and bedpan, purewick placed. Pt aware of need for urine sample.

## 2018-01-03 NOTE — ED Notes (Signed)
Pt aware of need for second urine collection due to label error per lab. Pt not able to use bedpan at this time

## 2018-01-03 NOTE — ED Triage Notes (Signed)
Pt arrives to ED from home with complaints of abdominal pain, flank pain, back pain for the past three days. Pt reports she has had pain on urination as well. Pt placed in position of comfort with bed locked and lowered, call bell in reach.

## 2018-01-03 NOTE — ED Notes (Signed)
RN hung another Liter of fluid

## 2018-01-03 NOTE — H&P (Signed)
Date: 01/03/2018               Patient Name:  Katie Simmons MRN: [id number]  DOB: 07/20/1975 Age / Sex: 42 y.o., female   PCP: Patient, No Pcp Per         Medical Service: Internal Medicine Teaching Service         Attending Physician: Dr. Cyndie ChimeGranfortuna, Genene ChurnJames M, MD    First Contact: Dr. Cleaster CorinSeawell Pager: [phone]  Second Contact: Dr. Evelene CroonSantos Pager: [phone]       After Hours (After 5p/  First Contact Pager: [phone]  weekends / holidays): Second Contact Pager: [phone]   Chief Complaint: abdominal and back pain  History of Present Illness:  Mrs. Mickle Malloryema Simmons is a 42yo female with PMH hypertension, Type II diabetes, and pyelonephritis six months ago presenting today with bilateral flank and back pain, nausea, vomiting, and dizziness for the past three days. She states this happened once before about six months ago, and she was given antibiotics and improved. In addition to above symptoms she has had dysuria but continues make urine. No history of renal stones, she denies hematuria. Denies recent illness, travel, or sick contacts. She has not had diarrhea or changes in bowel movement. She states pain medication she has received has not helped much.   ED Course: Febrile to 103.3 decreased to 97.7, HR decreased from 123 to 93, blood pressure 89/65 s/p 5L NS bolus, started on ceftriaxone  Meds:  Current Meds  Medication Sig  . cyclobenzaprine (FLEXERIL) 5 MG tablet Take 1 tablet (5 mg total) by mouth 2 (two) times daily as needed for muscle spasms.  . metFORMIN (GLUCOPHAGE) 500 MG tablet Take 2 tablets (1,000 mg total) by mouth 2 (two) times daily with a meal.     Allergies: Allergies as of 01/03/2018 - Review Complete 01/03/2018  Allergen Reaction Noted  . Penicillins Itching 07/15/2017   Past Medical History:  Diagnosis Date  . Diabetes mellitus without complication (HCC)     Family History: n/a  Social History:  Lives at home with her husband  Does not smoke  or drink alcohol or use recreational drugs  Review of Systems: A complete ROS was negative except as per HPI.   Physical Exam: Blood pressure (!) 89/65, pulse 94, temperature 97.7 F (36.5 C), temperature source Oral, resp. rate 18, last menstrual period 12/20/2017, SpO2 98 %.  Constitution: mild distressed, acutely ill appearing HEENT: no scleral icterus, EOM intact Cardio: tachycardic, regular rhythm, no m/r/g Respiratory: CTAB, no wheezing or rales Abdominal: Diffusely TTP, + rebound tenderness, non-distended, +BS Neuro: a&ox3, cooperative, normal affect Skin: c/d/i, no pitting edema    EKG: personally reviewed my interpretation is sinus tachycardia with t-wave inversion V1  CT Abdomen with Contrast IMPRESSION: 1. Striated enhancement pattern of both kidneys, left greater than right compatible with changes of pyelonephritis. More confluent hypoattenuation in the lower pole of the left kidney may represent early lobar nephronia but this is inconclusive at this time. 2. Thick-walled appearance of the urinary bladder which likely reflects concomitant cystitis. 3. Fibroid uterus. 4. Hepatic steatosis.  Uncomplicated cholelithiasis. 5. Calcified granuloma at the right lung base. Electronically Signed   By: Tollie Ethavid  Kwon M.D.   On: 01/03/2018 14:20  Assessment & Plan by Problem: Active Problems:   Sepsis [facility](HCC)  42yo female with PMH hypertension, Type II diabetes, and pyelonephritis six months ago presenting today with bilateral flank and back pain, nausea, vomiting, and dizziness for the  past three days.  Sepsis 2/2 Pyelonephritis Febrile to 103 at presentation, now resolved. Tachycardic with persistent hypotension at admission s/p 5L NS, now beginning to improve. Lactate wnl. Bilateral flank pain with consistent findings on CT as well as hemoglobin, leukocytes and small amount of bacteria. Discussed case with CCM, and she is currently stable for step down admission as blood  pressures are improving and no signs of end organ damage. Second 500cc bolus ordered as well.   - continue ceftriaxone  - am CBC, BMP, Mg, ionized Ca - zofran prn nausea  - NS 125cc/hr for 24 hours - admit to step down  - strict I/O, daily weights  - dilaudid .5 mg prn q4h pain  TIIDM On metformin 500 mg bid, previously on glipizide but currently not taking   - Hba1c - SSI  Diet: carb modified  VTE: lovenox  IVF: 12 Code: full  Dispo: Admit patient to Inpatient with expected length of stay greater than 2 midnights.  SignedVersie Starks, DO 01/03/2018, 4:12 PM  Pager: [phone]

## 2018-01-04 ENCOUNTER — Inpatient Hospital Stay (HOSPITAL_COMMUNITY): Payer: Self-pay

## 2018-01-04 DIAGNOSIS — E876 Hypokalemia: Secondary | ICD-10-CM

## 2018-01-04 DIAGNOSIS — N12 Tubulo-interstitial nephritis, not specified as acute or chronic: Secondary | ICD-10-CM

## 2018-01-04 DIAGNOSIS — R7881 Bacteremia: Secondary | ICD-10-CM

## 2018-01-04 DIAGNOSIS — B9620 Unspecified Escherichia coli [E. coli] as the cause of diseases classified elsewhere: Secondary | ICD-10-CM

## 2018-01-04 LAB — CBC
HCT: 37.5 % (ref 36.0–46.0)
Hemoglobin: 12.9 g/dL (ref 12.0–15.0)
MCH: 31.1 pg (ref 26.0–34.0)
MCHC: 34.4 g/dL (ref 30.0–36.0)
MCV: 90.4 fL (ref 80.0–100.0)
NRBC: 0 % (ref 0.0–0.2)
PLATELETS: 266 10*3/uL (ref 150–400)
RBC: 4.15 MIL/uL (ref 3.87–5.11)
RDW: 11.9 % (ref 11.5–15.5)
WBC: 13.2 10*3/uL — AB (ref 4.0–10.5)

## 2018-01-04 LAB — BASIC METABOLIC PANEL
ANION GAP: 9 (ref 5–15)
BUN: <5 mg/dL — ABNORMAL LOW (ref 6–20)
CALCIUM: 7.1 mg/dL — AB (ref 8.9–10.3)
CO2: 18 mmol/L — ABNORMAL LOW (ref 22–32)
Chloride: 106 mmol/L (ref 98–111)
Creatinine, Ser: 0.49 mg/dL (ref 0.44–1.00)
GFR calc Af Amer: >60 mL/min (ref >60)
GLUCOSE: 220 mg/dL — AB (ref 70–99)
Potassium: 2.9 mmol/L — ABNORMAL LOW (ref 3.5–5.1)
SODIUM: 133 mmol/L — AB (ref 135–145)

## 2018-01-04 LAB — BLOOD CULTURE ID PANEL (REFLEXED)
Acinetobacter baumannii: NOT DETECTED
CANDIDA GLABRATA: NOT DETECTED
CANDIDA KRUSEI: NOT DETECTED
CANDIDA TROPICALIS: NOT DETECTED
Candida albicans: NOT DETECTED
Candida parapsilosis: NOT DETECTED
Carbapenem resistance: NOT DETECTED
ESCHERICHIA COLI: DETECTED — AB
Enterobacter cloacae complex: NOT DETECTED
Enterobacteriaceae species: DETECTED — AB
Enterococcus species: NOT DETECTED
Haemophilus influenzae: NOT DETECTED
KLEBSIELLA OXYTOCA: NOT DETECTED
Klebsiella pneumoniae: NOT DETECTED
Listeria monocytogenes: NOT DETECTED
NEISSERIA MENINGITIDIS: NOT DETECTED
PROTEUS SPECIES: NOT DETECTED
Pseudomonas aeruginosa: NOT DETECTED
SERRATIA MARCESCENS: NOT DETECTED
STAPHYLOCOCCUS SPECIES: NOT DETECTED
Staphylococcus aureus (BCID): NOT DETECTED
Streptococcus agalactiae: NOT DETECTED
Streptococcus pneumoniae: NOT DETECTED
Streptococcus pyogenes: NOT DETECTED
Streptococcus species: NOT DETECTED

## 2018-01-04 LAB — MRSA PCR SCREENING: MRSA BY PCR: NEGATIVE

## 2018-01-04 LAB — MAGNESIUM: MAGNESIUM: 1.7 mg/dL (ref 1.7–2.4)

## 2018-01-04 LAB — PHOSPHORUS: PHOSPHORUS: 1.6 mg/dL — AB (ref 2.5–4.6)

## 2018-01-04 LAB — GLUCOSE, CAPILLARY
Glucose-Capillary: 242 mg/dL — ABNORMAL HIGH (ref 70–99)
Glucose-Capillary: 270 mg/dL — ABNORMAL HIGH (ref 70–99)
Glucose-Capillary: 273 mg/dL — ABNORMAL HIGH (ref 70–99)
Glucose-Capillary: 267 mg/dL — ABNORMAL HIGH (ref 70–99)

## 2018-01-04 LAB — HEPATIC FUNCTION PANEL
ALK PHOS: 113 U/L (ref 38–126)
ALT: 57 U/L — ABNORMAL HIGH (ref 0–44)
AST: 55 U/L — ABNORMAL HIGH (ref 15–41)
Albumin: 2.4 g/dL — ABNORMAL LOW (ref 3.5–5.0)
BILIRUBIN TOTAL: 0.6 mg/dL (ref 0.3–1.2)
Total Protein: 5.9 g/dL — ABNORMAL LOW (ref 6.5–8.1)

## 2018-01-04 LAB — ECHOCARDIOGRAM COMPLETE: Weight: 1878.4 oz

## 2018-01-04 LAB — LIPASE, BLOOD: LIPASE: 20 U/L (ref 11–51)

## 2018-01-04 MED ORDER — POTASSIUM CHLORIDE CRYS ER 20 MEQ PO TBCR
40.0000 meq | EXTENDED_RELEASE_TABLET | Freq: Two times a day (BID) | ORAL | Status: AC
  Filled 2018-01-04: qty 2

## 2018-01-04 MED ORDER — POTASSIUM CHLORIDE CRYS ER 20 MEQ PO TBCR
40.0000 meq | EXTENDED_RELEASE_TABLET | Freq: Two times a day (BID) | ORAL | Status: AC
  Administered 2018-01-04 (×2): 40 meq via ORAL
  Filled 2018-01-04: qty 2

## 2018-01-04 MED ORDER — POTASSIUM PHOSPHATE MONOBASIC 500 MG PO TABS
1000.0000 mg | ORAL_TABLET | ORAL | Status: DC
  Filled 2018-01-04 (×4): qty 2

## 2018-01-04 MED ORDER — SODIUM CHLORIDE 0.9 % IV SOLN
2.0000 g | Freq: Every day | INTRAVENOUS | Status: DC
  Administered 2018-01-04 – 2018-01-05 (×3): 2 g via INTRAVENOUS
  Filled 2018-01-04 (×4): qty 20

## 2018-01-04 MED ORDER — INSULIN GLARGINE 100 UNIT/ML ~~LOC~~ SOLN
8.0000 [IU] | Freq: Every day | SUBCUTANEOUS | Status: DC
  Administered 2018-01-04: 8 [IU] via SUBCUTANEOUS
  Filled 2018-01-04: qty 0.08

## 2018-01-04 MED ORDER — K PHOS MONO-SOD PHOS DI & MONO 155-852-130 MG PO TABS
1000.0000 mg | ORAL_TABLET | ORAL | Status: AC
  Administered 2018-01-04 – 2018-01-05 (×4): 1000 mg via ORAL
  Filled 2018-01-04 (×4): qty 4

## 2018-01-04 MED ORDER — MAGNESIUM SULFATE IN D5W 1-5 GM/100ML-% IV SOLN
1.0000 g | Freq: Two times a day (BID) | INTRAVENOUS | Status: AC
  Administered 2018-01-04 (×2): 1 g via INTRAVENOUS
  Filled 2018-01-04 (×2): qty 100

## 2018-01-04 MED ORDER — LIVING WELL WITH DIABETES BOOK - IN SPANISH
Freq: Once | Status: DC
  Filled 2018-01-04: qty 1

## 2018-01-04 NOTE — Care Management Note (Addendum)
Case Management Note  Patient Details  Name: Katie Simmons MRN: [id number] Date of Birth: 09/20/1975  Subjective/Objective:   Pt presented for abdominal pain and back pain. Treating for pyelonephritis-IVF's and IV antibiotic therapy. PTA from home and she lives with a friend that works. Patient does not work and she is without insurance and PCP. Interpreter in the room to translate. Patient is agreeable to allow CM to schedule hospital follow up appointment.                 Action/Plan: [facility]. Patient will not have transportation so CM did provide patient with bus passes to get to office. Financial Counselor to speak with patient. No further needs from CM at this time.   Expected Discharge Date:             Expected Discharge Plan:  Home/Self Care  In-House Referral:  Artistinancial Counselor, Interpreting Services  Discharge planning Services  CM Consult, Follow-up appt scheduled, [facility], Medication Assistance  Post Acute Care Choice:  NA Choice offered to:  NA  DME Arranged:  N/A DME Agency:  NA  HH Arranged:  NA HH Agency:  NA  Status of Service:  Completed, signed off  If discussed at Long Length of Stay Meetings, dates discussed:    Additional Comments: 1430 01-05-18 Tomi BambergerBrenda Graves-Bigelow, RN,BSN [phone] CM did speak with patient via Video Interpreter and patient was made aware that medications have been [facility]. Rx's are now in the Main Pharmacy- Yellow form should be in the shadow chart. Patient may need transportation home on Thursday. Bus pass was given for MD appointments. No further needs from CM at this time.  Gala LewandowskyGraves-Bigelow, Dontarious Schaum Kaye, RN 01/04/2018, 12:07 PM

## 2018-01-04 NOTE — Progress Notes (Signed)
PHARMACY - PHYSICIAN COMMUNICATION CRITICAL VALUE ALERT - BLOOD CULTURE IDENTIFICATION (BCID)  Katie Simmons is an 42 y.o. female who [facility] with a chief complaint of abdominal pain  Assessment:  Blood culture indicates E. Coli  Name of physician (or Provider) Contacted: Left Sticky note  Current antibiotics: Rocephin  Changes to prescribed antibiotics recommended:   Will increase Rocephin 2 g IV q24h for bacteremia.  Results for orders placed or performed during the hospital encounter of 01/03/18  Blood Culture ID Panel (Reflexed) (Collected: 01/03/2018 10:50 AM)  Result Value Ref Range   Enterococcus species NOT DETECTED NOT DETECTED   Listeria monocytogenes NOT DETECTED NOT DETECTED   Staphylococcus species NOT DETECTED NOT DETECTED   Staphylococcus aureus (BCID) NOT DETECTED NOT DETECTED   Streptococcus species NOT DETECTED NOT DETECTED   Streptococcus agalactiae NOT DETECTED NOT DETECTED   Streptococcus pneumoniae NOT DETECTED NOT DETECTED   Streptococcus pyogenes NOT DETECTED NOT DETECTED   Acinetobacter baumannii NOT DETECTED NOT DETECTED   Enterobacteriaceae species DETECTED (A) NOT DETECTED   Enterobacter cloacae complex NOT DETECTED NOT DETECTED   Escherichia coli DETECTED (A) NOT DETECTED   Klebsiella oxytoca NOT DETECTED NOT DETECTED   Klebsiella pneumoniae NOT DETECTED NOT DETECTED   Proteus species NOT DETECTED NOT DETECTED   Serratia marcescens NOT DETECTED NOT DETECTED   Carbapenem resistance NOT DETECTED NOT DETECTED   Haemophilus influenzae NOT DETECTED NOT DETECTED   Neisseria meningitidis NOT DETECTED NOT DETECTED   Pseudomonas aeruginosa NOT DETECTED NOT DETECTED   Candida albicans NOT DETECTED NOT DETECTED   Candida glabrata NOT DETECTED NOT DETECTED   Candida krusei NOT DETECTED NOT DETECTED   Candida parapsilosis NOT DETECTED NOT DETECTED   Candida tropicalis NOT DETECTED NOT DETECTED    Eddie Candlebbott, Vermon Grays  [place] 01/04/2018  1:55 AM

## 2018-01-04 NOTE — Progress Notes (Signed)
  Echocardiogram 2D Echocardiogram has been performed.  Nyjae Hodge G Azhar Knope 01/04/2018, 11:50 AM

## 2018-01-04 NOTE — Progress Notes (Addendum)
Inpatient Diabetes Program Recommendations  AACE/ADA: New Consensus Statement on Inpatient Glycemic Control (2015)  Target Ranges:  Prepandial:   less than 140 mg/dL      Peak postprandial:   less than 180 mg/dL (1-2 hours)      Critically ill patients:  140 - 180 mg/dL   Lab Results  Component Value Date   GLUCAP 242 (H) 01/04/2018   HGBA1C 12.7 (H) 01/03/2018    Review of Glycemic Control Results for Katie Simmons, Katie Simmons (MRN [id number]) as of 01/04/2018 10:28  Ref. Range 01/03/2018 18:59 01/03/2018 21:06 01/04/2018 07:45  Glucose-Capillary Latest Ref Range: 70 - 99 mg/dL 178 (H) 194 (H) 242 (H)   Diabetes history: Type 2 DM Outpatient Diabetes medications: Metformin 1000 mg BID Current orders for Inpatient glycemic control: Novolog 0-9 units TID  Inpatient Diabetes Program Recommendations:    Consider adding Lantus 10 units QD.  Per PCP note on 07/11/2015, "Patient to follow up and if blood glucose not improving, will revisit the use of insulin." They were trialing oral antidiabetic medications and discussing insulin. Patient did not follow back up. However, anticipate need for insulin at discharge? Will place case management consult as patient will need PCP outpatient. Will plan to see patient today.    Addendum '@1300' : Spoke with patient using interpreter at bedside regarding diabetes management.  Patient was diagnosed several years ago but did not follow back up with PCP. Patient confirmed she was taking Metformin. Patient's understanding of DM is limited. Reviewed patient's current A1c of 12.7%. Explained what a A1c is and what it measures. Also reviewed goal A1c with patient, importance of good glucose control @ home, and blood sugar goals. Reviewed patho of DM, DKA, need for insulin, role of pancreas, hypo vs. Hyper glycemia, vascular changes and comorbidites.  Reviewed patient's diet and she admits to consuming large quantities of carbohydrates in meals. Reviewed  nutritional labels, how to count carbs, foods that were higher in sugar compared to better nutritional options, and provided handout that gave patient better idea of portion sizes. Dietitian consult placed. Encouraged patient to begin looking at labels and being mindful moving forward of consumption of foods that are higher in carbohydrates. Encouraged patient to begin checking blood sugars 2-3 times per day and take with her to next PCP appointment at [facility]. RN to help with self injections. Patient will need a meter at discharge. Blood glucose meter (includes lancets and strips) ([id number]) Educated patient on insulin pen use at home. Reviewed contents of insulin flexpen starter kit. Reviewed all steps if insulin pen including attachment of needle, 2-unit air shot, dialing up dose, giving injection, removing needle, disposal of sharps, storage of unused insulin, disposal of insulin etc. Will need to work with patient in order for her to provide successful return demonstration. Also reviewed troubleshooting with insulin pen. MD to give patient Rxs for insulin pens and insulin pen needles. Discussed plan with Dr Glenna Fellows for additional recommendations, current blood glucose trends and discharge plan for insulin. Orders provided and will continue to follow.  Thanks, Bronson Curb, MSN, RNC-OB Diabetes Coordinator [phone] (8a-5p)

## 2018-01-04 NOTE — Progress Notes (Signed)
   Subjective:  She is feeling much better today although still has right flank pain. She had some nausea this morning but no vomiting and is tolerating a diet this morning. She was febrile overnight and has chills. She is making urine but continues to have dysuria   Objective:  Vital signs in last 24 hours: Vitals:   01/04/18 0002 01/04/18 0456 01/04/18 0457 01/04/18 0747  BP:   114/76 98/67  Pulse:   (!) 109 (!) 103  Resp:   19 (!) 27  Temp:  (!) 101.6 F (38.7 C) (!) 101.6 F (38.7 C) 99.5 F (37.5 C)  TempSrc:   Oral Oral  SpO2:   98% 99%  Weight: 53.3 kg      Physical Exam Constitution: NAD, sitting up eating breakfast, appears improved HEENT: no scleral icterus, EOM intact Cardio: tachycardic, regular rhythm, no m/r/g Respiratory: CTAB, no w/r/r Abdominal: Right CVA tenderness, soft, non-distended, TTP right Neuro: a&o, pleasant, normal affect Skin: c/d/i    Assessment/Plan:  Active Problems:   Sepsis (HCC)   Diabetes mellitus without complication (HCC)  42yo female with PMH hypertension, Type II diabetes, and pyelonephritis six months ago presenting today with bilateral flank and back pain, nausea, vomiting, and dizziness for the past three days.  E. Coli Bacteremia 2/2 pyelonephritis Hypotension resolved although bp remains soft with continued fluids overnight, fever overnight with tachycardia. Symptomatically improved and tolerating a regular diet although still has significant CVA tenderness. Blood cultures x2 positive for E. Coli., sensitivities pending. Urine culture currently negative. CTX increased from 1g to 2g and will consider broadening coverage pending sensitivities as leukocytosis unchanged after 5.5L NS bolus yesterday.   - cont. CTX - am CBC - follow-up sensitivities - f/u urine culture  - compazine prn nausea   Hypocalcemia, Hypokalemia, Hypophosphatemia Likely secondary to large fluid bolus yesterday of 5.5 L. Corrected Ca 7.7. Magnesium  1.7  - Kphos, potassium chloride - TUMS 1000 mg bid  - 2 g Mg - am CMP, Mg, Phos  VTE: lovenox IVF: 125 cc/hr NS six hours Diet: carb modified Code: full   Dispo: Anticipated discharge in approximately 3-4 days.   Guinevere ScarletSeawell, Karmel Patricelli A, DO 01/04/2018, 9:47 AM Pager: [phone]

## 2018-01-05 LAB — COMPREHENSIVE METABOLIC PANEL
ALK PHOS: 108 U/L (ref 38–126)
ALT: 38 U/L (ref 0–44)
AST: 20 U/L (ref 15–41)
Albumin: 2.4 g/dL — ABNORMAL LOW (ref 3.5–5.0)
Anion gap: 8 (ref 5–15)
BILIRUBIN TOTAL: 0.5 mg/dL (ref 0.3–1.2)
CALCIUM: 7.7 mg/dL — AB (ref 8.9–10.3)
CHLORIDE: 104 mmol/L (ref 98–111)
CO2: 23 mmol/L (ref 22–32)
CREATININE: 0.58 mg/dL (ref 0.44–1.00)
GFR calc Af Amer: >60 mL/min (ref >60)
Glucose, Bld: 272 mg/dL — ABNORMAL HIGH (ref 70–99)
Potassium: 3.5 mmol/L (ref 3.5–5.1)
Sodium: 135 mmol/L (ref 135–145)
Total Protein: 6.2 g/dL — ABNORMAL LOW (ref 6.5–8.1)

## 2018-01-05 LAB — CBC
HCT: 37.6 % (ref 36.0–46.0)
Hemoglobin: 12.8 g/dL (ref 12.0–15.0)
MCH: 30.5 pg (ref 26.0–34.0)
MCHC: 34 g/dL (ref 30.0–36.0)
MCV: 89.5 fL (ref 80.0–100.0)
PLATELETS: 279 10*3/uL (ref 150–400)
RBC: 4.2 MIL/uL (ref 3.87–5.11)
RDW: 11.9 % (ref 11.5–15.5)
WBC: 8.6 10*3/uL (ref 4.0–10.5)
nRBC: 0 % (ref 0.0–0.2)

## 2018-01-05 LAB — URINE CULTURE

## 2018-01-05 LAB — PHOSPHORUS: Phosphorus: 2.9 mg/dL (ref 2.5–4.6)

## 2018-01-05 LAB — CULTURE, BLOOD (ROUTINE X 2): SPECIAL REQUESTS: ADEQUATE

## 2018-01-05 LAB — MAGNESIUM: MAGNESIUM: 2.1 mg/dL (ref 1.7–2.4)

## 2018-01-05 LAB — GLUCOSE, CAPILLARY
GLUCOSE-CAPILLARY: 250 mg/dL — AB (ref 70–99)
Glucose-Capillary: 208 mg/dL — ABNORMAL HIGH (ref 70–99)
Glucose-Capillary: 204 mg/dL — ABNORMAL HIGH (ref 70–99)
Glucose-Capillary: 187 mg/dL — ABNORMAL HIGH (ref 70–99)

## 2018-01-05 MED ORDER — CIPROFLOXACIN HCL 500 MG PO TABS: 500.0000 mg | ORAL_TABLET | Freq: Two times a day (BID) | ORAL | 0 refills | Status: AC

## 2018-01-05 MED ORDER — METFORMIN HCL 500 MG PO TABS: 1000.0000 mg | ORAL_TABLET | Freq: Two times a day (BID) | ORAL | 0 refills | Status: DC

## 2018-01-05 MED ORDER — INSULIN ASPART 100 UNIT/ML ~~LOC~~ SOLN
0.0000 [IU] | Freq: Three times a day (TID) | SUBCUTANEOUS | Status: DC
  Administered 2018-01-05: 5 [IU] via SUBCUTANEOUS
  Administered 2018-01-05: 3 [IU] via SUBCUTANEOUS
  Administered 2018-01-05: 5 [IU] via SUBCUTANEOUS
  Administered 2018-01-06: 3 [IU] via SUBCUTANEOUS
  Administered 2018-01-06: 5 [IU] via SUBCUTANEOUS

## 2018-01-05 MED ORDER — INSULIN ASPART 100 UNIT/ML ~~LOC~~ SOLN
3.0000 [IU] | Freq: Three times a day (TID) | SUBCUTANEOUS | Status: DC
  Administered 2018-01-05 – 2018-01-06 (×5): 3 [IU] via SUBCUTANEOUS

## 2018-01-05 MED ORDER — INSULIN GLARGINE 100 UNIT/ML ~~LOC~~ SOLN
10.0000 [IU] | Freq: Every day | SUBCUTANEOUS | Status: DC
  Administered 2018-01-05: 10 [IU] via SUBCUTANEOUS
  Filled 2018-01-05 (×2): qty 0.1

## 2018-01-05 NOTE — Progress Notes (Signed)
   Subjective:   [name] continues to improve and is eating well. She has not felt feverish, had recurrence of nausea, vomiting, dizziness, and dysuria has resolved.   Objective:  Vital signs in last 24 hours: Vitals:   01/04/18 1555 01/04/18 2105 01/05/18 0018 01/05/18 0421  BP: 117/82 132/84 128/86 109/72  Pulse: (!) 109 99 (!) 103 88  Resp: (!) _0 Temp: 100.2 F (37.9 C) 99.6 F (37.6 C) 100.1 F (37.8 C) 98.4 F (36.9 C)  TempSrc: Oral Oral Oral Oral  SpO2: 97% 100% 98% 98%  Weight:    50.4 kg   Physical Exam Constitution: NAD, eating in bed Cardio: RRR, no m/r/g Respiratory: CTAB, no wheezing or rales Abdominal: NTTP, +BS, non-distended, soft, mild CVA tenderness right, none on left Skin: c/d/i    Assessment/Plan:  Active Problems:   Sepsis ([place])   Diabetes mellitus without complication ([place])  15AX female with PMH hypertension, Type II diabetes, and pyelonephritis six months ago presenting today with bilateral flank and back pain, nausea, vomiting, and dizziness for the past three days.  E. Coli Bacteremia 2/2 pyelonephritis Continues to improve. Afebrile overnight. Sensitivities show susceptibility to ceftriaxone and cipro, which has good oral penetration for bacteremia. Will switch to oral ciprofloxacin with [facility] pharmacy for anticipated discharge tomorrow if she remains afebrile overnight.   - cont. CTX - oral cipro tomorrow  - compazine prn nausea  Hypocalcemia, Hypokalemia, Hypophosphatemia Resolved  TIIDM She will likely need to begin insulin soon. We have discussed this with her, and she has met with the diabetes coordinator and is in agreement. Due to the holiday schedule, as she has not met with new PCP yet and difficulty with transportation we will defer beginning insulin to her PCP.   - cont. lantus 10U, 4U meal coverage, SSI   VTE: lovenox IVF: none Diet: carb modified Code: full   Dispo: Anticipated discharge approximately tomorrow.    Molli Hazard A, DO 01/05/2018, 11:47 AM Pager: [phone]

## 2018-01-05 NOTE — Plan of Care (Signed)
  Problem: Clinical Measurements: Goal: Respiratory complications will improve Outcome: Progressing Note:  No s/s of respiratory complications noted. Goal: Cardiovascular complication will be avoided Outcome: Progressing Note:  No s/s cardiovascular complication noted.   Problem: Activity: Goal: Risk for activity intolerance will decrease Outcome: Progressing Note:  Ambulates to bathroom and back to bed without difficulty.

## 2018-01-05 NOTE — Progress Notes (Signed)
Nutrition Consult-Diet Education  RD consulted for nutrition education regarding diabetes. Spoke with patient via Darrelyn HillockGraciella, in house Spanish interpreter.   Lab Results  Component Value Date   HGBA1C 12.7 (H) 01/03/2018    RD provided "Carbohydrate Counting for People with Diabetes" Spanish handout from the Academy of [employer] and Dietetics. Discussed different food groups and their effects on blood sugar, emphasizing carbohydrate-containing foods. Provided list of carbohydrates and recommended serving sizes of common foods.  Discussed importance of controlled and consistent carbohydrate intake throughout the day. Provided examples of ways to balance meals/snacks and encouraged intake of high-fiber, whole grain complex carbohydrates. Teach back method used.  Expect good compliance.  Body mass index is 24.91 kg/m. Pt meets criteria for normal weight based on current BMI.  Current diet order is CHO modified, patient is consuming approximately 100% of meals at this time. Labs and medications reviewed. No further nutrition interventions warranted at this time. RD contact information provided. If additional nutrition issues arise, please re-consult RD.  Joaquin CourtsKimberly Gurdeep Keesey, RD, LDN, CNSC Pager [phone] After Hours Pager [phone]

## 2018-01-06 ENCOUNTER — Encounter (HOSPITAL_COMMUNITY): Payer: Self-pay | Admitting: Radiology

## 2018-01-06 ENCOUNTER — Inpatient Hospital Stay (HOSPITAL_COMMUNITY): Payer: Self-pay

## 2018-01-06 DIAGNOSIS — B9620 Unspecified Escherichia coli [E. coli] as the cause of diseases classified elsewhere: Secondary | ICD-10-CM

## 2018-01-06 DIAGNOSIS — R7881 Bacteremia: Secondary | ICD-10-CM

## 2018-01-06 LAB — CULTURE, BLOOD (ROUTINE X 2): Special Requests: ADEQUATE

## 2018-01-06 LAB — CBC
HEMATOCRIT: 39.5 % (ref 36.0–46.0)
HEMOGLOBIN: 13.7 g/dL (ref 12.0–15.0)
MCH: 31.1 pg (ref 26.0–34.0)
MCHC: 34.7 g/dL (ref 30.0–36.0)
MCV: 89.6 fL (ref 80.0–100.0)
Platelets: 317 10*3/uL (ref 150–400)
RBC: 4.41 MIL/uL (ref 3.87–5.11)
RDW: 11.9 % (ref 11.5–15.5)
WBC: 5.8 10*3/uL (ref 4.0–10.5)
nRBC: 0 % (ref 0.0–0.2)

## 2018-01-06 LAB — BASIC METABOLIC PANEL
Anion gap: 8 (ref 5–15)
BUN: 5 mg/dL — ABNORMAL LOW (ref 6–20)
CHLORIDE: 104 mmol/L (ref 98–111)
CO2: 23 mmol/L (ref 22–32)
CREATININE: 0.41 mg/dL — AB (ref 0.44–1.00)
Calcium: 8.2 mg/dL — ABNORMAL LOW (ref 8.9–10.3)
GFR calc non Af Amer: >60 mL/min (ref >60)
Glucose, Bld: 242 mg/dL — ABNORMAL HIGH (ref 70–99)
Potassium: 3.5 mmol/L (ref 3.5–5.1)
Sodium: 135 mmol/L (ref 135–145)

## 2018-01-06 LAB — GLUCOSE, CAPILLARY
Glucose-Capillary: 222 mg/dL — ABNORMAL HIGH (ref 70–99)
Glucose-Capillary: 193 mg/dL — ABNORMAL HIGH (ref 70–99)

## 2018-01-06 LAB — CALCIUM, IONIZED: Calcium, Ionized, Serum: 4.2 mg/dL — ABNORMAL LOW (ref 4.5–5.6)

## 2018-01-06 MED ORDER — SODIUM CHLORIDE 0.9 % IV BOLUS
500.0000 mL | Freq: Once | INTRAVENOUS | Status: AC
  Administered 2018-01-06: 500 mL via INTRAVENOUS

## 2018-01-06 MED ORDER — IOHEXOL 300 MG/ML  SOLN
100.0000 mL | Freq: Once | INTRAMUSCULAR | Status: AC | PRN
  Administered 2018-01-06: 100 mL via INTRAVENOUS

## 2018-01-06 NOTE — Discharge Summary (Signed)
Name: Katie Simmons MRN: [id number] DOB: 1975/10/08 42 y.o. PCP: Patient, No Pcp Per  Date of Admission: 01/03/2018 10:36 AM Date of Discharge: 01/06/2018 Attending Physician: No att. providers found  Discharge Diagnosis: 1. E. Coli Bacteremia 2/2 Pyelonephritis  Discharge Medications: Allergies as of 01/06/2018      Reactions   Penicillins Itching   Has patient had a PCN reaction causing immediate rash, facial/tongue/throat swelling, SOB or lightheadedness with hypotension: No Has patient had a PCN reaction causing severe rash involving mucus membranes or skin necrosis: No Has patient had a PCN reaction that required hospitalization: No Has patient had a PCN reaction occurring within the last 10 years: Yes If all of the above answers are "NO", then may proceed with Cephalosporin use.      Medication List    STOP taking these medications   cephALEXin 500 MG capsule Commonly known as:  KEFLEX   fluconazole 150 MG tablet Commonly known as:  DIFLUCAN   potassium chloride SA 20 MEQ tablet Commonly known as:  K-DUR,KLOR-CON     TAKE these medications   ciprofloxacin 500 MG tablet Commonly known as:  CIPRO Take 1 tablet (500 mg total) by mouth 2 (two) times daily for 4 days.   cyclobenzaprine 5 MG tablet Commonly known as:  FLEXERIL Take 1 tablet (5 mg total) by mouth 2 (two) times daily as needed for muscle spasms.   glipiZIDE 5 MG 24 hr tablet Commonly known as:  GLUCOTROL XL Take 1 tablet (5 mg total) by mouth daily with breakfast.   glucose blood test strip Use as instructed   ibuprofen 600 MG tablet Commonly known as:  ADVIL,MOTRIN Take 1 tablet (600 mg total) by mouth every 6 (six) hours as needed.   metFORMIN 500 MG tablet Commonly known as:  GLUCOPHAGE Take 2 tablets (1,000 mg total) by mouth 2 (two) times daily with a meal.   TRUE METRIX METER w/Device Kit 1 each by Does not apply route 3 (three) times daily.   TRUEPLUS LANCETS 28G  Misc 1 each by Does not apply route 3 (three) times daily.       Disposition and follow-up:   Katie Simmons was discharged from [facility] in Stable condition.  At the hospital follow up visit please address:  1.  E. Coli Bacteremia 2/2 pyelonephritis: discharged with ciprofloxacin for four more days to complete treatement. Resolution of sx at discharge Type II Diabetes: A1C decreased but still 12.7. On metformin 500 mg bid but will need increase. Required 10U lantus, 4U novolog with sliding scale as well. Did not discharge with insulin as it was the holidays and would not be able to call new PCP office if she had nay issues with treatment. Would recommend starting insulin regimen and she was open to this.   2.  Labs / imaging needed at time of follow-up: glucose  3.  Pending labs/ test needing follow-up: none  Follow-up Appointments: Follow-up Information    Clent Demark, PA-C. Go on 01/21/2018.   Specialty:  Physician Assistant Why:  @ 10:30 am for hospital follow up appointment. If you are unable to make this scheduled appointment- please call the office to reschedule.  Contact information: [place] [address] [phone]        [place]. Go to.   Why:  this location for medication assistance. Meidcations will range in cost from $4.00-$10.00.  Contact information: [address]  [place] [address] [phone]          Hospital Course by problem list: 42yo female with PMH hypertension, Type II diabetes, and pyelonephritis six months ago who presented with bilateral flank and back pain, nausea, vomiting, and dizziness for three days found to have bilateral pyelonephritis and focal pyelonephritis as well on CT. She was hypotensive at admission and PCCM was consulted. She became normotensive after 5.5L NS and did not require ICU admission. She was treated with  ceftriaxone and blood culture and urine culture were positive for E. Coli sensitive to ceftriaxone and ciprofloxacin. After resolution of symptoms she was discharged with 4 days more cipro. On day of discharge she had recurrence of nausea and vomiting. Repeat CT scan was done which showed no change, her symptoms resolved, and she was discharged.   TIIDM Taking metformin 500 mg bid. A1C 12.7 from 14 in July. She required 10U lantus, 4U novolog meal coverage and moderate sliding scale. Would recommend additional medication for glucose control. We did not start insulin as she would not have easy follow-up over the holiday and would be new to insulin medication. She is open to starting insulin therapy.   Discharge Vitals:   BP 101/75 (BP Location: Left Arm)   Pulse 81   Temp 98.3 F (36.8 C) (Oral)   Resp 18   Wt 50.4 kg   LMP 12/20/2017 (LMP Unknown)   SpO2 100%   BMI 24.91 kg/m   Pertinent Labs, Studies, and Procedures:    CT Scan 11/25 1. Striated enhancement pattern of both kidneys, left greater than right compatible with changes of pyelonephritis. More confluent hypoattenuation in the lower pole of the left kidney may represent early lobar nephronia but this is inconclusive at this time. 2. Thick-walled appearance of the urinary bladder which likely reflects concomitant cystitis. 3. Fibroid uterus. 4. Hepatic steatosis.  Uncomplicated cholelithiasis. 5. Calcified granuloma at the right lung base.   Electronically Signed   By: Ashley Royalty M.D.   On: 01/03/2018 14:20  CT Scan 11/29 Patchy LEFT nephrogram consistent with pyelonephritis, unchanged. No evidence of renal mass or abscess. Mild bladder wall thickening question underdistention artifact versus cystitis.  Cholelithiasis. Small amount of nonspecific free pelvic fluid, potentially physiologic. Electronically Signed   By: Lavonia Dana M.D.   On: 01/06/2018 11:34  Discharge Instructions: Discharge Instructions      Diet - low sodium heart healthy   Complete by:  As directed    Discharge instructions   Complete by:  As directed    [place],  Estabas en el hospital porque tuviste pyelonephritis, un infeccion de los rinons. Gracias para permitimos cuidar para ti.   Te prescribo Ciprofloxacin 500 mg por tu infeccion. Toma un pastilla cada 12 horas, empezando esta noche.   Te prescribio tu metformin tambien.   Por favor, necesitas ir a tu cita con [facility] el 13 de [place], 10:30 am.   Si tienes much dolor de la espalda otra vez, mucho dolor con [place], o fiebre, regrasa al departamento de emergencias.   Increase activity slowly   Complete by:  As directed       Signed: Molli Hazard A, DO 01/09/2018, 1:22 PM   Pager: [phone]

## 2018-01-06 NOTE — Progress Notes (Signed)
   Subjective:  Increased upper abdominal pain, nausea, and vomiting x2 overnight. She states the vomit was yellow in color and wasn't much. She has felt feverish again overnight, chills, and decreased appetite. She has been having BM. No dysuria, no SOB or cough.   Objective:  Vital signs in last 24 hours: Vitals:   01/05/18 0018 01/05/18 0421 01/05/18 1150 01/05/18 1952  BP: 128/86 109/72 114/76 117/78  Pulse: (!) 103 88 87 83  Resp: 18 18 18    Temp: 100.1 F (37.8 C) 98.4 F (36.9 C) 98 F (36.7 C) 98 F (36.7 C)  TempSrc: Oral Oral Oral Oral  SpO2: 98% 98% 100% 98%  Weight:  50.4 kg     Physical Exam: Constitution: NAD, supine in bed HEENT: no scleral icterus, EOM intact, moist mucous membranes Cardio: RRR, no m/r/g Respiratory: CTAB, no wheezing or rales Abdominal: +BS, TTP upper quadrant bilaterally, nondistended, soft, no CVA tenderness Skin: c/d/i   Assessment/Plan:  Active Problems:   Sepsis (HCC)   Diabetes mellitus without complication (HCC)  42yo female with PMH hypertension, Type II diabetes, and pyelonephritis six months ago presenting today with bilateral flank and back pain, nausea, vomiting, and dizziness for the past three days.  E. Coli Bacteremia 2/2 pyelonephritis Recurrence of symptoms similar to admission occurring overnight. Subjective fevers although no fever recorded. She is tender in the abdomen bilaterally, no CVA tenderness or dysuria. CT scan at admission showed nephronia significant for focal pyelonephritis, which can be beginning of abscess. Blood cultures sensitive to ceftriaxone and today is day 4. Repeat labs do not show leukocytosis or AKI.   - cont CTX - repeat CT - zofran prn nausea   TIIDM Cont. lantus 10U, 4U novolog with meals, SSI-moderate  VTE: lovenox IVF: none Diet: carb modified Code: full  Dispo: Anticipated discharge in approximately pending CT scan.   Versie StarksSeawell, Channa Hazelett A, DO 01/06/2018, 6:43 AM Pager: [phone]

## 2018-01-06 NOTE — Plan of Care (Signed)
Plan d/c to home.

## 2018-01-21 ENCOUNTER — Inpatient Hospital Stay (INDEPENDENT_AMBULATORY_CARE_PROVIDER_SITE_OTHER): Payer: No Typology Code available for payment source | Admitting: Physician Assistant

## 2018-10-10 ENCOUNTER — Ambulatory Visit (HOSPITAL_COMMUNITY)
Admission: EM | Admit: 2018-10-10 | Discharge: 2018-10-10 | Disposition: A | Discharge status: Home / Self Care | Payer: Self-pay | Attending: Emergency Medicine | Admitting: Emergency Medicine

## 2018-10-10 ENCOUNTER — Other Ambulatory Visit: Payer: Self-pay

## 2018-10-10 ENCOUNTER — Encounter (HOSPITAL_COMMUNITY): Payer: Self-pay

## 2018-10-10 ENCOUNTER — Ambulatory Visit (HOSPITAL_COMMUNITY): Payer: Self-pay

## 2018-10-10 ENCOUNTER — Ambulatory Visit (INDEPENDENT_AMBULATORY_CARE_PROVIDER_SITE_OTHER): Payer: Self-pay

## 2018-10-10 DIAGNOSIS — M25561 Pain in right knee: Secondary | ICD-10-CM

## 2018-10-10 MED ORDER — NAPROXEN 500 MG PO TABS: 500.0000 mg | ORAL_TABLET | Freq: Two times a day (BID) | ORAL | 0 refills | Status: DC

## 2018-10-10 NOTE — ED Triage Notes (Signed)
Pt states she has right knee pain x 1 month. The pain radiates up and down her leg.

## 2018-10-10 NOTE — Discharge Instructions (Addendum)
Xray fue normal [place] naprosyn 2 veces cada dia para los proximas semanas Regrese si no mejoran

## 2018-10-11 NOTE — ED Provider Notes (Signed)
[place]    CSN: [id number] Arrival date & time: 10/10/18  1402      History   Chief Complaint Chief Complaint  Patient presents with  . Knee Pain    HPI Spanish interpreter via [employer] interpreter [name] Oliva Montecalvo is a 43 y.o. female history of DM type II, presenting today for evaluation of right knee pain.  Patient states that over the past month she has had right knee pain.  Pain will radiate up into her thigh area.  Occasionally will have some pins and needle sensation.  Denies any injury fall or increase in activity.  Denies previous history of similar issues with her knee.  She states that she took some tetracycline/antibiotics without relief.  She is not taking any anti-inflammatories.  Patient states that she does not do strenuous and denies being on her feet for most of the day.  HPI  Past Medical History:  Diagnosis Date  . Diabetes mellitus without complication [facility])     Patient Active Problem List   Diagnosis Date Noted  . E coli bacteremia 01/06/2018  . Sepsis ([place]) 01/03/2018  . Diabetes mellitus without complication ([place]) 94/49/6759  . Pyelonephritis   . Hypotension     History reviewed. No pertinent surgical history.  OB History   No obstetric history on file.      Home Medications    Prior to Admission medications   Medication Sig Start Date End Date Taking? Authorizing Provider  Blood Glucose Monitoring Suppl (TRUE METRIX METER) w/Device KIT 1 each by Does not apply route 3 (three) times daily. 06/28/15   Argentina Donovan, PA-C  cyclobenzaprine (FLEXERIL) 5 MG tablet Take 1 tablet (5 mg total) by mouth 2 (two) times daily as needed for muscle spasms. 10/20/17   Ward, Ozella Almond, PA-C  glucose blood (TRUE METRIX BLOOD GLUCOSE TEST) test strip Use as instructed 06/28/15   Argentina Donovan, PA-C  metFORMIN (GLUCOPHAGE) 500 MG tablet Take 2 tablets (1,000 mg total) by mouth 2 (two) times daily with a meal. 01/05/18   Seawell,  Jaimie A, DO  naproxen (NAPROSYN) 500 MG tablet Take 1 tablet (500 mg total) by mouth 2 (two) times daily. 10/10/18   Wieters, Hallie C, PA-C  TRUEPLUS LANCETS 28G MISC 1 each by Does not apply route 3 (three) times daily. 06/28/15   Argentina Donovan, PA-C  glipiZIDE (GLIPIZIDE XL) 5 MG 24 hr tablet Take 1 tablet (5 mg total) by mouth daily with breakfast. Patient not taking: Reported on 01/03/2018 08/20/17 10/10/18  Elsie Stain, MD    Family History History reviewed. No pertinent family history.  Social History Social History   Tobacco Use  . Smoking status: Never Smoker  . Smokeless tobacco: Never Used  Substance Use Topics  . Alcohol use: No    Alcohol/week: 0.0 standard drinks  . Drug use: No     Allergies   Penicillins   Review of Systems Review of Systems  Constitutional: Negative for fatigue and fever.  Eyes: Negative for visual disturbance.  Respiratory: Negative for shortness of breath.   Cardiovascular: Negative for chest pain.  Gastrointestinal: Negative for abdominal pain, nausea and vomiting.  Musculoskeletal: Positive for arthralgias, gait problem and myalgias. Negative for joint swelling.  Skin: Negative for color change, rash and wound.  Neurological: Negative for dizziness, weakness, light-headedness and headaches.     Physical Exam Triage Vital Signs ED Triage Vitals  Enc Vitals Group     BP 10/10/18  1430 124/74     Pulse Rate 10/10/18 1430 91     Resp 10/10/18 1430 16     Temp 10/10/18 1430 98.2 F (36.8 C)     Temp Source 10/10/18 1430 Oral     SpO2 10/10/18 1430 100 %     Weight 10/10/18 1437 114 lb (51.7 kg)     Height --      Head Circumference --      Peak Flow --      Pain Score 10/10/18 1437 8     Pain Loc --      Pain Edu? --      Excl. in [place]? --    No data found.  Updated Vital Signs BP 124/74 (BP Location: Right Arm)   Pulse 91   Temp 98.2 F (36.8 C) (Oral)   Resp 16   Wt 114 lb (51.7 kg)   LMP 09/19/2018   SpO2  100%   BMI 25.56 kg/m   Visual Acuity Right Eye Distance:   Left Eye Distance:   Bilateral Distance:    Right Eye Near:   Left Eye Near:    Bilateral Near:     Physical Exam Vitals signs and nursing note reviewed.  Constitutional:      Appearance: She is well-developed.     Comments: No acute distress  HENT:     Head: Normocephalic and atraumatic.     Nose: Nose normal.  Eyes:     Conjunctiva/sclera: Conjunctivae normal.  Neck:     Musculoskeletal: Neck supple.  Cardiovascular:     Rate and Rhythm: Normal rate.  Pulmonary:     Effort: Pulmonary effort is normal. No respiratory distress.  Abdominal:     General: There is no distension.  Musculoskeletal: Normal range of motion.     Comments: Right knee: No obvious swelling erythema or deformity to right knee.  Tenderness diffusely around knee, more prominent to medial aspect and suprapatellar area, tenderness does extend more proximally into the groin, strength 5/5 and hips knee, no tenderness to popliteal area or extending into calf Slightly limited flexion at extremes, full extension No laxity appreciated he had a recent negative stress, negative [place]  Skin:    General: Skin is warm and dry.  Neurological:     Mental Status: She is alert and oriented to person, place, and time.      UC Treatments / Results  Labs (all labs ordered are listed, but only abnormal results are displayed) Labs Reviewed - No data to display  EKG   Radiology Dg Knee Ap/lat W/sunrise Right  Result Date: 10/10/2018 CLINICAL DATA:  Right knee pain.  No injury. EXAM: RIGHT KNEE 3 VIEWS COMPARISON:  None. FINDINGS: No acute bony or joint abnormality. No evidence of fracture or dislocation. IMPRESSION: No acute abnormality. Electronically Signed   By: Marcello Moores  Register   On: 10/10/2018 15:42    Procedures Procedures (including critical care time)  Medications Ordered in UC Medications - No data to display  Initial Impression /  Assessment and Plan / UC Course  I have reviewed the triage vital signs and the nursing notes.  Pertinent labs & imaging results that were available during my care of the patient were reviewed by me and considered in my medical decision making (see chart for details).     Given tenderness and symptoms persisting for months opted for x-ray despite lacking mechanism of injury.  No sign of acute bony abnormality or significant underlying arthritis.  No overlying erythema, do not suspect gout or cellulitis/septic joint.  Patient has not used any anti-inflammatories, is diabetic.  Will start with Naprosyn twice daily, provided Ace wrap, ice and elevation.  Continue to monitor, Discussed strict return precautions. Patient verbalized understanding and is agreeable with plan.  Final Clinical Impressions(s) / UC Diagnoses   Final diagnoses:  Acute pain of right knee     Discharge Instructions     Xray fue normal [place] naprosyn 2 veces cada dia para los proximas semanas Regrese si no mejoran    ED Prescriptions    Medication Sig Dispense Auth. Provider   naproxen (NAPROSYN) 500 MG tablet Take 1 tablet (500 mg total) by mouth 2 (two) times daily. 30 tablet Wieters, [place] C, PA-C     Controlled Substance Prescriptions [place] Controlled Substance Registry consulted? Not Applicable   Janith Lima, [place] 10/11/18 [id number]

## 2019-07-24 ENCOUNTER — Other Ambulatory Visit: Payer: Self-pay

## 2019-07-24 ENCOUNTER — Encounter (HOSPITAL_COMMUNITY): Payer: Self-pay

## 2019-07-24 ENCOUNTER — Ambulatory Visit (HOSPITAL_COMMUNITY)
Admission: EM | Admit: 2019-07-24 | Discharge: 2019-07-24 | Disposition: A | Discharge status: Home / Self Care | Payer: No Typology Code available for payment source | Attending: Family Medicine | Admitting: Family Medicine

## 2019-07-24 ENCOUNTER — Encounter (HOSPITAL_COMMUNITY): Payer: Self-pay | Admitting: Emergency Medicine

## 2019-07-24 ENCOUNTER — Emergency Department (HOSPITAL_COMMUNITY)
Admission: EM | Admit: 2019-07-24 | Discharge: 2019-07-25 | Disposition: A | Discharge status: Home / Self Care | Payer: Self-pay | Attending: Emergency Medicine | Admitting: Emergency Medicine

## 2019-07-24 DIAGNOSIS — I889 Nonspecific lymphadenitis, unspecified: Secondary | ICD-10-CM

## 2019-07-24 DIAGNOSIS — M542 Cervicalgia: Secondary | ICD-10-CM | POA: Insufficient documentation

## 2019-07-24 DIAGNOSIS — Z7984 Long term (current) use of oral hypoglycemic drugs: Secondary | ICD-10-CM | POA: Insufficient documentation

## 2019-07-24 DIAGNOSIS — R221 Localized swelling, mass and lump, neck: Secondary | ICD-10-CM | POA: Insufficient documentation

## 2019-07-24 DIAGNOSIS — E119 Type 2 diabetes mellitus without complications: Secondary | ICD-10-CM | POA: Insufficient documentation

## 2019-07-24 DIAGNOSIS — Z79899 Other long term (current) drug therapy: Secondary | ICD-10-CM | POA: Insufficient documentation

## 2019-07-24 NOTE — ED Triage Notes (Signed)
Pt reports neck pain when moving to the sides x 2 months. Pt states she has "a ball" in the right sided of her neck.

## 2019-07-24 NOTE — Discharge Instructions (Signed)
Please be seen in the emergency department for further work up and consideration of imaging.   Please be seen in the emergency department for further work up and consideration of imaging.

## 2019-07-24 NOTE — ED Triage Notes (Signed)
Pt c/o neck pain and a "lump" that is getting bigger. Symptoms have been present x 2 months.

## 2019-07-24 NOTE — ED Provider Notes (Signed)
[place]    CSN: [id number] Arrival date & time: 07/24/19  1716      History   Chief Complaint Chief Complaint  Patient presents with  . Neck Pain    HPI [facility] Orbie Hurst Judge Stall is a 44 y.o. female.   She is presenting with pain in the lateral aspect of the neck.  She has fullness in the supraclavicular notch.  She has had nocturnal fevers for several days.  She has had the symptoms ongoing for the past 2 months.  Entirety of the interview was used with a [place] interpreter.  HPI  Past Medical History:  Diagnosis Date  . Diabetes mellitus without complication [facility])     Patient Active Problem List   Diagnosis Date Noted  . E coli bacteremia 01/06/2018  . Sepsis ([place]) 01/03/2018  . Diabetes mellitus without complication ([place]) 41/63/8453  . Pyelonephritis   . Hypotension     History reviewed. No pertinent surgical history.  OB History   No obstetric history on file.      Home Medications    Prior to Admission medications   Medication Sig Start Date End Date Taking? Authorizing Provider  Blood Glucose Monitoring Suppl (TRUE METRIX METER) w/Device KIT 1 each by Does not apply route 3 (three) times daily. 06/28/15   Argentina Donovan, PA-C  cyclobenzaprine (FLEXERIL) 5 MG tablet Take 1 tablet (5 mg total) by mouth 2 (two) times daily as needed for muscle spasms. 10/20/17   Ward, Ozella Almond, PA-C  glucose blood (TRUE METRIX BLOOD GLUCOSE TEST) test strip Use as instructed 06/28/15   Argentina Donovan, PA-C  metFORMIN (GLUCOPHAGE) 500 MG tablet Take 2 tablets (1,000 mg total) by mouth 2 (two) times daily with a meal. 01/05/18   Seawell, Jaimie A, DO  naproxen (NAPROSYN) 500 MG tablet Take 1 tablet (500 mg total) by mouth 2 (two) times daily. 10/10/18   Wieters, Hallie C, PA-C  TRUEPLUS LANCETS 28G MISC 1 each by Does not apply route 3 (three) times daily. 06/28/15   Argentina Donovan, PA-C  glipiZIDE (GLIPIZIDE XL) 5 MG 24 hr tablet Take 1 tablet (5 mg  total) by mouth daily with breakfast. Patient not taking: Reported on 01/03/2018 08/20/17 10/10/18  Elsie Stain, MD    Family History History reviewed. No pertinent family history.  Social History Social History   Tobacco Use  . Smoking status: Never Smoker  . Smokeless tobacco: Never Used  Substance Use Topics  . Alcohol use: No    Alcohol/week: 0.0 standard drinks  . Drug use: No     Allergies   Penicillins   Review of Systems Review of Systems  See hpi   Physical Exam Triage Vital Signs ED Triage Vitals  Enc Vitals Group     BP 07/24/19 1743 100/76     Pulse Rate 07/24/19 1743 91     Resp 07/24/19 1743 17     Temp 07/24/19 1743 97.6 F (36.4 C)     Temp Source 07/24/19 1743 Oral     SpO2 07/24/19 1743 100 %     Weight --      Height --      Head Circumference --      Peak Flow --      Pain Score 07/24/19 1741 6     Pain Loc --      Pain Edu? --      Excl. in [place]? --    No data found.  Updated Vital Signs BP 100/76 (BP Location: Left Arm)   Pulse 91   Temp 97.6 F (36.4 C) (Oral)   Resp 17   LMP 07/06/2019 (Exact Date)   SpO2 100%   Visual Acuity Right Eye Distance:   Left Eye Distance:   Bilateral Distance:    Right Eye Near:   Left Eye Near:    Bilateral Near:     Physical Exam Gen: NAD, alert, cooperative with exam, ENT: normal lips, normal nasal mucosa,  Eye: normal EOM, normal conjunctiva and lids CV:  no edema,  Resp: no accessory muscle use, non-labored,  Skin: no rashes, no areas of induration  Neuro: normal tone, normal sensation to touch Psych:  normal insight, alert and oriented MSK:  Fullness and tenderness with an enlarged lymph node in the right supraclavicular notch.  No streaking or redness. Limited lateral rotation to the right. Neurovascularly intact   UC Treatments / Results  Labs (all labs ordered are listed, but only abnormal results are displayed) Labs Reviewed - No data to  display  EKG   Radiology No results found.  Procedures Procedures (including critical care time)  Medications Ordered in UC Medications - No data to display  Initial Impression / Assessment and Plan / UC Course  I have reviewed the triage vital signs and the nursing notes.  Pertinent labs & imaging results that were available during my care of the patient were reviewed by me and considered in my medical decision making (see chart for details).     Ms. Judge Stall is a 44 year old female is presenting with a 31-monthhistory of pain in the right base of the lateral neck.  Has fullness in the supraclavicular notch and reports nocturnal fevers.  Concern for infection versus HIV versus upper extremity DVT versus tuberculosis.  Has not had any work-up done to this point.  Due to increasing pain and lack of primary care, advised to be seen in the emergency department for further lab work and consideration of imaging.  She understood and was in agreement.  Final Clinical Impressions(s) / UC Diagnoses   Final diagnoses:  Cervical lymphadenitis     Discharge Instructions     Please be seen in the emergency department for further work up and consideration of imaging.   Please be seen in the emergency department for further work up and consideration of imaging.     ED Prescriptions    None     PDMP not reviewed this encounter.   SRosemarie Ax MD 07/24/19 [phone]

## 2019-07-25 ENCOUNTER — Emergency Department (HOSPITAL_COMMUNITY): Payer: Self-pay

## 2019-07-25 ENCOUNTER — Emergency Department (HOSPITAL_BASED_OUTPATIENT_CLINIC_OR_DEPARTMENT_OTHER): Payer: Self-pay

## 2019-07-25 DIAGNOSIS — M79609 Pain in unspecified limb: Secondary | ICD-10-CM

## 2019-07-25 DIAGNOSIS — M7989 Other specified soft tissue disorders: Secondary | ICD-10-CM

## 2019-07-25 LAB — BASIC METABOLIC PANEL
Anion gap: 10 (ref 5–15)
BUN: 10 mg/dL (ref 6–20)
CO2: 21 mmol/L — ABNORMAL LOW (ref 22–32)
Calcium: 8.2 mg/dL — ABNORMAL LOW (ref 8.9–10.3)
Chloride: 104 mmol/L (ref 98–111)
Creatinine, Ser: 0.37 mg/dL — ABNORMAL LOW (ref 0.44–1.00)
GFR calc Af Amer: >60 mL/min (ref >60)
GFR calc non Af Amer: >60 mL/min (ref >60)
Glucose, Bld: 298 mg/dL — ABNORMAL HIGH (ref 70–99)
Potassium: 3.7 mmol/L (ref 3.5–5.1)
Sodium: 135 mmol/L (ref 135–145)

## 2019-07-25 LAB — CBC WITH DIFFERENTIAL/PLATELET
Abs Immature Granulocytes: 0.02 10*3/uL (ref 0.00–0.07)
Basophils Absolute: 0 10*3/uL (ref 0.0–0.1)
Basophils Relative: 1 %
Eosinophils Absolute: 0.1 10*3/uL (ref 0.0–0.5)
Eosinophils Relative: 2 %
HCT: 43 % (ref 36.0–46.0)
Hemoglobin: 15 g/dL (ref 12.0–15.0)
Immature Granulocytes: 0 %
Lymphocytes Relative: 41 %
Lymphs Abs: 2.3 10*3/uL (ref 0.7–4.0)
MCH: 32.1 pg (ref 26.0–34.0)
MCHC: 34.9 g/dL (ref 30.0–36.0)
MCV: 91.9 fL (ref 80.0–100.0)
Monocytes Absolute: 0.5 10*3/uL (ref 0.1–1.0)
Monocytes Relative: 9 %
Neutro Abs: 2.6 10*3/uL (ref 1.7–7.7)
Neutrophils Relative %: 47 %
Platelets: 256 10*3/uL (ref 150–400)
RBC: 4.68 MIL/uL (ref 3.87–5.11)
RDW: 12.3 % (ref 11.5–15.5)
WBC: 5.5 10*3/uL (ref 4.0–10.5)
nRBC: 0 % (ref 0.0–0.2)

## 2019-07-25 LAB — HIV ANTIBODY (ROUTINE TESTING W REFLEX): HIV Screen 4th Generation wRfx: NONREACTIVE

## 2019-07-25 LAB — TSH: TSH: 3.539 u[IU]/mL (ref 0.350–4.500)

## 2019-07-25 MED ORDER — NAPROXEN 500 MG PO TABS: 500.0000 mg | ORAL_TABLET | Freq: Two times a day (BID) | ORAL | 0 refills | Status: DC | PRN

## 2019-07-25 MED ORDER — IBUPROFEN 400 MG PO TABS
600.0000 mg | ORAL_TABLET | Freq: Once | ORAL | Status: AC
  Administered 2019-07-25: 600 mg via ORAL
  Filled 2019-07-25: qty 1

## 2019-07-25 NOTE — Discharge Instructions (Addendum)
Please schedule follow-up appointment with a primary care doctor, you can follow-up with the community health clinic.  If you feel that your pain or swelling is worsening, you develop chest pain or difficulty in breathing or other new concerns symptom, return to ER for reassessment.

## 2019-07-25 NOTE — ED Provider Notes (Signed)
[facility] EMERGENCY DEPARTMENT Provider Note   CSN: [id number] Arrival date & time: 07/24/19  1931     History Chief Complaint  Patient presents with  . Neck Pain    Katie Simmons is a 44 y.o. female.  Presents to ER with concern for right-sided neck pain, swelling.  Over the past 2 months has noted mild right-sided neck swelling as well as pain that extends from the side of her neck down her shoulder and right arm.  Worse with certain movements and certain positions.  Relatively dull, achy.  Has not taken any medicine for this.  No known injuries to area.  Has had chills at night during this time frame as well.  No night sweats.  Spanish-speaking only, utilized Art therapist.  Retired from [employer], has lived in [place] for the past 6 years.  Past medical history diabetes mellitus.  Denies prior history of DVT/PE, no other chronic medical conditions.  Denies any tobacco abuse, no IVDU, no history of HIV.  No known exposures to HIV, TB.    HPI     Past Medical History:  Diagnosis Date  . Diabetes mellitus without complication [facility])     Patient Active Problem List   Diagnosis Date Noted  . E coli bacteremia 01/06/2018  . Sepsis ([place]) 01/03/2018  . Diabetes mellitus without complication ([place]) 81/82/9937  . Pyelonephritis   . Hypotension     History reviewed. No pertinent surgical history.   OB History   No obstetric history on file.     No family history on file.  Social History   Tobacco Use  . Smoking status: Never Smoker  . Smokeless tobacco: Never Used  Substance Use Topics  . Alcohol use: No    Alcohol/week: 0.0 standard drinks  . Drug use: No    Home Medications Prior to Admission medications   Medication Sig Start Date End Date Taking? Authorizing Provider  Blood Glucose Monitoring Suppl (TRUE METRIX METER) w/Device KIT 1 each by Does not apply route 3 (three) times daily. 06/28/15   Argentina Donovan, PA-C    cyclobenzaprine (FLEXERIL) 5 MG tablet Take 1 tablet (5 mg total) by mouth 2 (two) times daily as needed for muscle spasms. 10/20/17   Ward, Ozella Almond, PA-C  glucose blood (TRUE METRIX BLOOD GLUCOSE TEST) test strip Use as instructed 06/28/15   Argentina Donovan, PA-C  metFORMIN (GLUCOPHAGE) 500 MG tablet Take 2 tablets (1,000 mg total) by mouth 2 (two) times daily with a meal. 01/05/18   Seawell, Jaimie A, DO  naproxen (NAPROSYN) 500 MG tablet Take 1 tablet (500 mg total) by mouth 2 (two) times daily as needed. 07/25/19   Lucrezia Starch, MD  TRUEPLUS LANCETS 28G MISC 1 each by Does not apply route 3 (three) times daily. 06/28/15   Argentina Donovan, PA-C  glipiZIDE (GLIPIZIDE XL) 5 MG 24 hr tablet Take 1 tablet (5 mg total) by mouth daily with breakfast. Patient not taking: Reported on 01/03/2018 08/20/17 10/10/18  Elsie Stain, MD    Allergies    Penicillins  Review of Systems   Review of Systems  Constitutional: Positive for chills. Negative for fever.  HENT: Negative for ear pain and sore throat.   Eyes: Negative for pain and visual disturbance.  Respiratory: Negative for cough and shortness of breath.   Cardiovascular: Negative for chest pain and palpitations.  Gastrointestinal: Negative for abdominal pain and vomiting.  Genitourinary: Negative for dysuria and  hematuria.  Musculoskeletal: Positive for arthralgias and neck pain. Negative for back pain.  Skin: Negative for color change and rash.  Neurological: Negative for seizures and syncope.  All other systems reviewed and are negative.    Physical Exam Updated Vital Signs BP 99/67   Pulse 72   Temp 97.8 F (36.6 C) (Oral)   Resp 16   LMP 07/06/2019 (Exact Date)   SpO2 100%   Physical Exam Vitals and nursing note reviewed.  Constitutional:      General: Katie Simmons is not in acute distress.    Appearance: Katie Simmons is well-developed.  HENT:     Head: Normocephalic and atraumatic.     Mouth/Throat:     Mouth: Mucous  membranes are moist.     Pharynx: Oropharynx is clear.  Eyes:     Conjunctiva/sclera: Conjunctivae normal.  Neck:     Comments: No cervical lymphadenopathy, no grossly enlarged supraclavicular or infraclavicular nodes; no mass, normal ROM Cardiovascular:     Rate and Rhythm: Normal rate and regular rhythm.     Heart sounds: No murmur heard.   Pulmonary:     Effort: Pulmonary effort is normal. No respiratory distress.     Breath sounds: Normal breath sounds.  Abdominal:     Palpations: Abdomen is soft.     Tenderness: There is no abdominal tenderness.  Musculoskeletal:        General: No deformity or signs of injury.     Cervical back: Normal range of motion and neck supple. No rigidity.     Comments: RUE: TTP over R shoulder, R lateral neck, no deformity, normal symetric radial pulse LUE: no TTP thorughout, normal radial pulse Neck: no midline C spine TTP  Skin:    General: Skin is warm and dry.     Capillary Refill: Capillary refill takes less than 2 seconds.  Neurological:     General: No focal deficit present.     Mental Status: Katie Simmons is alert and oriented to person, place, and time.  Psychiatric:        Mood and Affect: Mood normal.        Behavior: Behavior normal.     ED Results / Procedures / Treatments   Labs (all labs ordered are listed, but only abnormal results are displayed) Labs Reviewed  BASIC METABOLIC PANEL - Abnormal; Notable for the following components:      Result Value   CO2 21 (*)    Glucose, Bld 298 (*)    Creatinine, Ser 0.37 (*)    Calcium 8.2 (*)    All other components within normal limits  CBC WITH DIFFERENTIAL/PLATELET  HIV ANTIBODY (ROUTINE TESTING W REFLEX)  TSH    EKG None  Radiology DG Chest 2 View  Result Date: 07/25/2019 CLINICAL DATA:  Chest pain.  Shoulder pain.  Large lymph nodes. EXAM: CHEST - 2 VIEW COMPARISON:  CT 01/06/2018. FINDINGS: Mediastinum and hilar structures normal. Low lung volumes with mild bibasilar  atelectasis/infiltrates. Stable calcified nodule right lung base consistent granuloma. No pleural effusion or pneumothorax. Mild thoracic spine scoliosis concave left. IMPRESSION: Low lung volumes with mild bibasilar atelectasis/infiltrates. Electronically Signed   By: Marcello Moores  Register   On: 07/25/2019 08:44   DG Shoulder Right  Result Date: 07/25/2019 CLINICAL DATA:  Shoulder pain. EXAM: RIGHT SHOULDER - 2+ VIEW COMPARISON:  None. FINDINGS: No fracture. No subluxation or dislocation. No evidence for shoulder separation. No worrisome lytic or sclerotic osseous abnormality. Minimal degenerative changes noted in the region of  the rotator cuff insertion. IMPRESSION: No acute bony abnormality. Electronically Signed   By: Misty Stanley M.D.   On: 07/25/2019 08:39   UE VENOUS DUPLEX ([facility] & [facility] 7 am - 7 pm)  Result Date: 07/25/2019 UPPER VENOUS STUDY  Indications: Pain, and palpable lump on back of neck Comparison Study: No prior studies. Performing Technologist: Darlin Coco  Examination Guidelines: A complete evaluation includes B-mode imaging, spectral Doppler, color Doppler, and power Doppler as needed of all accessible portions of each vessel. Bilateral testing is considered an integral part of a complete examination. Limited examinations for reoccurring indications may be performed as noted.  Right Findings: +----------+------------+---------+-----------+----------+-------+ RIGHT     CompressiblePhasicitySpontaneousPropertiesSummary +----------+------------+---------+-----------+----------+-------+ IJV           Full       Yes       Yes                      +----------+------------+---------+-----------+----------+-------+ Subclavian    Full       Yes       Yes                      +----------+------------+---------+-----------+----------+-------+ Axillary      Full       Yes       Yes                      +----------+------------+---------+-----------+----------+-------+ Brachial       Full                                          +----------+------------+---------+-----------+----------+-------+ Radial        Full                                          +----------+------------+---------+-----------+----------+-------+ Ulnar         Full                                          +----------+------------+---------+-----------+----------+-------+ Cephalic      Full                                          +----------+------------+---------+-----------+----------+-------+ Basilic       Full                                          +----------+------------+---------+-----------+----------+-------+ Area of concern- nonvascular, palpable area on lateral aspect of the neck.  Left Findings: +----------+------------+---------+-----------+----------+-------+ LEFT      CompressiblePhasicitySpontaneousPropertiesSummary +----------+------------+---------+-----------+----------+-------+ Subclavian               Yes       Yes                      +----------+------------+---------+-----------+----------+-------+  Summary:  Right: No evidence of deep vein thrombosis in the upper extremity. No evidence of superficial vein thrombosis in the upper extremity.  Left: No evidence  of thrombosis in the subclavian.  *See table(s) above for measurements and observations.    Preliminary     Procedures Procedures (including critical care time)  Medications Ordered in ED Medications  ibuprofen (ADVIL) tablet 600 mg (600 mg Oral Given 07/25/19 7802)    ED Course  I have reviewed the triage vital signs and the nursing notes.  Pertinent labs & imaging results that were available during my care of the patient were reviewed by me and considered in my medical decision making (see chart for details).    MDM Rules/Calculators/A&P                          44 year old lady presenting to ER with concern for right neck pain, swelling.  Seen in urgent care, provider  concerned about possible supraclavicular lymphadenopathy and recommended patient come to ER for HIV testing, right upper extremity duplex rule out DVT.  On exam in ER today, patient is remarkably well-appearing, no palpable mass, no significant lymphadenopathy noted on my exam.  Katie Simmons has normal neck range of motion, no issues with p.o. intake.  Katie Simmons had reported feeling feverish, chills at night but is not having any night sweats.  Though Katie Simmons is from [place], Katie Simmons has no other risk factors or symptoms that would make me concerned about acute TB infection.  CXR, shoulder x-ray negative, DVT ultrasound negative.  Labs grossly normal.  Suspect MSK strain.  Given current assessment, work-up today, do not feel patient needs any additional testing or imaging.  Recommended that Katie Simmons have a recheck with a primary care doctor to monitor her symptoms.  I recommended that if Katie Simmons feels that her swelling worsens, as worsening in pain or other new concerning symptoms Katie Simmons come back to ER for reassessment. Believe Katie Simmons is appropriate for dc at this time and out pt management.     After the discussed management above, the patient was determined to be safe for discharge.  The patient was in agreement with this plan and all questions regarding their care were answered.  ED return precautions were discussed and the patient will return to the ED with any significant worsening of condition.  Final Clinical Impression(s) / ED Diagnoses Final diagnoses:  Neck pain    Rx / DC Orders ED Discharge Orders         Ordered    naproxen (NAPROSYN) 500 MG tablet  2 times daily PRN     Discontinue  Reprint     07/25/19 1043           Lucrezia Starch, MD 07/25/19 1136

## 2019-07-25 NOTE — Progress Notes (Signed)
RT Upper extremity venous study completed.   See Cv Proc for preliminary results.   Jean Rosenthal

## 2019-07-25 NOTE — ED Notes (Signed)
Venous scan negative for DVT

## 2021-01-26 ENCOUNTER — Ambulatory Visit (HOSPITAL_COMMUNITY)
Admission: EM | Admit: 2021-01-26 | Discharge: 2021-01-26 | Disposition: A | Discharge status: Home / Self Care | Payer: Self-pay | Attending: Family Medicine | Admitting: Family Medicine

## 2021-01-26 ENCOUNTER — Encounter (HOSPITAL_COMMUNITY): Payer: Self-pay | Admitting: Emergency Medicine

## 2021-01-26 ENCOUNTER — Other Ambulatory Visit: Payer: Self-pay

## 2021-01-26 DIAGNOSIS — M546 Pain in thoracic spine: Secondary | ICD-10-CM | POA: Insufficient documentation

## 2021-01-26 DIAGNOSIS — Z7984 Long term (current) use of oral hypoglycemic drugs: Secondary | ICD-10-CM

## 2021-01-26 DIAGNOSIS — N3090 Cystitis, unspecified without hematuria: Secondary | ICD-10-CM | POA: Insufficient documentation

## 2021-01-26 DIAGNOSIS — Z20822 Contact with and (suspected) exposure to covid-19: Secondary | ICD-10-CM | POA: Insufficient documentation

## 2021-01-26 DIAGNOSIS — M549 Dorsalgia, unspecified: Secondary | ICD-10-CM

## 2021-01-26 DIAGNOSIS — J069 Acute upper respiratory infection, unspecified: Secondary | ICD-10-CM | POA: Insufficient documentation

## 2021-01-26 DIAGNOSIS — E119 Type 2 diabetes mellitus without complications: Secondary | ICD-10-CM | POA: Insufficient documentation

## 2021-01-26 DIAGNOSIS — E1165 Type 2 diabetes mellitus with hyperglycemia: Secondary | ICD-10-CM

## 2021-01-26 DIAGNOSIS — R051 Acute cough: Secondary | ICD-10-CM | POA: Insufficient documentation

## 2021-01-26 LAB — POCT URINALYSIS DIPSTICK, ED / UC
Bilirubin Urine: NEGATIVE
Glucose, UA: >=1000 mg/dL — AB
Ketones, ur: 15 mg/dL — AB
Nitrite: NEGATIVE
Protein, ur: NEGATIVE mg/dL
Specific Gravity, Urine: <=1.005 (ref 1.005–1.030)
Urobilinogen, UA: 1 mg/dL (ref 0.0–1.0)
pH: 5 (ref 5.0–8.0)

## 2021-01-26 LAB — POC URINE PREG, ED: Preg Test, Ur: NEGATIVE

## 2021-01-26 LAB — POC INFLUENZA A AND B ANTIGEN (URGENT CARE ONLY)
INFLUENZA A ANTIGEN, POC: NEGATIVE
INFLUENZA B ANTIGEN, POC: NEGATIVE

## 2021-01-26 LAB — CBG MONITORING, ED: Glucose-Capillary: 417 mg/dL — ABNORMAL HIGH (ref 70–99)

## 2021-01-26 MED ORDER — METFORMIN HCL 500 MG PO TABS: 500.0000 mg | ORAL_TABLET | Freq: Two times a day (BID) | ORAL | 0 refills | Status: DC

## 2021-01-26 MED ORDER — NITROFURANTOIN MONOHYD MACRO 100 MG PO CAPS: 100.0000 mg | ORAL_CAPSULE | Freq: Two times a day (BID) | ORAL | 0 refills | Status: AC

## 2021-01-26 NOTE — ED Provider Notes (Addendum)
[facility]    CSN: [id number] Arrival date & time: 01/26/21  1552      History   Chief Complaint Chief Complaint  Patient presents with   Cough   Back Pain    HPI [facility] Katie Simmons is a 45 y.o. female.    Cough Back Pain Here for 1 week h/o upper back, neck and right sided head pain. Has had subj fever for 2 days. A little cough, too, and some left ear pain. No n/v/d. No dysuria.  Her LMP began 12/5, and then stopped, and then restarted.   Has DM, not on tx, due to no doctor.  Has had UTI previously.  Past Medical History:  Diagnosis Date   Diabetes mellitus without complication [facility])     Patient Active Problem List   Diagnosis Date Noted   E coli bacteremia 01/06/2018   Sepsis ([place]) 01/03/2018   Diabetes mellitus without complication ([place]) 34/74/2595   Pyelonephritis    Hypotension     History reviewed. No pertinent surgical history.  OB History   No obstetric history on file.      Home Medications    Prior to Admission medications   Medication Sig Start Date End Date Taking? Authorizing Provider  nitrofurantoin, macrocrystal-monohydrate, (MACROBID) 100 MG capsule Take 1 capsule (100 mg total) by mouth 2 (two) times daily for 7 days. 01/26/21 02/02/21 Yes Barrett Henle, MD  Blood Glucose Monitoring Suppl (TRUE METRIX METER) w/Device KIT 1 each by Does not apply route 3 (three) times daily. 06/28/15   Argentina Donovan, PA-C  cyclobenzaprine (FLEXERIL) 5 MG tablet Take 1 tablet (5 mg total) by mouth 2 (two) times daily as needed for muscle spasms. 10/20/17   Ward, Ozella Almond, PA-C  glucose blood (TRUE METRIX BLOOD GLUCOSE TEST) test strip Use as instructed 06/28/15   Argentina Donovan, PA-C  metFORMIN (GLUCOPHAGE) 500 MG tablet Take 1 tablet (500 mg total) by mouth 2 (two) times daily with a meal. 01/26/21   Iyan Flett, Gwenlyn Perking, MD  naproxen (NAPROSYN) 500 MG tablet Take 1 tablet (500 mg total) by mouth 2 (two) times daily as  needed. 07/25/19   Lucrezia Starch, MD  TRUEPLUS LANCETS 28G MISC 1 each by Does not apply route 3 (three) times daily. 06/28/15   Argentina Donovan, PA-C  glipiZIDE (GLIPIZIDE XL) 5 MG 24 hr tablet Take 1 tablet (5 mg total) by mouth daily with breakfast. Patient not taking: Reported on 01/03/2018 08/20/17 10/10/18  Elsie Stain, MD    Family History No family history on file.  Social History Social History   Tobacco Use   Smoking status: Never   Smokeless tobacco: Never  Substance Use Topics   Alcohol use: No    Alcohol/week: 0.0 standard drinks   Drug use: No     Allergies   Penicillins   Review of Systems Review of Systems  Respiratory:  Positive for cough.   Musculoskeletal:  Positive for back pain.    Physical Exam Triage Vital Signs ED Triage Vitals  Enc Vitals Group     BP 01/26/21 1652 118/86     Pulse Rate 01/26/21 1652 (!) 104     Resp 01/26/21 1652 17     Temp 01/26/21 1657 97.8 F (36.6 C)     Temp src --      SpO2 01/26/21 1652 98 %     Weight --      Height --  Head Circumference --      Peak Flow --      Pain Score 01/26/21 1653 10     Pain Loc --      Pain Edu? --      Excl. in [place]? --    No data found.  Updated Vital Signs BP 118/86 (BP Location: Right Arm)    Pulse (!) 104    Temp 97.8 F (36.6 C)    Resp 17    SpO2 98%   Visual Acuity Right Eye Distance:   Left Eye Distance:   Bilateral Distance:    Right Eye Near:   Left Eye Near:    Bilateral Near:     Physical Exam Vitals reviewed.  Constitutional:      General: She is not in acute distress.    Appearance: She is not toxic-appearing.  HENT:     Right Ear: Tympanic membrane and ear canal normal.     Left Ear: Tympanic membrane and ear canal normal.     Nose: Nose normal.     Mouth/Throat:     Mouth: Mucous membranes are moist.     Pharynx: No oropharyngeal exudate or posterior oropharyngeal erythema.  Eyes:     Extraocular Movements: Extraocular movements  intact.     Conjunctiva/sclera: Conjunctivae normal.     Pupils: Pupils are equal, round, and reactive to light.  Cardiovascular:     Rate and Rhythm: Normal rate and regular rhythm.     Heart sounds: No murmur heard. Pulmonary:     Effort: Pulmonary effort is normal.     Breath sounds: Normal breath sounds.  Abdominal:     Palpations: Abdomen is soft. There is no mass.     Tenderness: There is abdominal tenderness (mild generalized).  Musculoskeletal:     Cervical back: Neck supple.  Lymphadenopathy:     Cervical: No cervical adenopathy.  Skin:    Coloration: Skin is not jaundiced or pale.  Neurological:     Mental Status: She is alert and oriented to person, place, and time.  Psychiatric:        Behavior: Behavior normal.     UC Treatments / Results  Labs (all labs ordered are listed, but only abnormal results are displayed) Labs Reviewed  POCT URINALYSIS DIPSTICK, ED / UC - Abnormal; Notable for the following components:      Result Value   Glucose, UA >=1000 (*)    Ketones, ur 15 (*)    Hgb urine dipstick SMALL (*)    Leukocytes,Ua SMALL (*)    All other components within normal limits  CBG MONITORING, ED - Abnormal; Notable for the following components:   Glucose-Capillary 417 (*)    All other components within normal limits  SARS CORONAVIRUS 2 (TAT 6-24 HRS)  POC URINE PREG, ED  POC INFLUENZA A AND B ANTIGEN (URGENT CARE ONLY)    EKG   Radiology No results found.  Procedures Procedures (including critical care time)  Medications Ordered in UC Medications - No data to display  Initial Impression / Assessment and Plan / UC Course  I have reviewed the triage vital signs and the nursing notes.  Pertinent labs & imaging results that were available during my care of the patient were reviewed by me and considered in my medical decision making (see chart for details).     FS glucose is 417. UA/UPT to assess further. UPT negative. UA shows some WBC Flu  test negative. Will tx for poss  UTI, and do 30 day rx of metformin. Info will be given on finding a new doctor. COVID swab with her symptoms at dc.  Final Clinical Impressions(s) / UC Diagnoses   Final diagnoses:  Viral upper respiratory tract infection  Upper back pain  Cystitis  Type 2 diabetes mellitus without complication, without long-term current use of insulin ([place])     Discharge Instructions      Tome nitrofurantoin 2 veces al dia para infeccion de la [place]. La prueba de flu/gripa fue negativa.  Lucianne Lei a hacer la swab para covid. Vamos a hablarle si es positiva.  Necesita hacer cita para establecer con doctor general.     ED Prescriptions     Medication Sig Dispense Auth. Provider   metFORMIN (GLUCOPHAGE) 500 MG tablet Take 1 tablet (500 mg total) by mouth 2 (two) times daily with a meal. 60 tablet Katie Simmons, Gwenlyn Perking, MD   nitrofurantoin, macrocrystal-monohydrate, (MACROBID) 100 MG capsule Take 1 capsule (100 mg total) by mouth 2 (two) times daily for 7 days. 14 capsule Windy Carina, Gwenlyn Perking, MD      PDMP not reviewed this encounter.   Barrett Henle, MD 01/26/21 1755    Barrett Henle, MD 01/26/21 [phone]

## 2021-01-26 NOTE — ED Triage Notes (Signed)
Pt c/o head pain that radiates to back and lung area for week. Pt has cough.  Pt doesn't have a PCP so not taking any prescribed medications at this time.  Pt adds that she starting having menstrual cycle on 12/5 and still bleeding.

## 2021-01-26 NOTE — Discharge Instructions (Addendum)
Tome nitrofurantoin 2 veces al dia para infeccion de la [place]. La prueba de flu/gripa fue negativa.  Zenaida Niece a hacer la swab para covid. Vamos a hablarle si es positiva.  Necesita hacer cita para establecer con doctor general.

## 2021-01-27 LAB — SARS CORONAVIRUS 2 (TAT 6-24 HRS): SARS Coronavirus 2: NEGATIVE

## 2021-02-13 ENCOUNTER — Other Ambulatory Visit: Payer: Self-pay

## 2021-02-13 ENCOUNTER — Ambulatory Visit (HOSPITAL_COMMUNITY)
Admission: EM | Admit: 2021-02-13 | Discharge: 2021-02-13 | Disposition: A | Discharge status: Home / Self Care | Payer: Self-pay | Attending: Student | Admitting: Student

## 2021-02-13 ENCOUNTER — Encounter (HOSPITAL_COMMUNITY): Payer: Self-pay

## 2021-02-13 DIAGNOSIS — N3090 Cystitis, unspecified without hematuria: Secondary | ICD-10-CM | POA: Insufficient documentation

## 2021-02-13 DIAGNOSIS — Z789 Other specified health status: Secondary | ICD-10-CM | POA: Insufficient documentation

## 2021-02-13 LAB — POCT URINALYSIS DIPSTICK, ED / UC
Bilirubin Urine: NEGATIVE
Glucose, UA: >=1000 mg/dL — AB
Ketones, ur: NEGATIVE mg/dL
Nitrite: NEGATIVE
Protein, ur: NEGATIVE mg/dL
Specific Gravity, Urine: 1.02 (ref 1.005–1.030)
Urobilinogen, UA: 1 mg/dL (ref 0.0–1.0)
pH: 6.5 (ref 5.0–8.0)

## 2021-02-13 LAB — POC URINE PREG, ED: Preg Test, Ur: NEGATIVE

## 2021-02-13 MED ORDER — CIPROFLOXACIN HCL 500 MG PO TABS: 500.0000 mg | ORAL_TABLET | Freq: Two times a day (BID) | ORAL | 0 refills | Status: DC

## 2021-02-13 NOTE — Discharge Instructions (Addendum)
-  Cipro twice a day x5 days -Drink plenty of water -ED if symptoms worsen

## 2021-02-13 NOTE — ED Provider Notes (Signed)
[place]    CSN: [id number] Arrival date & time: 02/13/21  1842      History   Chief Complaint Chief Complaint  Patient presents with   Fever   Headache   Abdominal Pain    HPI Katie Simmons is a 46 y.o. female presenting with fevers and abdominal pain for 20 days. Medical history pyelo, sepsis. Generalized abd pain, worse at night. Urinary symptoms - dysuria, suprapubic pressure, R flank pain. Denies hematuria, feveres/chills.  HPI  Past Medical History:  Diagnosis Date   Diabetes mellitus without complication [facility])     Patient Active Problem List   Diagnosis Date Noted   E coli bacteremia 01/06/2018   Sepsis ([place]) 01/03/2018   Diabetes mellitus without complication ([place]) 97/98/9211   Pyelonephritis    Hypotension     History reviewed. No pertinent surgical history.  OB History   No obstetric history on file.      Home Medications    Prior to Admission medications   Medication Sig Start Date End Date Taking? Authorizing Provider  ciprofloxacin (CIPRO) 500 MG tablet Take 1 tablet (500 mg total) by mouth every 12 (twelve) hours. 02/13/21  Yes Hazel Sams, PA-C  Blood Glucose Monitoring Suppl (TRUE METRIX METER) w/Device KIT 1 each by Does not apply route 3 (three) times daily. 06/28/15   Argentina Donovan, PA-C  cyclobenzaprine (FLEXERIL) 5 MG tablet Take 1 tablet (5 mg total) by mouth 2 (two) times daily as needed for muscle spasms. 10/20/17   Ward, Ozella Almond, PA-C  glucose blood (TRUE METRIX BLOOD GLUCOSE TEST) test strip Use as instructed 06/28/15   Argentina Donovan, PA-C  metFORMIN (GLUCOPHAGE) 500 MG tablet Take 1 tablet (500 mg total) by mouth 2 (two) times daily with a meal. 01/26/21   Banister, Gwenlyn Perking, MD  naproxen (NAPROSYN) 500 MG tablet Take 1 tablet (500 mg total) by mouth 2 (two) times daily as needed. 07/25/19   Lucrezia Starch, MD  TRUEPLUS LANCETS 28G MISC 1 each by Does not apply route 3 (three) times daily.  06/28/15   Argentina Donovan, PA-C  glipiZIDE (GLIPIZIDE XL) 5 MG 24 hr tablet Take 1 tablet (5 mg total) by mouth daily with breakfast. Patient not taking: Reported on 01/03/2018 08/20/17 10/10/18  Elsie Stain, MD    Family History History reviewed. No pertinent family history.  Social History Social History   Tobacco Use   Smoking status: Never   Smokeless tobacco: Never  Substance Use Topics   Alcohol use: No    Alcohol/week: 0.0 standard drinks   Drug use: No     Allergies   Penicillins   Review of Systems Review of Systems  Constitutional:  Negative for appetite change, chills, diaphoresis and fever.  Respiratory:  Negative for shortness of breath.   Cardiovascular:  Negative for chest pain.  Gastrointestinal:  Positive for abdominal pain. Negative for blood in stool, constipation, diarrhea, nausea and vomiting.  Genitourinary:  Positive for dysuria and flank pain. Negative for decreased urine volume, difficulty urinating, frequency, genital sores, hematuria and urgency.  Musculoskeletal:  Negative for back pain.  Neurological:  Negative for dizziness, weakness and light-headedness.  All other systems reviewed and are negative.   Physical Exam Triage Vital Signs ED Triage Vitals  Enc Vitals Group     BP 02/13/21 1936 126/90     Pulse Rate 02/13/21 1936 99     Resp 02/13/21 1936 17  Temp 02/13/21 1936 98.4 F (36.9 C)     Temp Source 02/13/21 1936 Oral     SpO2 02/13/21 1936 100 %     Weight --      Height --      Head Circumference --      Peak Flow --      Pain Score 02/13/21 1935 10     Pain Loc --      Pain Edu? --      Excl. in [place]? --    No data found.  Updated Vital Signs BP 126/90 (BP Location: Right Arm)    Pulse 99    Temp 98.4 F (36.9 C) (Oral)    Resp 17    LMP 01/13/2021 (Exact Date)    SpO2 100%   Visual Acuity Right Eye Distance:   Left Eye Distance:   Bilateral Distance:    Right Eye Near:   Left Eye Near:    Bilateral  Near:     Physical Exam Vitals reviewed.  Constitutional:      General: She is not in acute distress.    Appearance: Normal appearance. She is not ill-appearing.  HENT:     Head: Normocephalic and atraumatic.     Mouth/Throat:     Mouth: Mucous membranes are moist.     Comments: Moist mucous membranes Eyes:     Extraocular Movements: Extraocular movements intact.     Pupils: Pupils are equal, round, and reactive to light.  Cardiovascular:     Rate and Rhythm: Normal rate and regular rhythm.     Heart sounds: Normal heart sounds.  Pulmonary:     Effort: Pulmonary effort is normal.     Breath sounds: Normal breath sounds. No wheezing, rhonchi or rales.  Abdominal:     General: Bowel sounds are normal. There is no distension.     Palpations: Abdomen is soft. There is no mass.     Tenderness: There is abdominal tenderness in the suprapubic area. There is right CVA tenderness. There is no left CVA tenderness, guarding or rebound. Negative signs include Murphy's sign, Rovsing's sign and McBurney's sign.  Skin:    General: Skin is warm.     Capillary Refill: Capillary refill takes less than 2 seconds.     Comments: Good skin turgor  Neurological:     General: No focal deficit present.     Mental Status: She is alert and oriented to person, place, and time.  Psychiatric:        Mood and Affect: Mood normal.        Behavior: Behavior normal.     UC Treatments / Results  Labs (all labs ordered are listed, but only abnormal results are displayed) Labs Reviewed  URINE CULTURE  POCT URINALYSIS DIPSTICK, ED / UC  POC URINE PREG, ED    EKG   Radiology No results found.  Procedures Procedures (including critical care time)  Medications Ordered in UC Medications - No data to display  Initial Impression / Assessment and Plan / UC Course  I have reviewed the triage vital signs and the nursing notes.  Pertinent labs & imaging results that were available during my care of the  patient were reviewed by me and considered in my medical decision making (see chart for details).     This patient is a very pleasant 46 y.o. year old female presenting with concern for pyelo. History of the same. Afebrile, nontachy. R CVAT.  I do have concern  for Pyelo.  UA with blood and leuk. Urine culture sent. U-preg negative.   Penicillin allergic and states she has other allergies to but she cannot recall what these are.  Ciprofloxacin sent as she has tolerated this in the past  ED return precautions discussed. Patient verbalizes understanding and agreement.   Coding Level 4 for acute illness with systemic symptoms, and prescription drug management  Spoke with this patient using language line - [phone]  Final Clinical Impressions(s) / UC Diagnoses   Final diagnoses:  Cystitis     Discharge Instructions      -Cipro twice a day x5 days -Drink plenty of water -ED if symptoms worsen     ED Prescriptions     Medication Sig Dispense Auth. Provider   ciprofloxacin (CIPRO) 500 MG tablet Take 1 tablet (500 mg total) by mouth every 12 (twelve) hours. 10 tablet Hazel Sams, PA-C      PDMP not reviewed this encounter.   Hazel Sams, PA-C 02/13/21 2013

## 2021-02-13 NOTE — ED Triage Notes (Signed)
Pt presents with c/o fever and stomach pain x 3 days.   States she has hip pain.

## 2021-02-16 LAB — URINE CULTURE: Culture: 40000 — AB

## 2021-02-18 ENCOUNTER — Encounter (HOSPITAL_COMMUNITY): Payer: Self-pay

## 2021-02-18 ENCOUNTER — Other Ambulatory Visit: Payer: Self-pay

## 2021-02-18 ENCOUNTER — Inpatient Hospital Stay (HOSPITAL_COMMUNITY)
Admission: EM | Admit: 2021-02-18 | Discharge: 2021-02-23 | DRG: 872 | Disposition: A | Discharge status: Home / Self Care | Payer: Self-pay | Attending: Internal Medicine | Admitting: Internal Medicine

## 2021-02-18 DIAGNOSIS — I10 Essential (primary) hypertension: Secondary | ICD-10-CM | POA: Diagnosis present

## 2021-02-18 DIAGNOSIS — K8020 Calculus of gallbladder without cholecystitis without obstruction: Secondary | ICD-10-CM | POA: Diagnosis present

## 2021-02-18 DIAGNOSIS — E1165 Type 2 diabetes mellitus with hyperglycemia: Secondary | ICD-10-CM | POA: Diagnosis present

## 2021-02-18 DIAGNOSIS — Z79899 Other long term (current) drug therapy: Secondary | ICD-10-CM

## 2021-02-18 DIAGNOSIS — N12 Tubulo-interstitial nephritis, not specified as acute or chronic: Secondary | ICD-10-CM | POA: Diagnosis present

## 2021-02-18 DIAGNOSIS — N179 Acute kidney failure, unspecified: Secondary | ICD-10-CM | POA: Diagnosis present

## 2021-02-18 DIAGNOSIS — Z20822 Contact with and (suspected) exposure to covid-19: Secondary | ICD-10-CM | POA: Diagnosis present

## 2021-02-18 DIAGNOSIS — R112 Nausea with vomiting, unspecified: Secondary | ICD-10-CM | POA: Diagnosis present

## 2021-02-18 DIAGNOSIS — R7401 Elevation of levels of liver transaminase levels: Secondary | ICD-10-CM | POA: Diagnosis present

## 2021-02-18 DIAGNOSIS — Z7984 Long term (current) use of oral hypoglycemic drugs: Secondary | ICD-10-CM

## 2021-02-18 DIAGNOSIS — N10 Acute tubulo-interstitial nephritis: Secondary | ICD-10-CM | POA: Diagnosis present

## 2021-02-18 DIAGNOSIS — E876 Hypokalemia: Secondary | ICD-10-CM | POA: Diagnosis present

## 2021-02-18 DIAGNOSIS — R197 Diarrhea, unspecified: Secondary | ICD-10-CM | POA: Diagnosis present

## 2021-02-18 DIAGNOSIS — A419 Sepsis, unspecified organism: Principal | ICD-10-CM | POA: Diagnosis present

## 2021-02-18 DIAGNOSIS — E871 Hypo-osmolality and hyponatremia: Secondary | ICD-10-CM | POA: Diagnosis present

## 2021-02-18 DIAGNOSIS — E119 Type 2 diabetes mellitus without complications: Secondary | ICD-10-CM

## 2021-02-18 DIAGNOSIS — Z880 Allergy status to penicillin: Secondary | ICD-10-CM

## 2021-02-18 MED ORDER — MORPHINE SULFATE (PF) 2 MG/ML IV SOLN
4.0000 mg | Freq: Once | INTRAVENOUS | Status: AC
  Administered 2021-02-18: 4 mg via INTRAVENOUS
  Filled 2021-02-18: qty 2

## 2021-02-18 MED ORDER — ONDANSETRON HCL 4 MG/2ML IJ SOLN
4.0000 mg | Freq: Once | INTRAMUSCULAR | Status: AC
  Administered 2021-02-18: 4 mg via INTRAVENOUS
  Filled 2021-02-18: qty 2

## 2021-02-18 NOTE — ED Provider Triage Note (Signed)
Emergency Medicine Provider Triage Evaluation Note  [facility] Katie Simmons , a 46 y.o. female  was evaluated in triage.  Pt complains of abdominal pain that started around 3 PM today.  States it is worsening.  States it is in the upper abdomen and also radiates into her chest.  No shortness of breath.  Reports associated nausea, vomiting, and diarrhea.  Patient unsure of hematemesis or hematochezia.  Denies any urinary complaints.  Denies a surgical history to her abdomen.  History obtained via Spanish interpreter.  Physical Exam  BP 98/71 (BP Location: Left Arm)    Pulse (!) 118    Temp 99.4 F (37.4 C) (Oral)    Resp (!) 28    Ht 4\' 8"  (1.422 m)    Wt 52 kg    SpO2 96%    BMI 25.70 kg/m  Gen:   Awake, no distress   Resp:  Normal effort  MSK:   Moves extremities without difficulty  Other:  Abdomen is flat and soft.  Mild to moderate tenderness noted along the epigastrium, right upper quadrant, as well as the right lateral abdomen.  Bilateral CVA tenderness.  Medical Decision Making  Medically screening exam initiated at 11:27 PM.  Appropriate orders placed.  [facility] was informed that the remainder of the evaluation will be completed by another provider, this initial triage assessment does not replace that evaluation, and the importance of remaining in the ED until their evaluation is complete.   Katie Lo, PA-C 02/18/21 2328

## 2021-02-18 NOTE — ED Triage Notes (Signed)
Pt arrives POV for eval of abd pain and fever since 1500. Pt reports she feels as though something is going to burst. Pt reports vomiting and diarrhea.

## 2021-02-19 ENCOUNTER — Emergency Department (HOSPITAL_COMMUNITY): Payer: Self-pay

## 2021-02-19 ENCOUNTER — Other Ambulatory Visit: Payer: Self-pay

## 2021-02-19 DIAGNOSIS — K8020 Calculus of gallbladder without cholecystitis without obstruction: Secondary | ICD-10-CM | POA: Diagnosis present

## 2021-02-19 DIAGNOSIS — N12 Tubulo-interstitial nephritis, not specified as acute or chronic: Secondary | ICD-10-CM

## 2021-02-19 DIAGNOSIS — E876 Hypokalemia: Secondary | ICD-10-CM | POA: Diagnosis present

## 2021-02-19 DIAGNOSIS — K8050 Calculus of bile duct without cholangitis or cholecystitis without obstruction: Secondary | ICD-10-CM

## 2021-02-19 DIAGNOSIS — E871 Hypo-osmolality and hyponatremia: Secondary | ICD-10-CM

## 2021-02-19 DIAGNOSIS — R7401 Elevation of levels of liver transaminase levels: Secondary | ICD-10-CM

## 2021-02-19 DIAGNOSIS — E119 Type 2 diabetes mellitus without complications: Secondary | ICD-10-CM

## 2021-02-19 DIAGNOSIS — R112 Nausea with vomiting, unspecified: Secondary | ICD-10-CM

## 2021-02-19 DIAGNOSIS — N179 Acute kidney failure, unspecified: Secondary | ICD-10-CM

## 2021-02-19 DIAGNOSIS — R197 Diarrhea, unspecified: Secondary | ICD-10-CM

## 2021-02-19 DIAGNOSIS — R6520 Severe sepsis without septic shock: Secondary | ICD-10-CM

## 2021-02-19 DIAGNOSIS — A4151 Sepsis due to Escherichia coli [E. coli]: Secondary | ICD-10-CM

## 2021-02-19 LAB — URINALYSIS, ROUTINE W REFLEX MICROSCOPIC
Bilirubin Urine: NEGATIVE
Glucose, UA: >=500 mg/dL — AB
Ketones, ur: 15 mg/dL — AB
Leukocytes,Ua: NEGATIVE
Nitrite: NEGATIVE
Protein, ur: 30 mg/dL — AB
Specific Gravity, Urine: <1.005 — ABNORMAL LOW (ref 1.005–1.030)
pH: 5.5 (ref 5.0–8.0)

## 2021-02-19 LAB — LACTIC ACID, PLASMA
Lactic Acid, Venous: 1.6 mmol/L (ref 0.5–1.9)
Lactic Acid, Venous: 1.3 mmol/L (ref 0.5–1.9)

## 2021-02-19 LAB — COMPREHENSIVE METABOLIC PANEL
ALT: 46 U/L — ABNORMAL HIGH (ref 0–44)
AST: 117 U/L — ABNORMAL HIGH (ref 15–41)
Albumin: 2.8 g/dL — ABNORMAL LOW (ref 3.5–5.0)
Alkaline Phosphatase: 146 U/L — ABNORMAL HIGH (ref 38–126)
Anion gap: 11 (ref 5–15)
BUN: 21 mg/dL — ABNORMAL HIGH (ref 6–20)
CO2: 19 mmol/L — ABNORMAL LOW (ref 22–32)
Calcium: 7.7 mg/dL — ABNORMAL LOW (ref 8.9–10.3)
Chloride: 96 mmol/L — ABNORMAL LOW (ref 98–111)
Creatinine, Ser: 1.08 mg/dL — ABNORMAL HIGH (ref 0.44–1.00)
GFR, Estimated: >60 mL/min (ref >60)
Glucose, Bld: 342 mg/dL — ABNORMAL HIGH (ref 70–99)
Potassium: 3.2 mmol/L — ABNORMAL LOW (ref 3.5–5.1)
Sodium: 126 mmol/L — ABNORMAL LOW (ref 135–145)
Total Bilirubin: 0.9 mg/dL (ref 0.3–1.2)
Total Protein: 6.7 g/dL (ref 6.5–8.1)

## 2021-02-19 LAB — HEPATITIS PANEL, ACUTE
HCV Ab: NONREACTIVE
Hep A IgM: NONREACTIVE
Hep B C IgM: NONREACTIVE
Hepatitis B Surface Ag: NONREACTIVE

## 2021-02-19 LAB — CBC WITH DIFFERENTIAL/PLATELET
Abs Immature Granulocytes: 0.22 10*3/uL — ABNORMAL HIGH (ref 0.00–0.07)
Basophils Absolute: 0 10*3/uL (ref 0.0–0.1)
Basophils Relative: 0 %
Eosinophils Absolute: 0 10*3/uL (ref 0.0–0.5)
Eosinophils Relative: 0 %
HCT: 38.1 % (ref 36.0–46.0)
Hemoglobin: 13.2 g/dL (ref 12.0–15.0)
Immature Granulocytes: 1 %
Lymphocytes Relative: 4 %
Lymphs Abs: 0.7 10*3/uL (ref 0.7–4.0)
MCH: 30 pg (ref 26.0–34.0)
MCHC: 34.6 g/dL (ref 30.0–36.0)
MCV: 86.6 fL (ref 80.0–100.0)
Monocytes Absolute: 0.5 10*3/uL (ref 0.1–1.0)
Monocytes Relative: 3 %
Neutro Abs: 16.8 10*3/uL — ABNORMAL HIGH (ref 1.7–7.7)
Neutrophils Relative %: 92 %
Platelets: 250 10*3/uL (ref 150–400)
RBC: 4.4 MIL/uL (ref 3.87–5.11)
RDW: 12.5 % (ref 11.5–15.5)
WBC: 18.3 10*3/uL — ABNORMAL HIGH (ref 4.0–10.5)
nRBC: 0 % (ref 0.0–0.2)

## 2021-02-19 LAB — HEMOGLOBIN A1C
Hgb A1c MFr Bld: 14.7 % — ABNORMAL HIGH (ref 4.8–5.6)
Mean Plasma Glucose: 375.19 mg/dL

## 2021-02-19 LAB — CBG MONITORING, ED: Glucose-Capillary: 378 mg/dL — ABNORMAL HIGH (ref 70–99)

## 2021-02-19 LAB — I-STAT BETA HCG BLOOD, ED (MC, WL, AP ONLY): I-stat hCG, quantitative: <5 m[IU]/mL (ref <5)

## 2021-02-19 LAB — GLUCOSE, CAPILLARY
Glucose-Capillary: 250 mg/dL — ABNORMAL HIGH (ref 70–99)
Glucose-Capillary: 249 mg/dL — ABNORMAL HIGH (ref 70–99)

## 2021-02-19 LAB — URINALYSIS, MICROSCOPIC (REFLEX)

## 2021-02-19 LAB — RESP PANEL BY RT-PCR (FLU A&B, COVID) ARPGX2
Influenza A by PCR: NEGATIVE
Influenza B by PCR: NEGATIVE
SARS Coronavirus 2 by RT PCR: NEGATIVE

## 2021-02-19 LAB — LIPASE, BLOOD: Lipase: 27 U/L (ref 11–51)

## 2021-02-19 LAB — TROPONIN I (HIGH SENSITIVITY): Troponin I (High Sensitivity): 16 ng/L (ref <18)

## 2021-02-19 LAB — MAGNESIUM: Magnesium: 1.8 mg/dL (ref 1.7–2.4)

## 2021-02-19 MED ORDER — ONDANSETRON HCL 4 MG/2ML IJ SOLN
4.0000 mg | Freq: Four times a day (QID) | INTRAMUSCULAR | Status: DC | PRN
  Administered 2021-02-19: 4 mg via INTRAVENOUS
  Filled 2021-02-19: qty 2

## 2021-02-19 MED ORDER — ACETAMINOPHEN 325 MG PO TABS
650.0000 mg | ORAL_TABLET | Freq: Four times a day (QID) | ORAL | Status: DC | PRN
  Administered 2021-02-20 – 2021-02-21 (×2): 650 mg via ORAL
  Filled 2021-02-19 (×2): qty 2

## 2021-02-19 MED ORDER — SODIUM CHLORIDE 0.9 % IV SOLN
2.0000 g | Freq: Two times a day (BID) | INTRAVENOUS | Status: DC
  Administered 2021-02-19 – 2021-02-21 (×4): 2 g via INTRAVENOUS
  Filled 2021-02-19 (×4): qty 2

## 2021-02-19 MED ORDER — ENOXAPARIN SODIUM 40 MG/0.4ML IJ SOSY
40.0000 mg | PREFILLED_SYRINGE | INTRAMUSCULAR | Status: DC
  Administered 2021-02-20 – 2021-02-23 (×4): 40 mg via SUBCUTANEOUS
  Filled 2021-02-19 (×4): qty 0.4

## 2021-02-19 MED ORDER — POTASSIUM CHLORIDE IN NACL 20-0.9 MEQ/L-% IV SOLN
INTRAVENOUS | Status: DC
  Filled 2021-02-19 (×8): qty 1000

## 2021-02-19 MED ORDER — MORPHINE SULFATE (PF) 4 MG/ML IV SOLN
4.0000 mg | Freq: Once | INTRAVENOUS | Status: AC
  Administered 2021-02-19: 4 mg via INTRAVENOUS
  Filled 2021-02-19: qty 1

## 2021-02-19 MED ORDER — HYDROCODONE-ACETAMINOPHEN 5-325 MG PO TABS
1.0000 | ORAL_TABLET | Freq: Four times a day (QID) | ORAL | Status: DC | PRN
  Administered 2021-02-20 – 2021-02-23 (×5): 1 via ORAL
  Filled 2021-02-19 (×5): qty 1

## 2021-02-19 MED ORDER — MORPHINE SULFATE (PF) 2 MG/ML IV SOLN
2.0000 mg | Freq: Once | INTRAVENOUS | Status: AC
  Administered 2021-02-19: 2 mg via INTRAVENOUS
  Filled 2021-02-19: qty 1

## 2021-02-19 MED ORDER — PROCHLORPERAZINE EDISYLATE 10 MG/2ML IJ SOLN
10.0000 mg | Freq: Four times a day (QID) | INTRAMUSCULAR | Status: DC | PRN
  Administered 2021-02-19: 10 mg via INTRAVENOUS
  Filled 2021-02-19: qty 2

## 2021-02-19 MED ORDER — ONDANSETRON HCL 4 MG/2ML IJ SOLN
4.0000 mg | Freq: Once | INTRAMUSCULAR | Status: AC
Start: 2021-02-19 — End: 2021-02-19
  Administered 2021-02-19: 4 mg via INTRAVENOUS

## 2021-02-19 MED ORDER — ALBUTEROL SULFATE (2.5 MG/3ML) 0.083% IN NEBU: 2.5000 mg | INHALATION_SOLUTION | Freq: Four times a day (QID) | RESPIRATORY_TRACT | Status: DC | PRN

## 2021-02-19 MED ORDER — METRONIDAZOLE 500 MG/100ML IV SOLN
500.0000 mg | Freq: Two times a day (BID) | INTRAVENOUS | Status: DC
  Administered 2021-02-19: 500 mg via INTRAVENOUS
  Filled 2021-02-19: qty 100

## 2021-02-19 MED ORDER — IOHEXOL 300 MG/ML  SOLN
80.0000 mL | Freq: Once | INTRAMUSCULAR | Status: AC | PRN
  Administered 2021-02-19: 80 mL via INTRAVENOUS

## 2021-02-19 MED ORDER — SODIUM CHLORIDE 0.9 % IV SOLN
1.0000 g | Freq: Once | INTRAVENOUS | Status: AC
  Administered 2021-02-19: 1 g via INTRAVENOUS
  Filled 2021-02-19: qty 1

## 2021-02-19 MED ORDER — ONDANSETRON HCL 4 MG PO TABS
4.0000 mg | ORAL_TABLET | Freq: Four times a day (QID) | ORAL | Status: DC | PRN
  Administered 2021-02-20 – 2021-02-22 (×2): 4 mg via ORAL
  Filled 2021-02-19 (×2): qty 1

## 2021-02-19 MED ORDER — INSULIN ASPART 100 UNIT/ML IJ SOLN
0.0000 [IU] | Freq: Three times a day (TID) | INTRAMUSCULAR | Status: DC
  Administered 2021-02-19: 15 [IU] via SUBCUTANEOUS
  Administered 2021-02-19 – 2021-02-20 (×2): 5 [IU] via SUBCUTANEOUS
  Administered 2021-02-20: 8 [IU] via SUBCUTANEOUS
  Administered 2021-02-20: 11 [IU] via SUBCUTANEOUS
  Administered 2021-02-21: 3 [IU] via SUBCUTANEOUS
  Administered 2021-02-21: 11 [IU] via SUBCUTANEOUS
  Administered 2021-02-21: 8 [IU] via SUBCUTANEOUS
  Administered 2021-02-22 – 2021-02-23 (×4): 5 [IU] via SUBCUTANEOUS

## 2021-02-19 MED ORDER — SODIUM CHLORIDE 0.9 % IV BOLUS
1000.0000 mL | Freq: Once | INTRAVENOUS | Status: AC
Start: 2021-02-19 — End: 2021-02-19
  Administered 2021-02-19: 1000 mL via INTRAVENOUS

## 2021-02-19 MED ORDER — ACETAMINOPHEN 650 MG RE SUPP: 650.0000 mg | Freq: Four times a day (QID) | RECTAL | Status: DC | PRN

## 2021-02-19 MED ORDER — ONDANSETRON HCL 4 MG/2ML IJ SOLN
INTRAMUSCULAR | Status: AC
  Filled 2021-02-19: qty 2

## 2021-02-19 MED ORDER — CALCIUM GLUCONATE-NACL 1-0.675 GM/50ML-% IV SOLN
1.0000 g | Freq: Once | INTRAVENOUS | Status: AC
  Administered 2021-02-19: 1000 mg via INTRAVENOUS
  Filled 2021-02-19: qty 50

## 2021-02-19 MED ORDER — ONDANSETRON HCL 4 MG/2ML IJ SOLN
4.0000 mg | Freq: Once | INTRAMUSCULAR | Status: AC
  Administered 2021-02-19: 4 mg via INTRAVENOUS
  Filled 2021-02-19: qty 2

## 2021-02-19 NOTE — Progress Notes (Signed)
Video interpreter used. Ms. Katie Simmons is alert and oriented x4. On room air, breathing is even and unlabored. Assessed skin with Ginnie Smart. Telemetry completed. Oriented to room, call light, and bed controls. Bed in lowest position.

## 2021-02-19 NOTE — ED Notes (Signed)
Pt started vomiting at this time. Zofran IVP given. Will continue to monitor.  

## 2021-02-19 NOTE — ED Provider Notes (Signed)
[facility] EMERGENCY DEPARTMENT Provider Note   CSN: [id number] Arrival date & time: 02/18/21  2311     History  Chief Complaint  Patient presents with   Abdominal Pain    Katie Simmons Orbie Hurst Judge Stall is a 46 y.o. female.  HPI     This is a 46 year old female with a history of diabetes who presents with right upper quadrant pain, fever.  Patient reports 3 to 4 days of worsening fevers and right upper quadrant, right flank pain.  She was seen and evaluated at urgent care 5 days ago and diagnosed with UTI.  She has since been on ciprofloxacin.  She reports "high fevers" at home but cannot tell me what her temperature has been.  Additionally, she reports nausea and diarrhea.  No known sick contacts.  She does report some dysuria.  She states that her pain is 10 out of 10.  It is worse with eating food.  Chart reviewed.  Seen in urgent care.  Urine culture grew out 40,000 E. coli susceptible to Cipro  Home Medications Prior to Admission medications   Medication Sig Start Date End Date Taking? Authorizing Provider  acetaminophen (TYLENOL) 500 MG tablet Take 500 mg by mouth every 6 (six) hours as needed for moderate pain or headache.   Yes [provider]  ciprofloxacin (CIPRO) 500 MG tablet Take 1 tablet (500 mg total) by mouth every 12 (twelve) hours. Patient taking differently: Take 500 mg by mouth See admin instructions. Bid x days 02/13/21  Yes Hazel Sams, PA-C  ibuprofen (ADVIL) 200 MG tablet Take 200 mg by mouth every 6 (six) hours as needed for headache or moderate pain.   Yes [provider]  metFORMIN (GLUCOPHAGE) 500 MG tablet Take 1 tablet (500 mg total) by mouth 2 (two) times daily with a meal. 01/26/21  Yes Banister, Gwenlyn Perking, MD  Blood Glucose Monitoring Suppl (TRUE METRIX METER) w/Device KIT 1 each by Does not apply route 3 (three) times daily. Patient not taking: Reported on 02/19/2021 06/28/15   Argentina Donovan, PA-C   cyclobenzaprine (FLEXERIL) 5 MG tablet Take 1 tablet (5 mg total) by mouth 2 (two) times daily as needed for muscle spasms. Patient not taking: Reported on 02/19/2021 10/20/17   Ward, Ozella Almond, PA-C  glucose blood (TRUE METRIX BLOOD GLUCOSE TEST) test strip Use as instructed Patient not taking: Reported on 02/19/2021 06/28/15   Argentina Donovan, PA-C  naproxen (NAPROSYN) 500 MG tablet Take 1 tablet (500 mg total) by mouth 2 (two) times daily as needed. Patient not taking: Reported on 02/19/2021 07/25/19   Lucrezia Starch, MD  TRUEPLUS LANCETS 28G MISC 1 each by Does not apply route 3 (three) times daily. Patient not taking: Reported on 02/19/2021 06/28/15   Argentina Donovan, PA-C  glipiZIDE (GLIPIZIDE XL) 5 MG 24 hr tablet Take 1 tablet (5 mg total) by mouth daily with breakfast. Patient not taking: Reported on 01/03/2018 08/20/17 10/10/18  Elsie Stain, MD      Allergies    Penicillins    Review of Systems   Review of Systems  Constitutional:  Positive for fever.  Gastrointestinal:  Positive for abdominal pain, diarrhea, nausea and vomiting.  Genitourinary:  Positive for dysuria and flank pain.  All other systems reviewed and are negative.  Physical Exam Updated Vital Signs BP 120/71    Pulse 93    Temp 98 F (36.7 C) (Oral)    Resp 18  Ht 1.422 m (_0 )    Wt 52 kg    SpO2 97%    BMI 25.70 kg/m  Physical Exam Vitals and nursing note reviewed.  Constitutional:      Appearance: She is well-developed. She is ill-appearing. She is not toxic-appearing.  HENT:     Head: Normocephalic and atraumatic.  Eyes:     Pupils: Pupils are equal, round, and reactive to light.  Cardiovascular:     Rate and Rhythm: Regular rhythm. Tachycardia present.     Heart sounds: Normal heart sounds.  Pulmonary:     Effort: Pulmonary effort is normal. No respiratory distress.     Breath sounds: No wheezing.  Abdominal:     General: Bowel sounds are normal.     Palpations: Abdomen is soft.      Tenderness: There is abdominal tenderness in the right upper quadrant. There is rebound. There is no right CVA tenderness or guarding. Positive signs include Murphy's sign.  Musculoskeletal:     Cervical back: Neck supple.  Skin:    General: Skin is warm and dry.  Neurological:     Mental Status: She is alert and oriented to person, place, and time.  Psychiatric:        Mood and Affect: Mood normal.    ED Results / Procedures / Treatments   Labs (all labs ordered are listed, but only abnormal results are displayed) Labs Reviewed  COMPREHENSIVE METABOLIC PANEL - Abnormal; Notable for the following components:      Result Value   Sodium 126 (*)    Potassium 3.2 (*)    Chloride 96 (*)    CO2 19 (*)    Glucose, Bld 342 (*)    BUN 21 (*)    Creatinine, Ser 1.08 (*)    Calcium 7.7 (*)    Albumin 2.8 (*)    AST 117 (*)    ALT 46 (*)    Alkaline Phosphatase 146 (*)    All other components within normal limits  CBC WITH DIFFERENTIAL/PLATELET - Abnormal; Notable for the following components:   WBC 18.3 (*)    Neutro Abs 16.8 (*)    Abs Immature Granulocytes 0.22 (*)    All other components within normal limits  URINALYSIS, ROUTINE W REFLEX MICROSCOPIC - Abnormal; Notable for the following components:   APPearance CLOUDY (*)    Specific Gravity, Urine <1.005 (*)    Glucose, UA >=500 (*)    Hgb urine dipstick MODERATE (*)    Ketones, ur 15 (*)    Protein, ur 30 (*)    All other components within normal limits  URINALYSIS, MICROSCOPIC (REFLEX) - Abnormal; Notable for the following components:   Bacteria, UA RARE (*)    All other components within normal limits  URINE CULTURE  LIPASE, BLOOD  LACTIC ACID, PLASMA  LACTIC ACID, PLASMA  I-STAT BETA HCG BLOOD, ED ([facility], [facility], [facility] ONLY)  TROPONIN I (HIGH SENSITIVITY)    EKG EKG Interpretation  Date/Time:  Wednesday February 19 2021 00:21:22 EST Ventricular Rate:  106 PR Interval:  148 QRS Duration: 58 QT Interval:  352 QTC  Calculation: 467 R Axis:   74 Text Interpretation: Sinus tachycardia Cannot rule out Anterior infarct , age undetermined Abnormal ECG When compared with ECG of 03-Jan-2018 10:45, PREVIOUS ECG IS PRESENT Confirmed by Thayer Jew [phone]) on 02/19/2021 3:08:56 AM  Radiology CT ABDOMEN PELVIS W CONTRAST  Result Date: 02/19/2021 CLINICAL DATA:  Abdominal pain. EXAM: CT ABDOMEN AND PELVIS WITH  CONTRAST TECHNIQUE: Multidetector CT imaging of the abdomen and pelvis was performed using the standard protocol following bolus administration of intravenous contrast. CONTRAST:  28m OMNIPAQUE IOHEXOL 300 MG/ML  SOLN COMPARISON:  CT abdomen pelvis dated 01/06/2018. FINDINGS: Lower chest: The visualized lung bases are clear. Calcified granuloma at the right lung base. No intra-abdominal free air or free fluid. Hepatobiliary: The liver is unremarkable. No intrahepatic biliary dilatation. There are multiple stones within the gallbladder. There is mild stranding of the fat plane in the right upper quadrant. This is likely secondary to right-sided pyelonephritis. Ultrasound may provide better evaluation of the gallbladder if there is clinical concern for acute cholecystitis. Pancreas: Unremarkable. No pancreatic ductal dilatation or surrounding inflammatory changes. Spleen: Normal in size without focal abnormality. Adrenals/Urinary Tract: The adrenal glands unremarkable there is faint haziness of the right renal parenchyma with heterogeneous enhancement consistent with pyelonephritis. There is enhancement of the urothelium of the right ureter. No drainable fluid collection or abscess. The urinary bladder is grossly unremarkable. Stomach/Bowel: There is no bowel obstruction or active inflammation. The appendix is normal. Vascular/Lymphatic: The abdominal aorta and IVC unremarkable. No portal venous gas. There is no adenopathy. Reproductive: The uterus is grossly unremarkable. There is a 3.4 x 3.3 cm posterior uterine fibroid.  No adnexal masses. Other: None Musculoskeletal: No acute or significant osseous findings. IMPRESSION: 1. Right-sided pyelonephritis. No drainable fluid collection or abscess. 2. Cholelithiasis. 3. No bowel obstruction. Normal appendix. 4. Uterine fibroid. Electronically Signed   By: AAnner CreteM.D.   On: 02/19/2021 01:19   UKoreaAbdomen Limited RUQ (LIVER/GB)  Result Date: 02/19/2021 CLINICAL DATA:  Cholelithiasis EXAM: ULTRASOUND ABDOMEN LIMITED RIGHT UPPER QUADRANT COMPARISON:  Abdominal CT from earlier today FINDINGS: Gallbladder: Shadowing gallstones. No gallbladder wall thickening or over distention. There is reported focal tenderness and mild pericholecystic fluid. Common bile duct: Diameter: 4 mm. Liver: No focal lesion identified. Within normal limits in parenchymal echogenicity. Portal vein is patent on color Doppler imaging with normal direction of blood flow towards the liver. Other: Right pyelonephritis by prior CT. IMPRESSION: 1. Cholelithiasis and right upper quadrant tenderness but no gallbladder over distension or wall thickening typical of acute cholecystitis. 2. Right pyelonephritis by prior CT. Electronically Signed   By: JJorje GuildM.D.   On: 02/19/2021 04:28    Procedures .Critical Care Performed by: HMerryl Hacker MD Authorized by: HMerryl Hacker MD   Critical care provider statement:    Critical care time (minutes):  45   Critical care was necessary to treat or prevent imminent or life-threatening deterioration of the following conditions:  Sepsis   Critical care was time spent personally by me on the following activities:  Development of treatment plan with patient or surrogate, discussions with consultants, evaluation of patient's response to treatment, examination of patient, ordering and review of laboratory studies, ordering and review of radiographic studies, ordering and performing treatments and interventions, pulse oximetry, re-evaluation of patient's  condition and review of old charts    Medications Ordered in ED Medications  ceFEPIme (MAXIPIME) 1 g in sodium chloride 0.9 % 100 mL IVPB (has no administration in time range)  metroNIDAZOLE (FLAGYL) IVPB 500 mg (500 mg Intravenous New Bag/Given 02/19/21 0438)  morphine 2 MG/ML injection 4 mg (4 mg Intravenous Given 02/18/21 2348)  ondansetron (ZOFRAN) injection 4 mg (4 mg Intravenous Given 02/18/21 2348)  iohexol (OMNIPAQUE) 300 MG/ML solution 80 mL (80 mLs Intravenous Contrast Given 02/19/21 0111)  sodium chloride 0.9 % bolus 1,000 mL (  1,000 mLs Intravenous New Bag/Given 02/19/21 0434)  morphine 4 MG/ML injection 4 mg (4 mg Intravenous Given 02/19/21 0434)  ondansetron (ZOFRAN) injection 4 mg (4 mg Intravenous Given 02/19/21 0434)    ED Course/ Medical Decision Making/ A&P                           Medical Decision Making  This patient presents to the ED for concern of abdominal/flank pain, fever, this involves an extensive number of treatment options, and is a complaint that carries with it a high risk of complications and morbidity.  The differential diagnosis includes pyelonephritis, cholecystitis, pancreatitis, less likely appendicitis  MDM:    This a 46 year old female who presents with abdominal pain, fevers.  She recently was diagnosed with UTI and has been on ciprofloxacin.  I reviewed her outside urine culture which showed 40 K of E. coli susceptible to Cipro.  She has had persistent chills and fevers.  She has tenderness on exam without signs of peritonitis.  Other consideration would be for gallbladder pathology.  Labs reviewed.  She has a leukocytosis to 18.  She is hyponatremic and hyperglycemic with a mild AKI.  She was given fluids, pain, nausea medication.  Given her white count, would be highly suspicious of infectious etiology and early sepsis.  CT scan obtained from triage reviewed.  It is indicative of likely pyelonephritis.  At this time I do not IV urine to correlate.  Given  that her prior culture was low bacterial growth and susceptible to Cipro, would also highly consider potential gallbladder pathology.  Right upper quadrant ultrasound obtained.  Confirms cholelithiasis.  No gallbladder wall thickening or fluid.  This is indeterminant.  Repeat urinalysis is negative for nitrates or leukocyte Estrace.  She does have 21-50 white cells and rare bacteria.  Culture was resent.  She has not been hemodynamically unstable.  She has received fluids, cefepime, and Flagyl to cover for both potential gallbladder source and pyelonephritis.  She does have a penicillin allergy.  We will plan for admission to the hospital.  Would consider surgery consultation in the morning if patient does not clinically improve. (Labs, imaging)  Labs: I Ordered, and personally interpreted labs.  The pertinent results include: Leukocytosis, hyponatremia, hyperglycemia   Imaging Studies ordered: I ordered imaging studies including CT, right upper quadrant ultrasound I independently visualized and interpreted imaging. I agree with the radiologist interpretation  Critical Interventions: Fluids, pain, nausea medication   Consultations Obtained: I requested consultation with the hospitalist,  and discussed lab and imaging findings as well as pertinent plan - they recommend: Admit  Reevaluation: After the interventions noted above, I reevaluated the patient and found that they have :improved  Social Determinants of Health: Lives at home with husband  Disposition: Admit  Co morbidities that complicate the patient evaluation  Past Medical History:  Diagnosis Date   Diabetes mellitus without complication ([place])       Additional history obtained from urgent care External records from outside source obtained and reviewed including none   Cardiac Monitoring: The patient was maintained on a cardiac monitor.  I personally viewed and interpreted the cardiac monitored which showed an  underlying rhythm of: Normal sinus rhythm   Medicines  Meds ordered this encounter  Medications   morphine 2 MG/ML injection 4 mg   ondansetron (ZOFRAN) injection 4 mg   iohexol (OMNIPAQUE) 300 MG/ML solution 80 mL   sodium chloride 0.9 % bolus 1,000  mL   morphine 4 MG/ML injection 4 mg   ondansetron (ZOFRAN) injection 4 mg   ceFEPIme (MAXIPIME) 1 g in sodium chloride 0.9 % 100 mL IVPB    Order Specific Question:   Antibiotic Indication:    Answer:   UTI   metroNIDAZOLE (FLAGYL) IVPB 500 mg    Order Specific Question:   Antibiotic Indication:    Answer:   Intra-abdominal Infection     I have reviewed the patients home medicines and have made adjustments as needed   Problem List / ED Course: Problem List Items Addressed This Visit       Genitourinary   Pyelonephritis - Primary   Relevant Medications   ceFEPIme (MAXIPIME) 1 g in sodium chloride 0.9 % 100 mL IVPB   metroNIDAZOLE (FLAGYL) IVPB 500 mg   Other Visit Diagnoses     Cholelithiasis       Relevant Orders   US Abdomen Limited RUQ (LIVER/GB) (Completed)                    Final Clinical Impression(s) / ED Diagnoses Final diagnoses:  Cholelithiasis  Pyelonephritis    Rx / DC Orders ED Discharge Orders     None         Merryl Hacker, MD 02/19/21 [phone]

## 2021-02-19 NOTE — ED Notes (Signed)
Provider at bedside

## 2021-02-19 NOTE — ED Notes (Signed)
Ambulated to restroom without assistance at this time to obtain UA sample

## 2021-02-19 NOTE — H&P (Addendum)
History and Physical    Katie Simmons WCB:[id number] DOB: 06-21-1975 DOA: 02/18/2021  Referring MD/NP/PA: Jennette Kettle, DO PCP: Patient, No Pcp Per (Inactive)  Patient coming from: Home  Chief Complaint: Abdominal pain  I have personally briefly reviewed patient's old medical records in [place]   HPI: Katie Simmons is a 46 y.o. female with medical history significant of hypertension, diabetes mellitus type 2, and pyelonephritis presents with complaints of abdominal pain.  History obtained with use of interpreter services.  Patient reports symptoms have been present for approximately 20 days, but acutely worsened yesterday afternoon around 3 PM.  Complains of right-sided abdominal pain and radiated to her right flank she rated as a 6-10 out of 10 on the pain scale.  Noted associated symptoms of subjective fevers that she reports were "high", nausea, vomiting, intermittent cough, and diarrhea.  She previously had been able to keep some food and liquids down until yesterday.  Whenever she felt like she had a fever she would also feel short of breath.  She had been seen at urgent care on 1/5 where urine cultures were obtained and had had grown out 40,000 colonies of E. Coli which was sensitive to ciprofloxacin.  She had been taking the ciprofloxacin as advised at home.  ED Course: Upon admission into the emergency department patient was seen to have temperature 99.4 F, pulse 88-118, respirations 17-28, blood pressure 94/68-1 20/71, and O2 saturation maintained on room air.  Labs significant for WBC 18.3, sodium 126, potassium 3.2, chloride 96, CO2 19, BUN 21, creatinine 1.08, glucose 342, anion gap 11, alkaline phosphatase 146, albumin 2.8, AST 117, ALT 46, and lactic acid 1.6 once able to be obtained.  Urinalysis was positive for moderate hemoglobin, negative leukocytes, rare bacteria, and 21-50 WBCs.  CT scan of the abdomen pelvis concerning for right pyelonephritis  with signs of cholelithiasis.  Patient had been given 1 L normal saline IV fluids, Zofran, morphine, metronidazole, and cefepime.  Subsequent right upper quadrant ultrasound noted cholelithiasis.  Review of Systems  Constitutional:  Positive for fever and malaise/fatigue.  Eyes:  Negative for pain.  Respiratory:  Positive for cough and shortness of breath.   Cardiovascular:  Negative for chest pain and leg swelling.  Gastrointestinal:  Positive for diarrhea, nausea and vomiting.  Genitourinary:  Positive for flank pain.  Musculoskeletal:  Negative for joint pain.  Neurological:  Negative for focal weakness and loss of consciousness.  Psychiatric/Behavioral:  Negative for substance abuse.    Past Medical History:  Diagnosis Date   Diabetes mellitus without complication ([place])     History reviewed. No pertinent surgical history.   reports that she has never smoked. She has never used smokeless tobacco. She reports that she does not drink alcohol and does not use drugs.  Allergies  Allergen Reactions   Penicillins Itching    Has patient had a PCN reaction causing immediate rash, facial/tongue/throat swelling, SOB or lightheadedness with hypotension: No Has patient had a PCN reaction causing severe rash involving mucus membranes or skin necrosis: No Has patient had a PCN reaction that required hospitalization: No Has patient had a PCN reaction occurring within the last 10 years: Yes If all of the above answers are "NO", then may proceed with Cephalosporin use.    History reviewed. No pertinent family history.  Prior to Admission medications   Medication Sig Start Date End Date Taking? Authorizing Provider  acetaminophen (TYLENOL) 500 MG tablet Take 500 mg by mouth  every 6 (six) hours as needed for moderate pain or headache.   Yes [provider]  ciprofloxacin (CIPRO) 500 MG tablet Take 1 tablet (500 mg total) by mouth every 12 (twelve) hours. Patient taking differently:  Take 500 mg by mouth See admin instructions. Bid x days 02/13/21  Yes Hazel Sams, PA-C  ibuprofen (ADVIL) 200 MG tablet Take 200 mg by mouth every 6 (six) hours as needed for headache or moderate pain.   Yes [provider]  metFORMIN (GLUCOPHAGE) 500 MG tablet Take 1 tablet (500 mg total) by mouth 2 (two) times daily with a meal. 01/26/21  Yes Banister, Gwenlyn Perking, MD  Blood Glucose Monitoring Suppl (TRUE METRIX METER) w/Device KIT 1 each by Does not apply route 3 (three) times daily. Patient not taking: Reported on 02/19/2021 06/28/15   Argentina Donovan, PA-C  cyclobenzaprine (FLEXERIL) 5 MG tablet Take 1 tablet (5 mg total) by mouth 2 (two) times daily as needed for muscle spasms. Patient not taking: Reported on 02/19/2021 10/20/17   Ward, Ozella Almond, PA-C  glucose blood (TRUE METRIX BLOOD GLUCOSE TEST) test strip Use as instructed Patient not taking: Reported on 02/19/2021 06/28/15   Argentina Donovan, PA-C  naproxen (NAPROSYN) 500 MG tablet Take 1 tablet (500 mg total) by mouth 2 (two) times daily as needed. Patient not taking: Reported on 02/19/2021 07/25/19   Lucrezia Starch, MD  TRUEPLUS LANCETS 28G MISC 1 each by Does not apply route 3 (three) times daily. Patient not taking: Reported on 02/19/2021 06/28/15   Argentina Donovan, PA-C  glipiZIDE (GLIPIZIDE XL) 5 MG 24 hr tablet Take 1 tablet (5 mg total) by mouth daily with breakfast. Patient not taking: Reported on 01/03/2018 08/20/17 10/10/18  Elsie Stain, MD    Physical Exam:  Constitutional: Middle-aged female who appears to be in discomfort especially movement Vitals:   02/19/21 0156 02/19/21 0445 02/19/21 0545 02/19/21 0700  BP: 111/78 120/71 116/77 94/68  Pulse: 99 93 91 88  Resp: '17 18 17 14  ' Temp: 98 F (36.7 C)     TempSrc: Oral     SpO2: 100% 97% 97% 95%  Weight:      Height:       Eyes: PERRL, lids and conjunctivae normal ENMT: Mucous membranes are moist. Posterior pharynx clear of any exudate or  lesions.   Neck: normal, supple, no masses, no thyromegaly Respiratory: clear to auscultation bilaterally, no wheezing, no crackles. Normal respiratory effort. No accessory muscle use.  Cardiovascular: Regular rate and rhythm, no murmurs / rubs / gallops. No extremity edema. 2+ pedal pulses. No carotid bruits.  Abdomen: Tenderness palpation on the right quadrant with CVA tenderness appreciated. Musculoskeletal: no clubbing / cyanosis. No joint deformity upper and lower extremities.  Normal muscle tone.  Skin: no rashes, lesions, ulcers. No induration Neurologic: CN 2-12 grossly intact. Strength 5/5 in all 4.  Psychiatric: Normal judgment and insight. Normal mood.     Labs on Admission: I have personally reviewed following labs and imaging studies  CBC: Recent Labs  Lab 02/18/21 2327  WBC 18.3*  NEUTROABS 16.8*  HGB 13.2  HCT 38.1  MCV 86.6  PLT 732   Basic Metabolic Panel: Recent Labs  Lab 02/18/21 2327  NA 126*  K 3.2*  CL 96*  CO2 19*  GLUCOSE 342*  BUN 21*  CREATININE 1.08*  CALCIUM 7.7*   GFR: Estimated Creatinine Clearance: 44.2 mL/min (A) (by C-G formula based on SCr of 1.08 mg/dL (  H)). Liver Function Tests: Recent Labs  Lab 02/18/21 2327  AST 117*  ALT 46*  ALKPHOS 146*  BILITOT 0.9  PROT 6.7  ALBUMIN 2.8*   Recent Labs  Lab 02/18/21 2327  LIPASE 27   No results for input(s): AMMONIA in the last 168 hours. Coagulation Profile: No results for input(s): INR, PROTIME in the last 168 hours. Cardiac Enzymes: No results for input(s): CKTOTAL, CKMB, CKMBINDEX, TROPONINI in the last 168 hours. BNP (last 3 results) No results for input(s): PROBNP in the last 8760 hours. HbA1C: No results for input(s): HGBA1C in the last 72 hours. CBG: No results for input(s): GLUCAP in the last 168 hours. Lipid Profile: No results for input(s): CHOL, HDL, LDLCALC, TRIG, CHOLHDL, LDLDIRECT in the last 72 hours. Thyroid Function Tests: No results for input(s): TSH,  T4TOTAL, FREET4, T3FREE, THYROIDAB in the last 72 hours. Anemia Panel: No results for input(s): VITAMINB12, FOLATE, FERRITIN, TIBC, IRON, RETICCTPCT in the last 72 hours. Urine analysis:    Component Value Date/Time   COLORURINE YELLOW 02/19/2021 0440   APPEARANCEUR CLOUDY (A) 02/19/2021 0440   LABSPEC <1.005 (L) 02/19/2021 0440   PHURINE 5.5 02/19/2021 0440   GLUCOSEU >=500 (A) 02/19/2021 0440   HGBUR MODERATE (A) 02/19/2021 0440   BILIRUBINUR NEGATIVE 02/19/2021 0440   BILIRUBINUR negative 07/11/2015 1634   KETONESUR 15 (A) 02/19/2021 0440   PROTEINUR 30 (A) 02/19/2021 0440   UROBILINOGEN 1.0 02/13/2021 2008   NITRITE NEGATIVE 02/19/2021 0440   LEUKOCYTESUR NEGATIVE 02/19/2021 0440   Sepsis Labs: Recent Results (from the past 240 hour(s))  Urine Culture     Status: Abnormal   Collection Time: 02/13/21  8:02 PM   Specimen: Urine, Clean Catch  Result Value Ref Range Status   Specimen Description URINE, CLEAN CATCH  Final   Special Requests   Final    NONE Performed at [facility], [place] [address], [place], [place] [address]    Culture 40,000 COLONIES/mL ESCHERICHIA COLI (A)  Final   Report Status 02/16/2021 FINAL  Final   Organism ID, Bacteria ESCHERICHIA COLI (A)  Final      Susceptibility   Escherichia coli - MIC*    AMPICILLIN >=32 RESISTANT Resistant     CEFAZOLIN <=4 SENSITIVE Sensitive     CEFEPIME <=0.12 SENSITIVE Sensitive     CEFTRIAXONE <=0.25 SENSITIVE Sensitive     CIPROFLOXACIN <=0.25 SENSITIVE Sensitive     GENTAMICIN <=1 SENSITIVE Sensitive     IMIPENEM <=0.25 SENSITIVE Sensitive     NITROFURANTOIN <=16 SENSITIVE Sensitive     TRIMETH/SULFA <=20 SENSITIVE Sensitive     AMPICILLIN/SULBACTAM >=32 RESISTANT Resistant     PIP/TAZO <=4 SENSITIVE Sensitive     * 40,000 COLONIES/mL ESCHERICHIA COLI     Radiological Exams on Admission: CT ABDOMEN PELVIS W CONTRAST  Result Date: 02/19/2021 CLINICAL DATA:  Abdominal pain. EXAM: CT ABDOMEN AND PELVIS  WITH CONTRAST TECHNIQUE: Multidetector CT imaging of the abdomen and pelvis was performed using the standard protocol following bolus administration of intravenous contrast. CONTRAST:  55m OMNIPAQUE IOHEXOL 300 MG/ML  SOLN COMPARISON:  CT abdomen pelvis dated 01/06/2018. FINDINGS: Lower chest: The visualized lung bases are clear. Calcified granuloma at the right lung base. No intra-abdominal free air or free fluid. Hepatobiliary: The liver is unremarkable. No intrahepatic biliary dilatation. There are multiple stones within the gallbladder. There is mild stranding of the fat plane in the right upper quadrant. This is likely secondary to right-sided pyelonephritis. Ultrasound may provide better evaluation  of the gallbladder if there is clinical concern for acute cholecystitis. Pancreas: Unremarkable. No pancreatic ductal dilatation or surrounding inflammatory changes. Spleen: Normal in size without focal abnormality. Adrenals/Urinary Tract: The adrenal glands unremarkable there is faint haziness of the right renal parenchyma with heterogeneous enhancement consistent with pyelonephritis. There is enhancement of the urothelium of the right ureter. No drainable fluid collection or abscess. The urinary bladder is grossly unremarkable. Stomach/Bowel: There is no bowel obstruction or active inflammation. The appendix is normal. Vascular/Lymphatic: The abdominal aorta and IVC unremarkable. No portal venous gas. There is no adenopathy. Reproductive: The uterus is grossly unremarkable. There is a 3.4 x 3.3 cm posterior uterine fibroid. No adnexal masses. Other: None Musculoskeletal: No acute or significant osseous findings. IMPRESSION: 1. Right-sided pyelonephritis. No drainable fluid collection or abscess. 2. Cholelithiasis. 3. No bowel obstruction. Normal appendix. 4. Uterine fibroid. Electronically Signed   By: Anner Crete M.D.   On: 02/19/2021 01:19   US Abdomen Limited RUQ (LIVER/GB)  Result Date:  02/19/2021 CLINICAL DATA:  Cholelithiasis EXAM: ULTRASOUND ABDOMEN LIMITED RIGHT UPPER QUADRANT COMPARISON:  Abdominal CT from earlier today FINDINGS: Gallbladder: Shadowing gallstones. No gallbladder wall thickening or over distention. There is reported focal tenderness and mild pericholecystic fluid. Common bile duct: Diameter: 4 mm. Liver: No focal lesion identified. Within normal limits in parenchymal echogenicity. Portal vein is patent on color Doppler imaging with normal direction of blood flow towards the liver. Other: Right pyelonephritis by prior CT. IMPRESSION: 1. Cholelithiasis and right upper quadrant tenderness but no gallbladder over distension or wall thickening typical of acute cholecystitis. 2. Right pyelonephritis by prior CT. Electronically Signed   By: Jorje Guild M.D.   On: 02/19/2021 04:28    EKG: Independently reviewed.  Sinus tachycardia 106 bpm  Assessment/Plan Sepsis secondary to pyelonephritis: Upon admission patient was noted to be tachycardic and tachypneic with WBC 18.3.  Lactic acid was obtained after initial therapies and noted to be 1.6.  She had been on treatment of ciprofloxacin for UTI diagnosed on 1/5 which grew out E. coli 40,000 colonies.  Urinalysis noted glucose, moderate hemoglobin, 15 ketones, rare bacteria, 21-50 WBCs, and 6-10 squamous epithelial cells.  CT scan of the abdomen pelvis noted right-sided pyelonephritis without drainable abscess or fluid collection.  Sepsis secondary to follow nephritis with endorgan damage of acute kidney injury.  Patient had been initially given fluid bolus, cefepime, and metronidazole IV. -Admit to a medical telemetry bed -Follow-up blood cultures -Continue empiric antibiotics of cefepime -Acetaminophen as needed for fever  Nausea, vomiting, and diarrhea: Acute.  Patient noted to have persistent nausea and vomiting despite initial antiemetics given. -Monitor intake and output -Aspiration precaution -Elevate head of the  bed -Antiemetics as needed  Acute kidney injury: Patient presents with creatinine of 1.08 with BUN 21.  Baseline creatinine previously noted to be around 0.3- 0.5.  Suspect likely prerenal in nature given nausea and vomiting. -Normal saline at 125 mL/h with 20 mEq of KCl -Recheck kidney function tomorrow morning  Diabetes mellitus type 2, uncontrolled: On admission glucose elevated to 342, CO2 19, and anion gap 11.  Home medication regimen includes metformin 500 mg twice daily. -Hypoglycemic protocols -Hold metformin -CBGs before every meal with moderate SSI -Adjust insulin regimen as needed  Hypokalemia: Acute.  On admission potassium 3.2.  Suspect likely related with nausea and vomiting. -IV fluids as noted above -Continue to monitor potassium levels and adjust fluids as needed  Hyponatremia: Acute.  Sodium initially 126, and when corrected for  hyperglycemia of 342 noted to be 132. -Continue IV fluids as noted above  Cholelithiasis: Noted on CT scan of the abdomen noted cholelithiasis without signs of cholecystitis.  Ultrasound also noted cholelithiasis without signs of cholecystitis.   -Follow-up repeat CMP tomorrow morning -May benefit from outpatient referral to general surgery for possible need of  Transaminitis: Acute.  Labs noted alkaline phosphatase 146, AST 117, and ALT 46. -Add on hepatitis panel -Recheck CMP tomorrow morning  DVT prophylaxis: Lovenox Code Status: Full Family Communication: Patient's partner updated over the phone Disposition Plan: Hopefully discharge home once medically Consults called: None Admission status: Inpatient, require more than 2 midnight stay  Norval Morton MD [facility]   If 7PM-7AM, please contact night-coverage   02/19/2021, 7:14 AM

## 2021-02-19 NOTE — ED Notes (Signed)
Pt continues to endorse nausea and vomited three times since Zofran administration. Compazine IVP given at this time. Will continue to monitor.

## 2021-02-20 LAB — COMPREHENSIVE METABOLIC PANEL
ALT: 30 U/L (ref 0–44)
AST: 26 U/L (ref 15–41)
Albumin: 2.1 g/dL — ABNORMAL LOW (ref 3.5–5.0)
Alkaline Phosphatase: 90 U/L (ref 38–126)
Anion gap: 8 (ref 5–15)
BUN: 11 mg/dL (ref 6–20)
CO2: 20 mmol/L — ABNORMAL LOW (ref 22–32)
Calcium: 7.8 mg/dL — ABNORMAL LOW (ref 8.9–10.3)
Chloride: 107 mmol/L (ref 98–111)
Creatinine, Ser: 0.55 mg/dL (ref 0.44–1.00)
GFR, Estimated: >60 mL/min (ref >60)
Glucose, Bld: 250 mg/dL — ABNORMAL HIGH (ref 70–99)
Potassium: 3.7 mmol/L (ref 3.5–5.1)
Sodium: 135 mmol/L (ref 135–145)
Total Bilirubin: 0.5 mg/dL (ref 0.3–1.2)
Total Protein: 5.7 g/dL — ABNORMAL LOW (ref 6.5–8.1)

## 2021-02-20 LAB — CBC
HCT: 32.2 % — ABNORMAL LOW (ref 36.0–46.0)
Hemoglobin: 10.7 g/dL — ABNORMAL LOW (ref 12.0–15.0)
MCH: 29.6 pg (ref 26.0–34.0)
MCHC: 33.2 g/dL (ref 30.0–36.0)
MCV: 89 fL (ref 80.0–100.0)
Platelets: 220 10*3/uL (ref 150–400)
RBC: 3.62 MIL/uL — ABNORMAL LOW (ref 3.87–5.11)
RDW: 13.2 % (ref 11.5–15.5)
WBC: 18.2 10*3/uL — ABNORMAL HIGH (ref 4.0–10.5)
nRBC: 0 % (ref 0.0–0.2)

## 2021-02-20 LAB — GLUCOSE, CAPILLARY
Glucose-Capillary: 305 mg/dL — ABNORMAL HIGH (ref 70–99)
Glucose-Capillary: 251 mg/dL — ABNORMAL HIGH (ref 70–99)
Glucose-Capillary: 268 mg/dL — ABNORMAL HIGH (ref 70–99)
Glucose-Capillary: 233 mg/dL — ABNORMAL HIGH (ref 70–99)

## 2021-02-20 LAB — URINE CULTURE: Culture: 10000 — AB

## 2021-02-20 LAB — HIV ANTIBODY (ROUTINE TESTING W REFLEX): HIV Screen 4th Generation wRfx: NONREACTIVE

## 2021-02-20 MED ORDER — INSULIN STARTER KIT- PEN NEEDLES (SPANISH)
1.0000 | Freq: Once | Status: AC
  Administered 2021-02-22: 1
  Filled 2021-02-20: qty 1

## 2021-02-20 MED ORDER — INSULIN ASPART PROT & ASPART (70-30 MIX) 100 UNIT/ML ~~LOC~~ SUSP
8.0000 [IU] | Freq: Two times a day (BID) | SUBCUTANEOUS | Status: DC
  Administered 2021-02-20 – 2021-02-21 (×3): 8 [IU] via SUBCUTANEOUS
  Filled 2021-02-20: qty 10

## 2021-02-20 MED ORDER — LIVING WELL WITH DIABETES BOOK - IN SPANISH
Freq: Once | Status: AC
  Filled 2021-02-20: qty 1

## 2021-02-20 NOTE — Progress Notes (Addendum)
Inpatient Diabetes Program Recommendations  AACE/ADA: New Consensus Statement on Inpatient Glycemic Control (2015)  Target Ranges:  Prepandial:   less than 140 mg/dL      Peak postprandial:   less than 180 mg/dL (1-2 hours)      Critically ill patients:  140 - 180 mg/dL   Lab Results  Component Value Date   GLUCAP 305 (H) 02/20/2021   HGBA1C 14.7 (H) 02/19/2021    Review of Glycemic Control  Latest Reference Range & Units 02/20/21 07:49  Glucose-Capillary 70 - 99 mg/dL 305 (H)  (H): Data is abnormally high Diabetes history: Type 2 DM Outpatient Diabetes medications: Metformin 500 mg BID Current orders for Inpatient glycemic control: Novolog 0-15 units TID  Inpatient Diabetes Program Recommendations:    Consider adding Novolog 70/30 8 units BID.  Will plan to see.   Addendum: Spoke with patient regarding outpatient diabetes management. Confirmed Metformin BID; never been on insulin prior. Reviewed patient's current A1c of 14.7%. Explained what a A1c is and what it measures. Also reviewed goal A1c with patient, importance of good glucose control @ home, and blood sugar goals. Reviewed patho of DM, need for insulin, role of pancreas, signs and symptoms of hypo vs hyper glycemia, interventions, vascular changes, risk for infection and comorbidites. Patient does not have a meter. Provided meter kit and reviewed recommended frequency and when to call MD.  [facility] consult, dietitian, insulin pen starter kit and LWWDM ordered.  Educated patient  on insulin pen use at home. Reviewed contents of insulin flexpen starter kit. Reviewed all steps if insulin pen including attachment of needle, 2-unit air shot, dialing up dose, giving injection, removing needle, disposal of sharps, storage of unused insulin, disposal of insulin etc. Patient able to provide successful return demonstration. Also reviewed troubleshooting with insulin pen. MD to give patient Rxs for insulin pens and insulin pen  needles.  Following filling prescriptions at [facility], scripts for discharge need to be sent to [facility]. Information given to patient, reviewed survival skills with 70/30, supplies and how to obtain. Discussed with TOC pharmacy.   Thanks, Bronson Curb, MSN, RNC-OB Diabetes Coordinator [phone] (8a-5p)

## 2021-02-20 NOTE — TOC Initial Note (Signed)
Transition of Care [facility]) - Initial/Assessment Note    Patient Details  Name: Katie Simmons MRN: [id number] Date of Birth: March 06, 1975  Transition of Care [facility]) CM/SW Contact:    Cyndi Bender, RN Phone Number: 02/20/2021, 3:18 PM  Clinical Narrative:                 Used interpreter to speak to patient about transition needs.  Patient has DM and her meter was broken. Diabetic coordinator gave patient a new meter. Patient lives with her significant other.She has no insurance and no PCP. I made her a PCP apt at [facility] and patient is aware. I will put [facility] number to help with transportation if needed to PCP apt. Patient will need [place] letter at discharge.   Address, Phone number verified  Expected Discharge Plan: Home/Self Care Barriers to Discharge: Continued Medical Work up   Patient Goals and CMS Choice Patient states their goals for this hospitalization and ongoing recovery are:: return home      Expected Discharge Plan and Services Expected Discharge Plan: Home/Self Care       Living arrangements for the past 2 months: Apartment                                      Prior Living Arrangements/Services Living arrangements for the past 2 months: Apartment Lives with:: Significant Other Patient language and need for interpreter reviewed:: Yes (needs interpreter) Do you feel safe going back to the place where you live?: Yes      Need for Family Participation in Patient Care: Yes (Comment) Care giver support system in place?: Yes (comment)   Criminal Activity/Legal Involvement Pertinent to Current Situation/Hospitalization: No - Comment as needed  Activities of Daily Living Home Assistive Devices/Equipment: Eyeglasses ADL Screening (condition at time of admission) Patient's cognitive ability adequate to safely complete daily activities?: Yes Is the patient deaf or have difficulty hearing?: No Does the patient have difficulty seeing, even  when wearing glasses/contacts?: Yes Does the patient have difficulty concentrating, remembering, or making decisions?: No Patient able to express need for assistance with ADLs?: No Does the patient have difficulty dressing or bathing?: No Independently performs ADLs?: Yes (appropriate for developmental age) Does the patient have difficulty walking or climbing stairs?: No Weakness of Legs: None Weakness of Arms/Hands: None  Permission Sought/Granted                  Emotional Assessment Appearance:: Appears stated age Attitude/Demeanor/Rapport: Engaged Affect (typically observed): Accepting Orientation: : Oriented to Self, Oriented to  Time, Oriented to Situation, Oriented to Place Alcohol / Substance Use: Not Applicable Psych Involvement: No (comment)  Admission diagnosis:  Pyelonephritis [N12] Cholelithiasis [K80.20] Patient Active Problem List   Diagnosis Date Noted   Nausea vomiting and diarrhea 02/19/2021   AKI (acute kidney injury) ([place]) 02/19/2021   Hypokalemia 02/19/2021   Hyponatremia 02/19/2021   Cholelithiasis 02/19/2021   Transaminitis 02/19/2021   E coli bacteremia 01/06/2018   Sepsis ([place]) 01/03/2018   Diabetes mellitus without complication ([place]) [id number]   Pyelonephritis    Hypotension    PCP:  Patient, No Pcp Per (Inactive) Pharmacy:   [facility] [place], [place] [address] [place] [address] Phone: [phone] Fax: [place] [facility] [place]  [address], [place] [address] Phone: [phone] Fax: [place] [address] [place] [address] Phone: [phone] Fax: [phone]     Social Determinants of Health (SDOH) Interventions    Readmission Risk Interventions No flowsheet data found.

## 2021-02-20 NOTE — Progress Notes (Signed)
°  Transition of Care [facility]) Screening Note   Patient Details  Name: Katie Simmons Date of Birth: April 01, 1975   [facility]) CM/SW Contact:    Mearl Latin, LCSW Phone Number: 02/20/2021, 9:02 AM    Transition of Care Department [facility]) has reviewed patient and no TOC needs have been identified at this time. We will continue to monitor patient advancement through interdisciplinary progression rounds. If new patient transition needs arise, please place a TOC consult.

## 2021-02-20 NOTE — Plan of Care (Signed)

## 2021-02-20 NOTE — Progress Notes (Signed)
PROGRESS NOTE    Katie Simmons  LHT:[id number] DOB: [birth year] DOA: 02/18/2021 PCP: Patient, No Pcp Per (Inactive)   Chief Complaint  Patient presents with   Abdominal Pain    Brief Narrative:      HPI: Katie Simmons is a 46 y.o. female with medical history significant of hypertension, diabetes mellitus type 2, and pyelonephritis presents with complaints of abdominal pain.  History obtained with use of interpreter services.  Patient reports symptoms have been present for approximately 20 days, but acutely worsened yesterday afternoon around 3 PM.  Complains of right-sided abdominal pain and radiated to her right flank she rated as a 6-10 out of 10 on the pain scale.  Noted associated symptoms of subjective fevers that she reports were "high", nausea, vomiting, intermittent cough, and diarrhea.  She previously had been able to keep some food and liquids down until yesterday.  Whenever she felt like she had a fever she would also feel short of breath.  She had been seen at urgent care on 1/5 where urine cultures were obtained and had had grown out 40,000 colonies of E. Coli which was sensitive to ciprofloxacin.  She had been taking the ciprofloxacin as advised at home.   ED Course: Upon admission into the emergency department patient was seen to have temperature 99.4 F, pulse 88-118, respirations 17-28, blood pressure 94/68-1 20/71, and O2 saturation maintained on room air.  Labs significant for WBC 18.3, sodium 126, potassium 3.2, chloride 96, CO2 19, BUN 21, creatinine 1.08, glucose 342, anion gap 11, alkaline phosphatase 146, albumin 2.8, AST 117, ALT 46, and lactic acid 1.6 once able to be obtained.  Urinalysis was positive for moderate hemoglobin, negative leukocytes, rare bacteria, and 21-50 WBCs.  CT scan of the abdomen pelvis concerning for right pyelonephritis with signs of cholelithiasis.  Patient had been given 1 L normal saline IV fluids, Zofran, morphine,  metronidazole, and cefepime.  Subsequent right upper quadrant ultrasound noted cholelithiasis.  Assessment & Plan:   Principal Problem:   Pyelonephritis Active Problems:   Sepsis ([place])   Diabetes mellitus without complication (HCC)   Nausea vomiting and diarrhea   AKI (acute kidney injury) ([place])   Hypokalemia   Hyponatremia   Cholelithiasis   Transaminitis    Sepsis secondary to pyelonephritis:  -SIRS present on admission upon admission patient was noted to be tachycardic and tachypneic, with significant leukocytosis . -She failed outpatient Cipro floxacillin treatment  -Continue with cefepime for next 24 hours, then hopefully can narrow to Rocephin as long care white  -CT scan of the abdomen pelvis noted right-sided pyelonephritis without drainable abscess or fluid collection.  Sepsis secondary to follow nephritis with endorgan damage of acute kidney injury.   -Continue empiric antibiotics of cefepime -Continue with IV fluids   Nausea, vomiting, and diarrhea:  -Due to sepsis, resolved   Acute kidney injury: Patient presents with creatinine of 1.08 with BUN 21.  Baseline creatinine previously noted to be around 0.3- 0.5.  Suspect likely prerenal in nature given nausea and vomiting. -Proving with IV fluids  Diabetes mellitus type 2, uncontrolled: On admission glucose elevated to 342, CO2 19, and anion gap 11.  Home medication regimen includes metformin 500 mg twice daily. -Hypoglycemic protocols -Hold metformin -Continue with insulin sliding scale, started on insulin 70/30 8 units twice daily   Hypokalemia: Acute.   -Repleted   Hyponatremia: Acute.  Sodium initially 126, and when corrected for hyperglycemia of 342 noted to be 132. -Improved  with IV fluids   Cholelithiasis: Noted on CT scan of the abdomen noted cholelithiasis without signs of cholecystitis.  Ultrasound also noted cholelithiasis without signs of cholecystitis.   -LFTs trending down, sepsis related to  pyelonephritis, and unlikely due to gallbladder infection   Transaminitis: Acute.   -Due to sepsis, resolved. -Negative hepatitis panel    DVT prophylaxis: Lovenox Code Status: Full Family Communication: None at bedside, she was seen with the assistance of video interpreter Disposition:   Status is: Inpatient  Remains inpatient appropriate because: need for IV ABX as she failed oral cipro X5 days       Consultants:  None   Subjective:  Patient reports some chills,Patient reports chills, right abd/flank pain  Objective: Vitals:   02/19/21 2347 02/20/21 0337 02/20/21 0750 02/20/21 1154  BP: 98/62 116/74 121/70 (!) 109/96  Pulse: 91 94 91 90  Resp: '20 17 20 13  ' Temp: 98.5 F (36.9 C) 98.7 F (37.1 C) 99.4 F (37.4 C) 99 F (37.2 C)  TempSrc: Oral Oral Oral Oral  SpO2: 96% 97% 98% 97%  Weight:      Height:        Intake/Output Summary (Last 24 hours) at 02/20/2021 1349 Last data filed at 02/20/2021 0618 Gross per 24 hour  Intake 2640.66 ml  Output --  Net 2640.66 ml   Filed Weights   02/18/21 2324  Weight: 52 kg    Examination:  Awake Alert, Oriented X 3, No new F.N deficits, Normal affect Symmetrical Chest wall movement, Good air movement bilaterally, CTAB RRR,No Gallops,Rubs or new Murmurs, No Parasternal Heave Has right CVA tenderness No Cyanosis, Clubbing or edema, No new Rash or bruise       Data Reviewed: I have personally reviewed following labs and imaging studies  CBC: Recent Labs  Lab 02/18/21 2327 02/20/21 0128  WBC 18.3* 18.2*  NEUTROABS 16.8*  --   HGB 13.2 10.7*  HCT 38.1 32.2*  MCV 86.6 89.0  PLT 250 240    Basic Metabolic Panel: Recent Labs  Lab 02/18/21 2327 02/19/21 1014 02/20/21 0128  NA 126*  --  135  K 3.2*  --  3.7  CL 96*  --  107  CO2 19*  --  20*  GLUCOSE 342*  --  250*  BUN 21*  --  11  CREATININE 1.08*  --  0.55  CALCIUM 7.7*  --  7.8*  MG  --  1.8  --     GFR: Estimated Creatinine Clearance:  59.7 mL/min (by C-G formula based on SCr of 0.55 mg/dL).  Liver Function Tests: Recent Labs  Lab 02/18/21 2327 02/20/21 0128  AST 117* 26  ALT 46* 30  ALKPHOS 146* 90  BILITOT 0.9 0.5  PROT 6.7 5.7*  ALBUMIN 2.8* 2.1*    CBG: Recent Labs  Lab 02/19/21 1016 02/19/21 1638 02/19/21 2059 02/20/21 0749 02/20/21 1153  GLUCAP 378* 250* 249* 305* 251*     Recent Results (from the past 240 hour(s))  Urine Culture     Status: Abnormal   Collection Time: 02/13/21  8:02 PM   Specimen: Urine, Clean Catch  Result Value Ref Range Status   Specimen Description URINE, CLEAN CATCH  Final   Special Requests   Final    NONE Performed at [facility], [place] [address], [place], [place] [address]    Culture 40,000 COLONIES/mL ESCHERICHIA COLI (A)  Final   Report Status 02/16/2021 FINAL  Final   Organism ID, Bacteria  ESCHERICHIA COLI (A)  Final      Susceptibility   Escherichia coli - MIC*    AMPICILLIN >=32 RESISTANT Resistant     CEFAZOLIN <=4 SENSITIVE Sensitive     CEFEPIME <=0.12 SENSITIVE Sensitive     CEFTRIAXONE <=0.25 SENSITIVE Sensitive     CIPROFLOXACIN <=0.25 SENSITIVE Sensitive     GENTAMICIN <=1 SENSITIVE Sensitive     IMIPENEM <=0.25 SENSITIVE Sensitive     NITROFURANTOIN <=16 SENSITIVE Sensitive     TRIMETH/SULFA <=20 SENSITIVE Sensitive     AMPICILLIN/SULBACTAM >=32 RESISTANT Resistant     PIP/TAZO <=4 SENSITIVE Sensitive     * 40,000 COLONIES/mL ESCHERICHIA COLI  Urine Culture     Status: Abnormal   Collection Time: 02/19/21  4:49 AM   Specimen: Urine, Clean Catch  Result Value Ref Range Status   Specimen Description URINE, CLEAN CATCH  Final   Special Requests NONE  Final   Culture (A)  Final    10,000 COLONIES/mL LACTOBACILLUS SPECIES Standardized susceptibility testing for this organism is not available. Performed at [facility], [place] [address], [place], [place] [address]    Report Status 02/20/2021 FINAL  Final  Resp Panel by RT-PCR (Flu  A&B, Covid) Nasopharyngeal Swab     Status: None   Collection Time: 02/19/21  9:19 AM   Specimen: Nasopharyngeal Swab; Nasopharyngeal(NP) swabs in vial transport medium  Result Value Ref Range Status   SARS Coronavirus 2 by RT PCR NEGATIVE NEGATIVE Final    Comment: (NOTE) SARS-CoV-2 target nucleic acids are NOT DETECTED.  The SARS-CoV-2 RNA is generally detectable in upper respiratory specimens during the acute phase of infection. The lowest concentration of SARS-CoV-2 viral copies this assay can detect is 138 copies/mL. A negative result does not preclude SARS-Cov-2 infection and should not be used as the sole basis for treatment or other patient management decisions. A negative result may occur with  improper specimen collection/handling, submission of specimen other than nasopharyngeal swab, presence of viral mutation(s) within the areas targeted by this assay, and inadequate number of viral copies(<138 copies/mL). A negative result must be combined with clinical observations, patient history, and epidemiological information. The expected result is Negative.  Fact Sheet for Patients:  EntrepreneurPulse.com.au  Fact Sheet for Healthcare Providers:  IncredibleEmployment.be  This test is no t yet approved or cleared by the [place] FDA and  has been authorized for detection and/or diagnosis of SARS-CoV-2 by FDA under an Emergency Use Authorization (EUA). This EUA will remain  in effect (meaning this test can be used) for the duration of the COVID-19 declaration under Section 564(b)(1) of the Act, 21 U.S.C.section 360bbb-3(b)(1), unless the authorization is terminated  or revoked sooner.       Influenza A by PCR NEGATIVE NEGATIVE Final   Influenza B by PCR NEGATIVE NEGATIVE Final    Comment: (NOTE) The Xpert Xpress SARS-CoV-2/FLU/RSV plus assay is intended as an aid in the diagnosis of influenza from Nasopharyngeal swab specimens  and should not be used as a sole basis for treatment. Nasal washings and aspirates are unacceptable for Xpert Xpress SARS-CoV-2/FLU/RSV testing.  Fact Sheet for Patients: EntrepreneurPulse.com.au  Fact Sheet for Healthcare Providers: IncredibleEmployment.be  This test is not yet approved or cleared by the [place] FDA and has been authorized for detection and/or diagnosis of SARS-CoV-2 by FDA under an Emergency Use Authorization (EUA). This EUA will remain in effect (meaning this test can be used) for the duration of the COVID-19 declaration under Section 564(b)(1)  of the Act, 21 U.S.C. section 360bbb-3(b)(1), unless the authorization is terminated or revoked.  Performed at [facility], [place] [address] [place], [place] [address]          Radiology Studies: CT ABDOMEN PELVIS W CONTRAST  Result Date: 02/19/2021 CLINICAL DATA:  Abdominal pain. EXAM: CT ABDOMEN AND PELVIS WITH CONTRAST TECHNIQUE: Multidetector CT imaging of the abdomen and pelvis was performed using the standard protocol following bolus administration of intravenous contrast. CONTRAST:  46m OMNIPAQUE IOHEXOL 300 MG/ML  SOLN COMPARISON:  CT abdomen pelvis dated 01/06/2018. FINDINGS: Lower chest: The visualized lung bases are clear. Calcified granuloma at the right lung base. No intra-abdominal free air or free fluid. Hepatobiliary: The liver is unremarkable. No intrahepatic biliary dilatation. There are multiple stones within the gallbladder. There is mild stranding of the fat plane in the right upper quadrant. This is likely secondary to right-sided pyelonephritis. Ultrasound may provide better evaluation of the gallbladder if there is clinical concern for acute cholecystitis. Pancreas: Unremarkable. No pancreatic ductal dilatation or surrounding inflammatory changes. Spleen: Normal in size without focal abnormality. Adrenals/Urinary Tract: The adrenal glands unremarkable  there is faint haziness of the right renal parenchyma with heterogeneous enhancement consistent with pyelonephritis. There is enhancement of the urothelium of the right ureter. No drainable fluid collection or abscess. The urinary bladder is grossly unremarkable. Stomach/Bowel: There is no bowel obstruction or active inflammation. The appendix is normal. Vascular/Lymphatic: The abdominal aorta and IVC unremarkable. No portal venous gas. There is no adenopathy. Reproductive: The uterus is grossly unremarkable. There is a 3.4 x 3.3 cm posterior uterine fibroid. No adnexal masses. Other: None Musculoskeletal: No acute or significant osseous findings. IMPRESSION: 1. Right-sided pyelonephritis. No drainable fluid collection or abscess. 2. Cholelithiasis. 3. No bowel obstruction. Normal appendix. 4. Uterine fibroid. Electronically Signed   By: AAnner CreteM.D.   On: 02/19/2021 01:19   UKoreaAbdomen Limited RUQ (LIVER/GB)  Result Date: 02/19/2021 CLINICAL DATA:  Cholelithiasis EXAM: ULTRASOUND ABDOMEN LIMITED RIGHT UPPER QUADRANT COMPARISON:  Abdominal CT from earlier today FINDINGS: Gallbladder: Shadowing gallstones. No gallbladder wall thickening or over distention. There is reported focal tenderness and mild pericholecystic fluid. Common bile duct: Diameter: 4 mm. Liver: No focal lesion identified. Within normal limits in parenchymal echogenicity. Portal vein is patent on color Doppler imaging with normal direction of blood flow towards the liver. Other: Right pyelonephritis by prior CT. IMPRESSION: 1. Cholelithiasis and right upper quadrant tenderness but no gallbladder over distension or wall thickening typical of acute cholecystitis. 2. Right pyelonephritis by prior CT. Electronically Signed   By: JJorje GuildM.D.   On: 02/19/2021 04:28        Scheduled Meds:  enoxaparin (LOVENOX) injection  40 mg Subcutaneous Q24H   insulin aspart  0-15 Units Subcutaneous TID WC   insulin aspart protamine- aspart   8 Units Subcutaneous BID WC   insulin starter kit- pen needles  1 kit Other Once   living well with diabetes book- in spanish   Does not apply Once   Continuous Infusions:  0.9 % NaCl with KCl 20 mEq / L 125 mL/hr at 02/20/21 1202   ceFEPime (MAXIPIME) IV 2 g (02/20/21 0825)     LOS: 1 day        DPhillips Climes MD [facility]   To contact the attending provider between 7A-7P or the covering provider during after hours 7P-7A, please log into the web site [email or web address] and access using universal  password for  that web site. If you do not have the password, please call the hospital operator.  02/20/2021, 1:49 PM   Patient ID: Mariel Aloe, female   DOB: 1975-11-11, 46 y.o.   MRN: [id number]

## 2021-02-21 ENCOUNTER — Other Ambulatory Visit (HOSPITAL_COMMUNITY): Payer: Self-pay

## 2021-02-21 LAB — GLUCOSE, CAPILLARY
Glucose-Capillary: 324 mg/dL — ABNORMAL HIGH (ref 70–99)
Glucose-Capillary: 240 mg/dL — ABNORMAL HIGH (ref 70–99)
Glucose-Capillary: 153 mg/dL — ABNORMAL HIGH (ref 70–99)
Glucose-Capillary: 259 mg/dL — ABNORMAL HIGH (ref 70–99)

## 2021-02-21 LAB — CBC
HCT: 32.7 % — ABNORMAL LOW (ref 36.0–46.0)
Hemoglobin: 11.3 g/dL — ABNORMAL LOW (ref 12.0–15.0)
MCH: 30.7 pg (ref 26.0–34.0)
MCHC: 34.6 g/dL (ref 30.0–36.0)
MCV: 88.9 fL (ref 80.0–100.0)
Platelets: 238 10*3/uL (ref 150–400)
RBC: 3.68 MIL/uL — ABNORMAL LOW (ref 3.87–5.11)
RDW: 13.2 % (ref 11.5–15.5)
WBC: 12 10*3/uL — ABNORMAL HIGH (ref 4.0–10.5)
nRBC: 0 % (ref 0.0–0.2)

## 2021-02-21 LAB — PROCALCITONIN: Procalcitonin: 7.27 ng/mL

## 2021-02-21 LAB — BASIC METABOLIC PANEL
Anion gap: 8 (ref 5–15)
BUN: 11 mg/dL (ref 6–20)
CO2: 23 mmol/L (ref 22–32)
Calcium: 8.1 mg/dL — ABNORMAL LOW (ref 8.9–10.3)
Chloride: 102 mmol/L (ref 98–111)
Creatinine, Ser: 0.52 mg/dL (ref 0.44–1.00)
GFR, Estimated: >60 mL/min (ref >60)
Glucose, Bld: 370 mg/dL — ABNORMAL HIGH (ref 70–99)
Potassium: 4 mmol/L (ref 3.5–5.1)
Sodium: 133 mmol/L — ABNORMAL LOW (ref 135–145)

## 2021-02-21 MED ORDER — SODIUM CHLORIDE 0.9 % IV SOLN
2.0000 g | INTRAVENOUS | Status: DC
  Administered 2021-02-22 – 2021-02-23 (×2): 2 g via INTRAVENOUS
  Filled 2021-02-21 (×2): qty 20

## 2021-02-21 MED ORDER — INSULIN ASPART PROT & ASPART (70-30 MIX) 100 UNIT/ML PEN
16.0000 [IU] | PEN_INJECTOR | Freq: Two times a day (BID) | SUBCUTANEOUS | 0 refills | Status: DC
  Filled 2021-02-21: qty 12, 30d supply, fill #0

## 2021-02-21 MED ORDER — INSULIN ASPART PROT & ASPART (70-30 MIX) 100 UNIT/ML ~~LOC~~ SUSP
16.0000 [IU] | Freq: Two times a day (BID) | SUBCUTANEOUS | Status: DC
  Administered 2021-02-21: 16 [IU] via SUBCUTANEOUS
  Filled 2021-02-21: qty 10

## 2021-02-21 MED ORDER — INSULIN PEN NEEDLE 32G X 4 MM MISC
0 refills | Status: DC
Start: 2021-02-21 — End: 2021-10-16
  Filled 2021-02-21: qty 100, 30d supply, fill #0

## 2021-02-21 MED ORDER — CEFDINIR 300 MG PO CAPS
300.0000 mg | ORAL_CAPSULE | Freq: Two times a day (BID) | ORAL | 0 refills | Status: DC
  Filled 2021-02-21: qty 14, 7d supply, fill #0

## 2021-02-21 NOTE — TOC Transition Note (Signed)
Transition of Care [facility]) - CM/SW Discharge Note   Patient Details  Name: Katie Simmons MRN: [id number] Date of Birth: 09-23-1975  [facility] CM/SW Contact:  Lockie Pares, RN Phone Number: 02/21/2021, 11:11 AM   Clinical Narrative:     Patient changed to po antibiotics. Sent scripts to [facility] pharmacy. Appointment made to establish PCP. MATCH done however patient has already utilized [facility] previously. Called for reactivation. Pharmacy aware.  Once done with meds TOC will sign off.  Final next level of care: Home/Self Care Barriers to Discharge: No Barriers Identified   Patient Goals and CMS Choice Patient states their goals for this hospitalization and ongoing recovery are:: return home      Discharge Placement           Home Self Care            Discharge Plan and Services                                     Social Determinants of Health (SDOH) Interventions     Readmission Risk Interventions No flowsheet data found.

## 2021-02-21 NOTE — Plan of Care (Signed)
°  RD consulted for nutrition education regarding diabetes. Spanish video interpreter was used to educate pt.   Lab Results  Component Value Date   HGBA1C 14.7 (H) 02/19/2021    RD provided "Carbohydrate Counting for People with Diabetes" and "Plate Method for Diabetics" handout from the Academy of Nutrition and Dietetics. Discussed different food groups and their effects on blood sugar, emphasizing carbohydrate-containing foods. Provided list of carbohydrates and recommended serving sizes of common foods.  Discussed importance of controlled and consistent carbohydrate intake throughout the day. Provided examples of ways to balance meals/snacks. Teach back method used.  Expect good compliance.  Current diet order is Carb Modified. No intakes recorded within EMR. Labs and medications reviewed. No further nutrition interventions warranted at this time. RD contact information provided. If additional nutrition issues arise, please re-consult RD.  Karie Mainland, RD, LDN Clinical Dietitian See [facility] for contact information.

## 2021-02-21 NOTE — Care Management (Signed)
Patient needs to be reinstated in [facility] to receive medications.

## 2021-02-21 NOTE — Discharge Planning (Signed)
RNCM reinstated pt in [facility] program as she has not utilized since 2019. Itai Barbian J. Lucretia Roers, RN, BSN, NCM  Transitions of Care   Nurse Case Manager  [facility]   Operative Services  [phone]

## 2021-02-21 NOTE — Progress Notes (Signed)
Inpatient Diabetes Program Recommendations  AACE/ADA: New Consensus Statement on Inpatient Glycemic Control (2015)  Target Ranges:  Prepandial:   less than 140 mg/dL      Peak postprandial:   less than 180 mg/dL (1-2 hours)      Critically ill patients:  140 - 180 mg/dL   Lab Results  Component Value Date   GLUCAP 324 (H) 02/21/2021   HGBA1C 14.7 (H) 02/19/2021    Review of Glycemic Control  Latest Reference Range & Units 02/20/21 16:04 02/20/21 19:57 02/21/21 08:02  Glucose-Capillary 70 - 99 mg/dL 268 (H) 233 (H) 324 (H)  (H): Data is abnormally high Diabetes history: Type 2 DM Outpatient Diabetes medications: Metformin 500 mg BID Current orders for Inpatient glycemic control: Novolog 0-15 units TID   Inpatient Diabetes Program Recommendations:     Consider increasing Novolog 70/30 15 units BID.   Thanks, Bronson Curb, MSN, RNC-OB Diabetes Coordinator [phone] (8a-5p)

## 2021-02-21 NOTE — Progress Notes (Signed)
PROGRESS NOTE    Sherol Sabas  DUK:[id number] DOB: 11-26-1975 DOA: 02/18/2021 PCP: Patient, No Pcp Per (Inactive)   Chief Complaint  Patient presents with   Abdominal Pain    Brief Narrative:    Witney Huie is a 46 y.o. female with medical history significant of hypertension, diabetes mellitus type 2, and pyelonephritis presents with complaints of abdominal pain.  Her work-up significant for sepsis due to right acute pyelonephritis, she remains septic despite being 4 days on oral ciprofloxacin, so she is admitted for further management, und noted cholelithiasis.  Assessment & Plan:   Principal Problem:   Pyelonephritis Active Problems:   Sepsis ([place])   Diabetes mellitus without complication (HCC)   Nausea vomiting and diarrhea   AKI (acute kidney injury) ([place])   Hypokalemia   Hyponatremia   Cholelithiasis   Transaminitis    Sepsis secondary to pyelonephritis:  -SIRS present on admission upon admission patient was noted to be tachycardic and tachypneic, with significant leukocytosis . -She failed outpatient Cipro treatment, so we will need to continue with IV antibiotics for another 24 to 48 hours, especially with poorly controlled diabetes mellitus, initially on cefepime, improving, will narrow to IV Rocephin 2 g daily, hopefully she can be transitioned to oral cefdinir in 48 hours if she is continuing to improve. -Continue with IV fluids   Nausea, vomiting, and diarrhea:  -Due to sepsis, resolved   Acute kidney injury: Patient presents with creatinine of 1.08 with BUN 21.  Baseline creatinine previously noted to be around 0.3- 0.5.  Suspect likely prerenal in nature given nausea and vomiting. -Proving with IV fluids  Diabetes mellitus type 2, uncontrolled: On admission glucose elevated to 342, CO2 19, and anion gap 11.  Home medication regimen includes metformin 500 mg twice daily. -Hypoglycemic protocols -Hold metformin -Continue with  insulin sliding scale, started on insulin 70/30 8 units twice daily, A1c is 14.5, poorly controlled, she was started on insulin 70/30, will go ahead and increase today given still having elevated readings, she will discharge home with all insulin, diabetic coordinator consulted, will consult nutritionist as well.   Hypokalemia: Acute.   -Repleted   Hyponatremia: Acute.  Sodium initially 126, and when corrected for hyperglycemia of 342 noted to be 132. -Improved with IV fluids   Cholelithiasis: Noted on CT scan of the abdomen noted cholelithiasis without signs of cholecystitis.  Ultrasound also noted cholelithiasis without signs of cholecystitis.   -LFTs trending down, sepsis related to pyelonephritis, and unlikely due to gallbladder infection   Transaminitis: Acute.   -Due to sepsis, resolved. -Negative hepatitis panel    DVT prophylaxis: Lovenox Code Status: Full Family Communication: None at bedside, she was seen today with the assistance of video interpreter  Disposition:   Status is: Inpatient  Remains inpatient appropriate because: need for IV ABX as she failed oral cipro X5 days       Consultants:  None   Subjective:  Patient reports she is feeling better today, flank pain has improved, she denies any fever or chills  Objective: Vitals:   02/21/21 0351 02/21/21 0408 02/21/21 0500 02/21/21 0801  BP: 138/86     Pulse: 81 72 65   Resp: '17 11 14   ' Temp: 98.9 F (37.2 C)   97.6 F (36.4 C)  TempSrc: Oral   Oral  SpO2: 96% 96% 97%   Weight:      Height:        Intake/Output Summary (Last 24 hours)  at 02/21/2021 1047 Last data filed at 02/20/2021 1756 Gross per 24 hour  Intake 1327.62 ml  Output --  Net 1327.62 ml   Filed Weights   02/18/21 2324  Weight: 52 kg    Examination:  Awake Alert, Oriented X 3, No new F.N deficits, Normal affect Symmetrical Chest wall movement, Good air movement bilaterally, CTAB RRR,No Gallops,Rubs or new Murmurs, No  Parasternal Heave No Cyanosis, Clubbing or edema, No new Rash or bruise   Minimal right CVA tenderness    Data Reviewed: I have personally reviewed following labs and imaging studies  CBC: Recent Labs  Lab 02/18/21 2327 02/20/21 0128 02/21/21 0049  WBC 18.3* 18.2* 12.0*  NEUTROABS 16.8*  --   --   HGB 13.2 10.7* 11.3*  HCT 38.1 32.2* 32.7*  MCV 86.6 89.0 88.9  PLT 250 220 762    Basic Metabolic Panel: Recent Labs  Lab 02/18/21 2327 02/19/21 1014 02/20/21 0128 02/21/21 0049  NA 126*  --  135 133*  K 3.2*  --  3.7 4.0  CL 96*  --  107 102  CO2 19*  --  20* 23  GLUCOSE 342*  --  250* 370*  BUN 21*  --  11 11  CREATININE 1.08*  --  0.55 0.52  CALCIUM 7.7*  --  7.8* 8.1*  MG  --  1.8  --   --     GFR: Estimated Creatinine Clearance: 59.7 mL/min (by C-G formula based on SCr of 0.52 mg/dL).  Liver Function Tests: Recent Labs  Lab 02/18/21 2327 02/20/21 0128  AST 117* 26  ALT 46* 30  ALKPHOS 146* 90  BILITOT 0.9 0.5  PROT 6.7 5.7*  ALBUMIN 2.8* 2.1*    CBG: Recent Labs  Lab 02/20/21 0749 02/20/21 1153 02/20/21 1604 02/20/21 1957 02/21/21 0802  GLUCAP 305* 251* 268* 233* 324*     Recent Results (from the past 240 hour(s))  Urine Culture     Status: Abnormal   Collection Time: 02/13/21  8:02 PM   Specimen: Urine, Clean Catch  Result Value Ref Range Status   Specimen Description URINE, CLEAN CATCH  Final   Special Requests   Final    NONE Performed at [facility], [place] [address], [place], [place] [address]    Culture 40,000 COLONIES/mL ESCHERICHIA COLI (A)  Final   Report Status 02/16/2021 FINAL  Final   Organism ID, Bacteria ESCHERICHIA COLI (A)  Final      Susceptibility   Escherichia coli - MIC*    AMPICILLIN >=32 RESISTANT Resistant     CEFAZOLIN <=4 SENSITIVE Sensitive     CEFEPIME <=0.12 SENSITIVE Sensitive     CEFTRIAXONE <=0.25 SENSITIVE Sensitive     CIPROFLOXACIN <=0.25 SENSITIVE Sensitive     GENTAMICIN <=1 SENSITIVE  Sensitive     IMIPENEM <=0.25 SENSITIVE Sensitive     NITROFURANTOIN <=16 SENSITIVE Sensitive     TRIMETH/SULFA <=20 SENSITIVE Sensitive     AMPICILLIN/SULBACTAM >=32 RESISTANT Resistant     PIP/TAZO <=4 SENSITIVE Sensitive     * 40,000 COLONIES/mL ESCHERICHIA COLI  Urine Culture     Status: Abnormal   Collection Time: 02/19/21  4:49 AM   Specimen: Urine, Clean Catch  Result Value Ref Range Status   Specimen Description URINE, CLEAN CATCH  Final   Special Requests NONE  Final   Culture (A)  Final    10,000 COLONIES/mL LACTOBACILLUS SPECIES Standardized susceptibility testing for this organism is not available. Performed at [facility], [place] [address], [place], [place] [address]    Report Status 02/20/2021 FINAL  Final  Resp Panel by RT-PCR (Flu A&B, Covid) Nasopharyngeal Swab     Status: None   Collection Time: 02/19/21  9:19 AM   Specimen: Nasopharyngeal Swab; Nasopharyngeal(NP) swabs in vial transport medium  Result Value Ref Range Status   SARS Coronavirus 2 by RT PCR NEGATIVE NEGATIVE Final    Comment: (NOTE) SARS-CoV-2 target nucleic acids are NOT DETECTED.  The SARS-CoV-2 RNA is generally detectable in upper respiratory specimens during the acute phase of infection. The lowest concentration of SARS-CoV-2 viral copies this assay can detect is 138 copies/mL. A negative result does not preclude SARS-Cov-2 infection and should not be used as the sole basis for treatment or other patient management decisions. A negative result may occur with  improper specimen collection/handling, submission of specimen other than nasopharyngeal swab, presence of viral mutation(s) within the areas targeted by this assay, and inadequate number of viral copies(<138 copies/mL). A negative result must be combined with clinical observations, patient history, and epidemiological information. The expected result is Negative.  Fact Sheet for Patients:   EntrepreneurPulse.com.au  Fact Sheet for Healthcare Providers:  IncredibleEmployment.be  This test is no t yet approved or cleared by the [place] FDA and  has been authorized for detection and/or diagnosis of SARS-CoV-2 by FDA under an Emergency Use Authorization (EUA). This EUA will remain  in effect (meaning this test can be used) for the duration of the COVID-19 declaration under Section 564(b)(1) of the Act, 21 U.S.C.section 360bbb-3(b)(1), unless the authorization is terminated  or revoked sooner.       Influenza A by PCR NEGATIVE NEGATIVE Final   Influenza B by PCR NEGATIVE NEGATIVE Final    Comment: (NOTE) The Xpert Xpress SARS-CoV-2/FLU/RSV plus assay is intended as an aid in the diagnosis of influenza from Nasopharyngeal swab specimens and should not be used as a sole basis for treatment. Nasal washings and aspirates are unacceptable for Xpert Xpress SARS-CoV-2/FLU/RSV testing.  Fact Sheet for Patients: EntrepreneurPulse.com.au  Fact Sheet for Healthcare Providers: IncredibleEmployment.be  This test is not yet approved or cleared by the [place] FDA and has been authorized for detection and/or diagnosis of SARS-CoV-2 by FDA under an Emergency Use Authorization (EUA). This EUA will remain in effect (meaning this test can be used) for the duration of the COVID-19 declaration under Section 564(b)(1) of the Act, 21 U.S.C. section 360bbb-3(b)(1), unless the authorization is terminated or revoked.  Performed at [facility], [place] [address], [place], [place] [address]          Radiology Studies: No results found.      Scheduled Meds:  enoxaparin (LOVENOX) injection  40 mg Subcutaneous Q24H   insulin aspart  0-15 Units Subcutaneous TID WC   insulin aspart protamine- aspart  16 Units Subcutaneous BID WC   insulin starter kit- pen needles  1 kit Other Once   living well  with diabetes book- in spanish   Does not apply Once   Continuous Infusions:  0.9 % NaCl with KCl 20 mEq / L 75 mL/hr at 02/20/21 2318   [START ON 02/22/2021] cefTRIAXone (ROCEPHIN)  IV       LOS: 2 days        Phillips Climes, MD [facility]   To contact the attending provider between 7A-7P or the covering provider during after hours 7P-7A, please log into the web site [email or web address] and access using  universal [place] password for that web site. If you do not have the password, please call the hospital operator.  02/21/2021, 10:47 AM   Patient ID: Mariel Aloe, female   DOB: 10/30/1975, 46 y.o.   MRN: [id number] Patient ID: Cyndel Griffey Maurice March, female   DOB: [birth year], 46 y.o.   MRN: [id number]

## 2021-02-22 LAB — CBC
HCT: 36.5 % (ref 36.0–46.0)
Hemoglobin: 12.5 g/dL (ref 12.0–15.0)
MCH: 30 pg (ref 26.0–34.0)
MCHC: 34.2 g/dL (ref 30.0–36.0)
MCV: 87.7 fL (ref 80.0–100.0)
Platelets: 306 10*3/uL (ref 150–400)
RBC: 4.16 MIL/uL (ref 3.87–5.11)
RDW: 12.9 % (ref 11.5–15.5)
WBC: 7.8 10*3/uL (ref 4.0–10.5)
nRBC: 0 % (ref 0.0–0.2)

## 2021-02-22 LAB — GLUCOSE, CAPILLARY
Glucose-Capillary: 215 mg/dL — ABNORMAL HIGH (ref 70–99)
Glucose-Capillary: 236 mg/dL — ABNORMAL HIGH (ref 70–99)
Glucose-Capillary: 225 mg/dL — ABNORMAL HIGH (ref 70–99)
Glucose-Capillary: 117 mg/dL — ABNORMAL HIGH (ref 70–99)

## 2021-02-22 LAB — BASIC METABOLIC PANEL
Anion gap: 8 (ref 5–15)
BUN: 14 mg/dL (ref 6–20)
CO2: 26 mmol/L (ref 22–32)
Calcium: 8.8 mg/dL — ABNORMAL LOW (ref 8.9–10.3)
Chloride: 101 mmol/L (ref 98–111)
Creatinine, Ser: 0.55 mg/dL (ref 0.44–1.00)
GFR, Estimated: >60 mL/min (ref >60)
Glucose, Bld: 273 mg/dL — ABNORMAL HIGH (ref 70–99)
Potassium: 4.2 mmol/L (ref 3.5–5.1)
Sodium: 135 mmol/L (ref 135–145)

## 2021-02-22 LAB — PROCALCITONIN: Procalcitonin: 4.13 ng/mL

## 2021-02-22 MED ORDER — SODIUM CHLORIDE 0.9 % IV SOLN: INTRAVENOUS | Status: DC

## 2021-02-22 MED ORDER — INSULIN ASPART PROT & ASPART (70-30 MIX) 100 UNIT/ML ~~LOC~~ SUSP
20.0000 [IU] | Freq: Two times a day (BID) | SUBCUTANEOUS | Status: DC
  Administered 2021-02-22 – 2021-02-23 (×3): 20 [IU] via SUBCUTANEOUS

## 2021-02-22 NOTE — Progress Notes (Signed)
PROGRESS NOTE    Katie Simmons  ZHY:[id number] DOB: [birth year] DOA: 02/18/2021 PCP: Patient, No Pcp Per (Inactive)   Chief Complaint  Patient presents with   Abdominal Pain    Brief Narrative:    Katie Simmons is a 46 y.o. female with medical history significant of hypertension, diabetes mellitus type 2, and pyelonephritis presents with complaints of abdominal pain.  Her work-up significant for sepsis due to right acute pyelonephritis, she remains septic despite being 4 days on oral ciprofloxacin, so she is admitted for further management, and noted cholelithiasis.  Assessment & Plan:   Principal Problem:   Pyelonephritis Active Problems:   Sepsis (HCC)   Diabetes mellitus without complication (HCC)   Nausea vomiting and diarrhea   AKI (acute kidney injury) (HCC)   Hypokalemia   Hyponatremia   Cholelithiasis   Transaminitis    Sepsis secondary to pyelonephritis:  -SIRS present on admission upon admission patient was noted to be tachycardic and tachypneic, with significant leukocytosis . -She failed outpatient Cipro treatment, so we will need to continue with IV antibiotics for another 24 to 48 hours, especially with poorly controlled diabetes mellitus, initially on cefepime, improving, will narrow to IV Rocephin 2 g daily, continue with IV antibiotics for next 24 hours, and will switch her to cefdinir tomorrow for discharge.   -Continue with IV fluids -Procalcitonin trending down which is reassuring.   Nausea, vomiting, and diarrhea:  -Due to sepsis, resolved   Acute kidney injury: Patient presents with creatinine of 1.08 with BUN 21.  Baseline creatinine previously noted to be around 0.3- 0.5.  Suspect likely prerenal in nature given nausea and vomiting. -resolved  Diabetes mellitus type 2, uncontrolled: On admission glucose elevated to 342, CO2 19, and anion gap 11.  Home medication regimen includes metformin 500 mg twice daily. -Hypoglycemic  protocols -Hold metformin -Continue with insulin sliding scale, started on insulin 70/30 8 units twice daily, A1c is 14.5, poorly controlled, she was started on insulin 70/30, mains poorly controlled, I will increase her dose to 20 units twice daily.  -Better coordinator and nutritionist consulted.    Hypokalemia: Acute.   -Repleted   Hyponatremia: Acute.  Sodium initially 126, and when corrected for hyperglycemia of 342 noted to be 132. -Improved with IV fluids   Cholelithiasis: Noted on CT scan of the abdomen noted cholelithiasis without signs of cholecystitis.  Ultrasound also noted cholelithiasis without signs of cholecystitis.   -LFTs trending down, sepsis related to pyelonephritis, and unlikely due to gallbladder infection   Transaminitis: Acute.   -Due to sepsis, resolved. -Negative hepatitis panel    DVT prophylaxis: Lovenox Code Status: Full Family Communication: None at bedside, she was seen today with the assistance of video interpreter  Disposition:   Status is: Inpatient  Remains inpatient appropriate because: need for IV ABX as she failed oral cipro X5 days       Consultants:  None   Subjective:  Did report some headache and dizziness earlier today, she had 1 reading of soft blood pressure, this has resolved after receiving IV fluid, she was ambulated by staff in the hallway with no recurrent symptoms.  Objective: Vitals:   02/22/21 1356 02/22/21 1357 02/22/21 1358 02/22/21 1359  BP:      Pulse: 80 79 81 79  Resp: 18 17 17 16   Temp:      TempSrc:      SpO2: 97% 97% 98% 97%  Weight:      Height:  No intake or output data in the 24 hours ending 02/22/21 1409  Filed Weights   02/18/21 2324  Weight: 52 kg    Examination:  Awake Alert, Oriented X 3, No new F.N deficits, Normal affect Symmetrical Chest wall movement, Good air movement bilaterally, CTAB RRR,No Gallops,Rubs or new Murmurs, No Parasternal Heave +ve B.Sounds, Abd Soft, No  tenderness, No rebound - guarding or rigidity. No Cyanosis, Clubbing or edema, No new Rash or bruise      Data Reviewed: I have personally reviewed following labs and imaging studies  CBC: Recent Labs  Lab 02/18/21 2327 02/20/21 0128 02/21/21 0049 02/22/21 0126  WBC 18.3* 18.2* 12.0* 7.8  NEUTROABS 16.8*  --   --   --   HGB 13.2 10.7* 11.3* 12.5  HCT 38.1 32.2* 32.7* 36.5  MCV 86.6 89.0 88.9 87.7  PLT 250 220 238 306    Basic Metabolic Panel: Recent Labs  Lab 02/18/21 2327 02/19/21 1014 02/20/21 0128 02/21/21 0049 02/22/21 0126  NA 126*  --  135 133* 135  K 3.2*  --  3.7 4.0 4.2  CL 96*  --  107 102 101  CO2 19*  --  20* 23 26  GLUCOSE 342*  --  250* 370* 273*  BUN 21*  --  11 11 14   CREATININE 1.08*  --  0.55 0.52 0.55  CALCIUM 7.7*  --  7.8* 8.1* 8.8*  MG  --  1.8  --   --   --     GFR: Estimated Creatinine Clearance: 59.7 mL/min (by C-G formula based on SCr of 0.55 mg/dL).  Liver Function Tests: Recent Labs  Lab 02/18/21 2327 02/20/21 0128  AST 117* 26  ALT 46* 30  ALKPHOS 146* 90  BILITOT 0.9 0.5  PROT 6.7 5.7*  ALBUMIN 2.8* 2.1*    CBG: Recent Labs  Lab 02/21/21 1209 02/21/21 1628 02/21/21 2043 02/22/21 0812 02/22/21 1212  GLUCAP 240* 153* 259* 215* 236*     Recent Results (from the past 240 hour(s))  Urine Culture     Status: Abnormal   Collection Time: 02/13/21  8:02 PM   Specimen: Urine, Clean Catch  Result Value Ref Range Status   Specimen Description URINE, CLEAN CATCH  Final   Special Requests   Final    NONE Performed at [facility], [address] [place], [place]    Culture 40,000 COLONIES/mL ESCHERICHIA COLI (A)  Final   Report Status 02/16/2021 FINAL  Final   Organism ID, Bacteria ESCHERICHIA COLI (A)  Final      Susceptibility   Escherichia coli - MIC*    AMPICILLIN >=32 RESISTANT Resistant     CEFAZOLIN <=4 SENSITIVE Sensitive     CEFEPIME <=0.12 SENSITIVE Sensitive     CEFTRIAXONE <=0.25  SENSITIVE Sensitive     CIPROFLOXACIN <=0.25 SENSITIVE Sensitive     GENTAMICIN <=1 SENSITIVE Sensitive     IMIPENEM <=0.25 SENSITIVE Sensitive     NITROFURANTOIN <=16 SENSITIVE Sensitive     TRIMETH/SULFA <=20 SENSITIVE Sensitive     AMPICILLIN/SULBACTAM >=32 RESISTANT Resistant     PIP/TAZO <=4 SENSITIVE Sensitive     * 40,000 COLONIES/mL ESCHERICHIA COLI  Urine Culture     Status: Abnormal   Collection Time: 02/19/21  4:49 AM   Specimen: Urine, Clean Catch  Result Value Ref Range Status   Specimen Description URINE, CLEAN CATCH  Final   Special Requests NONE  Final   Culture (A)  Final  10,000 COLONIES/mL LACTOBACILLUS SPECIES Standardized susceptibility testing for this organism is not available. Performed at [facility], [address] [place], [place] [address]    Report Status 02/20/2021 FINAL  Final  Resp Panel by RT-PCR (Flu A&B, Covid) Nasopharyngeal Swab     Status: None   Collection Time: 02/19/21  9:19 AM   Specimen: Nasopharyngeal Swab; Nasopharyngeal(NP) swabs in vial transport medium  Result Value Ref Range Status   SARS Coronavirus 2 by RT PCR NEGATIVE NEGATIVE Final    Comment: (NOTE) SARS-CoV-2 target nucleic acids are NOT DETECTED.  The SARS-CoV-2 RNA is generally detectable in upper respiratory specimens during the acute phase of infection. The lowest concentration of SARS-CoV-2 viral copies this assay can detect is 138 copies/mL. A negative result does not preclude SARS-Cov-2 infection and should not be used as the sole basis for treatment or other patient management decisions. A negative result may occur with  improper specimen collection/handling, submission of specimen other than nasopharyngeal swab, presence of viral mutation(s) within the areas targeted by this assay, and inadequate number of viral copies(<138 copies/mL). A negative result must be combined with clinical observations, patient history, and epidemiological information. The  expected result is Negative.  Fact Sheet for Patients:  BloggerCourse.comhttps://www.fda.gov/media/152166/download  Fact Sheet for Healthcare Providers:  SeriousBroker.ithttps://www.fda.gov/media/152162/download  This test is no t yet approved or cleared by the [place] FDA and  has been authorized for detection and/or diagnosis of SARS-CoV-2 by FDA under an Emergency Use Authorization (EUA). This EUA will remain  in effect (meaning this test can be used) for the duration of the COVID-19 declaration under Section 564(b)(1) of the Act, 21 U.S.C.section 360bbb-3(b)(1), unless the authorization is terminated  or revoked sooner.       Influenza A by PCR NEGATIVE NEGATIVE Final   Influenza B by PCR NEGATIVE NEGATIVE Final    Comment: (NOTE) The Xpert Xpress SARS-CoV-2/FLU/RSV plus assay is intended as an aid in the diagnosis of influenza from Nasopharyngeal swab specimens and should not be used as a sole basis for treatment. Nasal washings and aspirates are unacceptable for Xpert Xpress SARS-CoV-2/FLU/RSV testing.  Fact Sheet for Patients: BloggerCourse.comhttps://www.fda.gov/media/152166/download  Fact Sheet for Healthcare Providers: SeriousBroker.ithttps://www.fda.gov/media/152162/download  This test is not yet approved or cleared by the [place] FDA and has been authorized for detection and/or diagnosis of SARS-CoV-2 by FDA under an Emergency Use Authorization (EUA). This EUA will remain in effect (meaning this test can be used) for the duration of the COVID-19 declaration under Section 564(b)(1) of the Act, 21 U.S.C. section 360bbb-3(b)(1), unless the authorization is terminated or revoked.  Performed at [facility], [address] [place], [place] [address]          Radiology Studies: No results found.      Scheduled Meds:  enoxaparin (LOVENOX) injection  40 mg Subcutaneous Q24H   insulin aspart  0-15 Units Subcutaneous TID WC   insulin aspart protamine- aspart  20 Units Subcutaneous BID WC    Continuous Infusions:  cefTRIAXone (ROCEPHIN)  IV 2 g (02/22/21 0856)     LOS: 3 days        Huey Bienenstockawood Galadriel Shroff, MD [facility]   To contact the attending provider between 7A-7P or the covering provider during after hours 7P-7A, please log into the web site [email or web address] and access using universal  password for that web site. If you do not have the password, please call the hospital operator.  02/22/2021, 2:09 PM   Patient ID:  [facility] Mickle Mallory, female   DOB: 06-18-1975, 46 y.o.   MRN: [id number] Patient ID: Michaelah Credeur Mickle Mallory, female   DOB: 1975-10-25, 46 y.o.   MRN: [id number] Patient ID: Haley Fuerstenberg Mickle Mallory, female   DOB: Oct 14, 1975, 46 y.o.   MRN: [id number]

## 2021-02-23 LAB — GLUCOSE, CAPILLARY: Glucose-Capillary: 238 mg/dL — ABNORMAL HIGH (ref 70–99)

## 2021-02-23 NOTE — Discharge Instructions (Signed)
Follow with Primary MD   Get CBC, CMP,  checked  by Primary MD next visit.    Activity: As tolerated with Full fall precautions use walker/cane & assistance as needed   Disposition Home    Diet: Carb modified   On your next visit with your primary care physician please Get Medicines reviewed and adjusted.   Please request your Prim.MD to go over all Hospital Tests and Procedure/Radiological results at the follow up, please get all Hospital records sent to your Prim MD by signing hospital release before you go home.   If you experience worsening of your admission symptoms, develop shortness of breath, life threatening emergency, suicidal or homicidal thoughts you must seek medical attention immediately by calling 911 or calling your MD immediately  if symptoms less severe.  You Must read complete instructions/literature along with all the possible adverse reactions/side effects for all the Medicines you take and that have been prescribed to you. Take any new Medicines after you have completely understood and accpet all the possible adverse reactions/side effects.   Do not drive when taking Pain medications.    Do not take more than prescribed Pain, Sleep and Anxiety Medications  Special Instructions: If you have smoked or chewed Tobacco  in the last 2 yrs please stop smoking, stop any regular Alcohol  and or any Recreational drug use.  Wear Seat belts while driving.   Please note  You were cared for by a hospitalist during your hospital stay. If you have any questions about your discharge medications or the care you received while you were in the hospital after you are discharged, you can call the unit and asked to speak with the hospitalist on call if the hospitalist that took care of you is not available. Once you are discharged, your primary care physician will handle any further medical issues. Please note that NO REFILLS for any discharge medications will be authorized once  you are discharged, as it is imperative that you return to your primary care physician (or establish a relationship with a primary care physician if you do not have one) for your aftercare needs so that they can reassess your need for medications and monitor your lab values.

## 2021-02-23 NOTE — Plan of Care (Signed)
Problem: Education: Goal: Knowledge of General Education information will improve Description: Including pain rating scale, medication(s)/side effects and non-pharmacologic comfort measures Outcome: Completed/Met   Problem: Health Behavior/Discharge Planning: Goal: Ability to manage health-related needs will improve Outcome: Completed/Met   Problem: Clinical Measurements: Goal: Ability to maintain clinical measurements within normal limits will improve Outcome: Completed/Met Goal: Will remain free from infection Outcome: Completed/Met Goal: Diagnostic test results will improve Outcome: Completed/Met Goal: Respiratory complications will improve Outcome: Completed/Met Goal: Cardiovascular complication will be avoided Outcome: Completed/Met   Problem: Activity: Goal: Risk for activity intolerance will decrease Outcome: Completed/Met   Problem: Nutrition: Goal: Adequate nutrition will be maintained Outcome: Completed/Met   Problem: Coping: Goal: Level of anxiety will decrease Outcome: Completed/Met   Problem: Elimination: Goal: Will not experience complications related to bowel motility Outcome: Completed/Met Goal: Will not experience complications related to urinary retention Outcome: Completed/Met   Problem: Pain Managment: Goal: General experience of comfort will improve Outcome: Completed/Met   Problem: Safety: Goal: Ability to remain free from injury will improve Outcome: Completed/Met   Problem: Skin Integrity: Goal: Risk for impaired skin integrity will decrease Outcome: Completed/Met   Problem: Urinary Elimination: Goal: Signs and symptoms of infection will decrease Outcome: Completed/Met   Problem: Education: Goal: Ability to describe self-care measures that may prevent or decrease complications (Diabetes Survival Skills Education) will improve Outcome: Completed/Met Goal: Individualized Educational Video(s) Outcome: Completed/Met   Problem:  Coping: Goal: Ability to adjust to condition or change in health will improve Outcome: Completed/Met   Problem: Fluid Volume: Goal: Ability to maintain a balanced intake and output will improve Outcome: Completed/Met   Problem: Health Behavior/Discharge Planning: Goal: Ability to identify and utilize available resources and services will improve Outcome: Completed/Met Goal: Ability to manage health-related needs will improve Outcome: Completed/Met   Problem: Metabolic: Goal: Ability to maintain appropriate glucose levels will improve Outcome: Completed/Met   Problem: Nutritional: Goal: Maintenance of adequate nutrition will improve Outcome: Completed/Met Goal: Progress toward achieving an optimal weight will improve Outcome: Completed/Met   Problem: Skin Integrity: Goal: Risk for impaired skin integrity will decrease Outcome: Completed/Met   Problem: Tissue Perfusion: Goal: Adequacy of tissue perfusion will improve Outcome: Completed/Met

## 2021-02-24 NOTE — Discharge Summary (Signed)
Physician Discharge Summary  Katie Simmons Truth Wolaver WUG:[id number] DOB: 1976-02-02 DOA: 02/18/2021  PCP: Patient, No Pcp Per (Inactive)  Admit date: 02/18/2021 Discharge date: 02/24/2021  Admitted From:Home Disposition:  Home  Recommendations for Outpatient Follow-up:  Follow up with PCP , and has been scheduled for 1/24 Adjust insulin as needed   Discharge Condition:Stable CODE STATUS:FULL Diet recommendation: Heart Healthy / Carb Modified   Brief/Interim Summary:    Sepsis secondary to pyelonephritis:  -SIRS present on admission upon admission patient was noted to be tachycardic and tachypneic, with significant leukocytosis . -She failed outpatient Cipro treatment, so she was admitted for IV antibiotic treatment, initially on cefepime, then narrowed to Rocephin, this has resolved, no CVA tenderness, switch to cefdinir on discharge to finish another 7 days of oral cefdinir on discharge. -She was giving the oral antibiotic from our [place] at [facility]  Nausea, vomiting, and diarrhea:  -Due to sepsis, resolved   Acute kidney injury: Patient presents with creatinine of 1.08 with BUN 21.  Baseline creatinine previously noted to be around 0.3- 0.5.  Suspect likely prerenal in nature given nausea and vomiting. -resolved   Diabetes mellitus type 2, uncontrolled: On admission glucose elevated to 342, CO2 19, and anion gap 11.  Home medication regimen includes metformin 500 mg twice daily. -A1c is 14.5, poorly controlled, was started on insulin 70/30, she was seen by diabetic coordinator, and she will be discharged on insulin 70/30, metformin has been discontinued, she was counseled by diabetic coordinator and nutritionist,  -She was given the insulin from our [place] at [facility]    Hypokalemia: Acute.   -Repleted   Hyponatremia: Acute.  Sodium initially 126, and when corrected for hyperglycemia of 342 noted to be 132. -Resolved with IV fluids   Cholelithiasis:  Noted on CT scan of the abdomen noted cholelithiasis without signs of cholecystitis.  Ultrasound also noted cholelithiasis without signs of cholecystitis.   -LFTs trending down, sepsis related to pyelonephritis, and unlikely due to gallbladder infection   Transaminitis: Acute.   -Due to sepsis, resolved. -Negative hepatitis panel    Discharge Diagnoses:  Principal Problem:   Pyelonephritis Active Problems:   Sepsis ([place])   Diabetes mellitus without complication (HCC)   Nausea vomiting and diarrhea   AKI (acute kidney injury) ([place])   Hypokalemia   Hyponatremia   Cholelithiasis   Transaminitis    Discharge Instructions  Discharge Instructions     Diet - low sodium heart healthy   Complete by: As directed    Discharge instructions   Complete by: As directed    Follow with Primary MD   Get CBC, CMP,  checked  by Primary MD next visit.    Activity: As tolerated with Full fall precautions use walker/cane & assistance as needed   Disposition Home    Diet: Carb modified   On your next visit with your primary care physician please Get Medicines reviewed and adjusted.   Please request your Prim.MD to go over all Hospital Tests and Procedure/Radiological results at the follow up, please get all Hospital records sent to your Prim MD by signing hospital release before you go home.   If you experience worsening of your admission symptoms, develop shortness of breath, life threatening emergency, suicidal or homicidal thoughts you must seek medical attention immediately by calling 911 or calling your MD immediately  if symptoms less severe.  You Must read complete instructions/literature along with all the possible adverse reactions/side effects for all the  Medicines you take and that have been prescribed to you. Take any new Medicines after you have completely understood and accpet all the possible adverse reactions/side effects.   Do not drive when taking Pain medications.     Do not take more than prescribed Pain, Sleep and Anxiety Medications  Special Instructions: If you have smoked or chewed Tobacco  in the last 2 yrs please stop smoking, stop any regular Alcohol  and or any Recreational drug use.  Wear Seat belts while driving.   Please note  You were cared for by a hospitalist during your hospital stay. If you have any questions about your discharge medications or the care you received while you were in the hospital after you are discharged, you can call the unit and asked to speak with the hospitalist on call if the hospitalist that took care of you is not available. Once you are discharged, your primary care physician will handle any further medical issues. Please note that NO REFILLS for any discharge medications will be authorized once you are discharged, as it is imperative that you return to your primary care physician (or establish a relationship with a primary care physician if you do not have one) for your aftercare needs so that they can reassess your need for medications and monitor your lab values.   Increase activity slowly   Complete by: As directed       Allergies as of 02/23/2021       Reactions   Penicillins Itching   Has patient had a PCN reaction causing immediate rash, facial/tongue/throat swelling, SOB or lightheadedness with hypotension: No Has patient had a PCN reaction causing severe rash involving mucus membranes or skin necrosis: No Has patient had a PCN reaction that required hospitalization: No Has patient had a PCN reaction occurring within the last 10 years: Yes If all of the above answers are "NO", then may proceed with Cephalosporin use.        Medication List     STOP taking these medications    ibuprofen 200 MG tablet Commonly known as: ADVIL   naproxen 500 MG tablet Commonly known as: NAPROSYN       TAKE these medications    acetaminophen 500 MG tablet Commonly known as: TYLENOL Take 500 mg by  mouth every 6 (six) hours as needed for moderate pain or headache.   cefdinir 300 MG capsule Commonly known as: OMNICEF Take 1 capsule (300 mg total) by mouth 2 (two) times daily.   ciprofloxacin 500 MG tablet Commonly known as: CIPRO Take 1 tablet (500 mg total) by mouth every 12 (twelve) hours. What changed:  when to take this additional instructions   glucose blood test strip Commonly known as: True Metrix Blood Glucose Test Use as instructed   metFORMIN 500 MG tablet Commonly known as: GLUCOPHAGE Take 1 tablet (500 mg total) by mouth 2 (two) times daily with a meal.   NovoLOG Mix 70/30 FlexPen (70-30) 100 UNIT/ML FlexPen Generic drug: insulin aspart protamine - aspart Inject 16 Units into the skin 2 (two) times daily with a meal.   Pentips 32G X 4 MM Misc Generic drug: Insulin Pen Needle use as directed up to four times daily   True Metrix Meter w/Device Kit 1 each by Does not apply route 3 (three) times daily.   TRUEplus Lancets 28G Misc 1 each by Does not apply route 3 (three) times daily.        Follow-up Information  Kerin Perna, NP Follow up.   Specialty: Internal Medicine Why: TIME: 2:30 PM DATE: JANUARY 24 , 2023 DOCTOR: EDWARDS, MICHELLE Contact information: [address] [place] [address] [phone]                Allergies  Allergen Reactions   Penicillins Itching    Has patient had a PCN reaction causing immediate rash, facial/tongue/throat swelling, SOB or lightheadedness with hypotension: No Has patient had a PCN reaction causing severe rash involving mucus membranes or skin necrosis: No Has patient had a PCN reaction that required hospitalization: No Has patient had a PCN reaction occurring within the last 10 years: Yes If all of the above answers are "NO", then may proceed with Cephalosporin use.    Consultations: none   Procedures/Studies: CT ABDOMEN PELVIS W CONTRAST  Result Date: 02/19/2021 CLINICAL  DATA:  Abdominal pain. EXAM: CT ABDOMEN AND PELVIS WITH CONTRAST TECHNIQUE: Multidetector CT imaging of the abdomen and pelvis was performed using the standard protocol following bolus administration of intravenous contrast. CONTRAST:  6m OMNIPAQUE IOHEXOL 300 MG/ML  SOLN COMPARISON:  CT abdomen pelvis dated 01/06/2018. FINDINGS: Lower chest: The visualized lung bases are clear. Calcified granuloma at the right lung base. No intra-abdominal free air or free fluid. Hepatobiliary: The liver is unremarkable. No intrahepatic biliary dilatation. There are multiple stones within the gallbladder. There is mild stranding of the fat plane in the right upper quadrant. This is likely secondary to right-sided pyelonephritis. Ultrasound may provide better evaluation of the gallbladder if there is clinical concern for acute cholecystitis. Pancreas: Unremarkable. No pancreatic ductal dilatation or surrounding inflammatory changes. Spleen: Normal in size without focal abnormality. Adrenals/Urinary Tract: The adrenal glands unremarkable there is faint haziness of the right renal parenchyma with heterogeneous enhancement consistent with pyelonephritis. There is enhancement of the urothelium of the right ureter. No drainable fluid collection or abscess. The urinary bladder is grossly unremarkable. Stomach/Bowel: There is no bowel obstruction or active inflammation. The appendix is normal. Vascular/Lymphatic: The abdominal aorta and IVC unremarkable. No portal venous gas. There is no adenopathy. Reproductive: The uterus is grossly unremarkable. There is a 3.4 x 3.3 cm posterior uterine fibroid. No adnexal masses. Other: None Musculoskeletal: No acute or significant osseous findings. IMPRESSION: 1. Right-sided pyelonephritis. No drainable fluid collection or abscess. 2. Cholelithiasis. 3. No bowel obstruction. Normal appendix. 4. Uterine fibroid. Electronically Signed   By: AAnner CreteM.D.   On: 02/19/2021 01:19   UKoreaAbdomen  Limited RUQ (LIVER/GB)  Result Date: 02/19/2021 CLINICAL DATA:  Cholelithiasis EXAM: ULTRASOUND ABDOMEN LIMITED RIGHT UPPER QUADRANT COMPARISON:  Abdominal CT from earlier today FINDINGS: Gallbladder: Shadowing gallstones. No gallbladder wall thickening or over distention. There is reported focal tenderness and mild pericholecystic fluid. Common bile duct: Diameter: 4 mm. Liver: No focal lesion identified. Within normal limits in parenchymal echogenicity. Portal vein is patent on color Doppler imaging with normal direction of blood flow towards the liver. Other: Right pyelonephritis by prior CT. IMPRESSION: 1. Cholelithiasis and right upper quadrant tenderness but no gallbladder over distension or wall thickening typical of acute cholecystitis. 2. Right pyelonephritis by prior CT. Electronically Signed   By: JJorje GuildM.D.   On: 02/19/2021 04:28      Subjective: Patient denies any chest pain, shortness of breath, right flank pain has resolved  Discharge Exam: Vitals:   02/23/21 0500 02/23/21 0844  BP:  (!) 154/93  Pulse: 76 83  Resp: 14 17  Temp:    SpO2: 96%  96%   Vitals:   02/23/21 0302 02/23/21 0400 02/23/21 0500 02/23/21 0844  BP: 102/69 96/66  (!) 154/93  Pulse: 76 68 76 83  Resp: '17 12 14 17  ' Temp: 97.6 F (36.4 C)     TempSrc: Oral     SpO2: 97% 96% 96% 96%  Weight:      Height:        General: Pt is alert, awake, not in acute distress Cardiovascular: RRR, S1/S2 +, no rubs, no gallops Respiratory: CTA bilaterally, no wheezing, no rhonchi Abdominal: Soft, NT, ND, bowel sounds + Extremities: no edema, no cyanosis  Video interpreter has been used.    The results of significant diagnostics from this hospitalization (including imaging, microbiology, ancillary and laboratory) are listed below for reference.     Microbiology: Recent Results (from the past 240 hour(s))  Urine Culture     Status: Abnormal   Collection Time: 02/19/21  4:49 AM   Specimen: Urine,  Clean Catch  Result Value Ref Range Status   Specimen Description URINE, CLEAN CATCH  Final   Special Requests NONE  Final   Culture (A)  Final    10,000 COLONIES/mL LACTOBACILLUS SPECIES Standardized susceptibility testing for this organism is not available. Performed at [facility], [place] [address], [place], [place] [address]    Report Status 02/20/2021 FINAL  Final  Resp Panel by RT-PCR (Flu A&B, Covid) Nasopharyngeal Swab     Status: None   Collection Time: 02/19/21  9:19 AM   Specimen: Nasopharyngeal Swab; Nasopharyngeal(NP) swabs in vial transport medium  Result Value Ref Range Status   SARS Coronavirus 2 by RT PCR NEGATIVE NEGATIVE Final    Comment: (NOTE) SARS-CoV-2 target nucleic acids are NOT DETECTED.  The SARS-CoV-2 RNA is generally detectable in upper respiratory specimens during the acute phase of infection. The lowest concentration of SARS-CoV-2 viral copies this assay can detect is 138 copies/mL. A negative result does not preclude SARS-Cov-2 infection and should not be used as the sole basis for treatment or other patient management decisions. A negative result may occur with  improper specimen collection/handling, submission of specimen other than nasopharyngeal swab, presence of viral mutation(s) within the areas targeted by this assay, and inadequate number of viral copies(<138 copies/mL). A negative result must be combined with clinical observations, patient history, and epidemiological information. The expected result is Negative.  Fact Sheet for Patients:  EntrepreneurPulse.com.au  Fact Sheet for Healthcare Providers:  IncredibleEmployment.be  This test is no t yet approved or cleared by the [place] FDA and  has been authorized for detection and/or diagnosis of SARS-CoV-2 by FDA under an Emergency Use Authorization (EUA). This EUA will remain  in effect (meaning this test can be used) for the duration of  the COVID-19 declaration under Section 564(b)(1) of the Act, 21 U.S.C.section 360bbb-3(b)(1), unless the authorization is terminated  or revoked sooner.       Influenza A by PCR NEGATIVE NEGATIVE Final   Influenza B by PCR NEGATIVE NEGATIVE Final    Comment: (NOTE) The Xpert Xpress SARS-CoV-2/FLU/RSV plus assay is intended as an aid in the diagnosis of influenza from Nasopharyngeal swab specimens and should not be used as a sole basis for treatment. Nasal washings and aspirates are unacceptable for Xpert Xpress SARS-CoV-2/FLU/RSV testing.  Fact Sheet for Patients: EntrepreneurPulse.com.au  Fact Sheet for Healthcare Providers: IncredibleEmployment.be  This test is not yet approved or cleared by the [place] FDA and has been authorized for detection and/or diagnosis  of SARS-CoV-2 by FDA under an Emergency Use Authorization (EUA). This EUA will remain in effect (meaning this test can be used) for the duration of the COVID-19 declaration under Section 564(b)(1) of the Act, 21 U.S.C. section 360bbb-3(b)(1), unless the authorization is terminated or revoked.  Performed at [facility], [place] [address] [place], [place] [address]      Labs: BNP (last 3 results) No results for input(s): BNP in the last 8760 hours. Basic Metabolic Panel: Recent Labs  Lab 02/18/21 2327 02/19/21 1014 02/20/21 0128 02/21/21 0049 02/22/21 0126  NA 126*  --  135 133* 135  K 3.2*  --  3.7 4.0 4.2  CL 96*  --  107 102 101  CO2 19*  --  20* 23 26  GLUCOSE 342*  --  250* 370* 273*  BUN 21*  --  '11 11 14  ' CREATININE 1.08*  --  0.55 0.52 0.55  CALCIUM 7.7*  --  7.8* 8.1* 8.8*  MG  --  1.8  --   --   --    Liver Function Tests: Recent Labs  Lab 02/18/21 2327 02/20/21 0128  AST 117* 26  ALT 46* 30  ALKPHOS 146* 90  BILITOT 0.9 0.5  PROT 6.7 5.7*  ALBUMIN 2.8* 2.1*   Recent Labs  Lab 02/18/21 2327  LIPASE 27   No results for input(s):  AMMONIA in the last 168 hours. CBC: Recent Labs  Lab 02/18/21 2327 02/20/21 0128 02/21/21 0049 02/22/21 0126  WBC 18.3* 18.2* 12.0* 7.8  NEUTROABS 16.8*  --   --   --   HGB 13.2 10.7* 11.3* 12.5  HCT 38.1 32.2* 32.7* 36.5  MCV 86.6 89.0 88.9 87.7  PLT 250 220 238 306   Cardiac Enzymes: No results for input(s): CKTOTAL, CKMB, CKMBINDEX, TROPONINI in the last 168 hours. BNP: Invalid input(s): POCBNP CBG: Recent Labs  Lab 02/22/21 0812 02/22/21 1212 02/22/21 1639 02/22/21 1950 02/23/21 0846  GLUCAP 215* 236* 225* 117* 238*   D-Dimer No results for input(s): DDIMER in the last 72 hours. Hgb A1c No results for input(s): HGBA1C in the last 72 hours. Lipid Profile No results for input(s): CHOL, HDL, LDLCALC, TRIG, CHOLHDL, LDLDIRECT in the last 72 hours. Thyroid function studies No results for input(s): TSH, T4TOTAL, T3FREE, THYROIDAB in the last 72 hours.  Invalid input(s): FREET3 Anemia work up No results for input(s): VITAMINB12, FOLATE, FERRITIN, TIBC, IRON, RETICCTPCT in the last 72 hours. Urinalysis    Component Value Date/Time   COLORURINE YELLOW 02/19/2021 0440   APPEARANCEUR CLOUDY (A) 02/19/2021 0440   LABSPEC <1.005 (L) 02/19/2021 0440   PHURINE 5.5 02/19/2021 0440   GLUCOSEU >=500 (A) 02/19/2021 0440   HGBUR MODERATE (A) 02/19/2021 0440   BILIRUBINUR NEGATIVE 02/19/2021 0440   BILIRUBINUR negative 07/11/2015 1634   KETONESUR 15 (A) 02/19/2021 0440   PROTEINUR 30 (A) 02/19/2021 0440   UROBILINOGEN 1.0 02/13/2021 2008   NITRITE NEGATIVE 02/19/2021 0440   LEUKOCYTESUR NEGATIVE 02/19/2021 0440   Sepsis Labs Invalid input(s): PROCALCITONIN,  WBC,  LACTICIDVEN Microbiology Recent Results (from the past 240 hour(s))  Urine Culture     Status: Abnormal   Collection Time: 02/19/21  4:49 AM   Specimen: Urine, Clean Catch  Result Value Ref Range Status   Specimen Description URINE, CLEAN CATCH  Final   Special Requests NONE  Final   Culture (A)  Final     10,000 COLONIES/mL LACTOBACILLUS SPECIES Standardized susceptibility testing for this organism is not  available. Performed at [facility], [place] [address], [place], [place] [address]    Report Status 02/20/2021 FINAL  Final  Resp Panel by RT-PCR (Flu A&B, Covid) Nasopharyngeal Swab     Status: None   Collection Time: 02/19/21  9:19 AM   Specimen: Nasopharyngeal Swab; Nasopharyngeal(NP) swabs in vial transport medium  Result Value Ref Range Status   SARS Coronavirus 2 by RT PCR NEGATIVE NEGATIVE Final    Comment: (NOTE) SARS-CoV-2 target nucleic acids are NOT DETECTED.  The SARS-CoV-2 RNA is generally detectable in upper respiratory specimens during the acute phase of infection. The lowest concentration of SARS-CoV-2 viral copies this assay can detect is 138 copies/mL. A negative result does not preclude SARS-Cov-2 infection and should not be used as the sole basis for treatment or other patient management decisions. A negative result may occur with  improper specimen collection/handling, submission of specimen other than nasopharyngeal swab, presence of viral mutation(s) within the areas targeted by this assay, and inadequate number of viral copies(<138 copies/mL). A negative result must be combined with clinical observations, patient history, and epidemiological information. The expected result is Negative.  Fact Sheet for Patients:  EntrepreneurPulse.com.au  Fact Sheet for Healthcare Providers:  IncredibleEmployment.be  This test is no t yet approved or cleared by the [place] FDA and  has been authorized for detection and/or diagnosis of SARS-CoV-2 by FDA under an Emergency Use Authorization (EUA). This EUA will remain  in effect (meaning this test can be used) for the duration of the COVID-19 declaration under Section 564(b)(1) of the Act, 21 U.S.C.section 360bbb-3(b)(1), unless the authorization is terminated  or revoked sooner.        Influenza A by PCR NEGATIVE NEGATIVE Final   Influenza B by PCR NEGATIVE NEGATIVE Final    Comment: (NOTE) The Xpert Xpress SARS-CoV-2/FLU/RSV plus assay is intended as an aid in the diagnosis of influenza from Nasopharyngeal swab specimens and should not be used as a sole basis for treatment. Nasal washings and aspirates are unacceptable for Xpert Xpress SARS-CoV-2/FLU/RSV testing.  Fact Sheet for Patients: EntrepreneurPulse.com.au  Fact Sheet for Healthcare Providers: IncredibleEmployment.be  This test is not yet approved or cleared by the [place] FDA and has been authorized for detection and/or diagnosis of SARS-CoV-2 by FDA under an Emergency Use Authorization (EUA). This EUA will remain in effect (meaning this test can be used) for the duration of the COVID-19 declaration under Section 564(b)(1) of the Act, 21 U.S.C. section 360bbb-3(b)(1), unless the authorization is terminated or revoked.  Performed at [facility], [place] [address], [place], [place] [address]      Time coordinating discharge: Over 30 minutes  SIGNED:   Phillips Climes, MD  [facility] 02/24/2021, 2:07 PM Pager   If 7PM-7AM, please contact night-coverage [email or web address] Password [email or web address]

## 2021-03-04 ENCOUNTER — Encounter (INDEPENDENT_AMBULATORY_CARE_PROVIDER_SITE_OTHER): Payer: Self-pay | Admitting: Primary Care

## 2021-03-04 ENCOUNTER — Ambulatory Visit (INDEPENDENT_AMBULATORY_CARE_PROVIDER_SITE_OTHER): Payer: Self-pay | Admitting: Primary Care

## 2021-03-04 ENCOUNTER — Other Ambulatory Visit: Payer: Self-pay

## 2021-03-04 VITALS — BP 127/88 | HR 87 | Temp 97.5°F | Ht <= 58 in | Wt 109.6 lb

## 2021-03-04 DIAGNOSIS — Z09 Encounter for follow-up examination after completed treatment for conditions other than malignant neoplasm: Secondary | ICD-10-CM

## 2021-03-04 DIAGNOSIS — E119 Type 2 diabetes mellitus without complications: Secondary | ICD-10-CM

## 2021-03-04 MED ORDER — EMPAGLIFLOZIN 25 MG PO TABS
25.0000 mg | ORAL_TABLET | Freq: Every day | ORAL | 1 refills | Status: DC
  Filled 2021-03-04: qty 30, 30d supply, fill #0

## 2021-03-04 NOTE — Progress Notes (Signed)
Pain around waist when sitting  Not sugar cbg as she does not know how to use meter

## 2021-03-04 NOTE — Patient Instructions (Signed)
Diabetes mellitus y nutricin, en adultos Diabetes Mellitus and Nutrition, Adult Si sufre de diabetes, o diabetes mellitus, es muy importante tener hbitos alimenticios saludables debido a que sus niveles de azcar en la sangre (glucosa) se ven afectados en gran medida por lo que come y bebe. Comer alimentos saludables en las cantidades correctas, aproximadamente a la misma hora todos los das, lo ayudar a: Controlar su glucemia. Disminuir el riesgo de sufrir una enfermedad cardaca. Mejorar la presin arterial. Alcanzar o mantener un peso saludable. Qu puede afectar mi plan de alimentacin? Todas las personas que sufren de diabetes son diferentes y cada una tiene necesidades diferentes en cuanto a un plan de alimentacin. El mdico puede recomendarle que trabaje con un nutricionista para elaborar el mejor plan para usted. Su plan de alimentacin puede variar segn factores como: Las caloras que necesita. Los medicamentos que toma. Su peso. Sus niveles de glucemia, presin arterial y colesterol. Su nivel de actividad. Otras afecciones que tenga, como enfermedades cardacas o renales. Cmo me afectan los carbohidratos? Los carbohidratos, o hidratos de carbono, afectan su nivel de glucemia ms que cualquier otro tipo de alimento. La ingesta de carbohidratos aumenta la cantidad de glucosa en la sangre. Es importante conocer la cantidad de carbohidratos que se pueden ingerir en cada comida sin correr ningn riesgo. Esto es diferente en cada persona. Su nutricionista puede ayudarlo a calcular la cantidad de carbohidratos que debe ingerir en cada comida y en cada refrigerio. Cmo me afecta el alcohol? El alcohol puede provocar una disminucin de la glucemia (hipoglucemia), especialmente si usa insulina o toma determinados medicamentos por va oral para la diabetes. La hipoglucemia es una afeccin potencialmente mortal. Los sntomas de la hipoglucemia, como somnolencia, mareos y confusin, son  similares a los sntomas de haber consumido demasiado alcohol. No beba alcohol si: Su mdico le indica no hacerlo. Est embarazada, puede estar embarazada o est tratando de quedar embarazada. Si bebe alcohol: Limite la cantidad que bebe a lo siguiente: De 0 a 1 medida por da para las mujeres. De 0 a 2 medidas por da para los hombres. Sepa cunta cantidad de alcohol hay en las bebidas que toma. En los [place], una medida equivale a una botella de cerveza de 12 oz (355 ml), un vaso de vino de 5 oz (148 ml) o un vaso de una bebida alcohlica de alta graduacin de 1 oz (44 ml). Mantngase hidratado bebiendo agua, refrescos dietticos o t helado sin azcar. Tenga en cuenta que los refrescos comunes, los jugos y otras bebidas para mezclar pueden contener mucha azcar y se deben contar como carbohidratos. Consejos para seguir este plan Leer las etiquetas de los alimentos Comience por leer el tamao de la porcin en la etiqueta de Informacin nutricional de los alimentos envasados y las bebidas. La cantidad de caloras, carbohidratos, grasas y otros nutrientes detallados en la etiqueta se basan en una porcin del alimento. Muchos alimentos contienen ms de una porcin por envase. Verifique la cantidad total de gramos (g) de carbohidratos totales en una porcin. Verifique la cantidad de gramos de grasas saturadas y grasas trans en una porcin. Escoja alimentos que no contengan estas grasas o que su contenido de estas sea bajo. Verifique la cantidad de miligramos (mg) de sal (sodio) en una porcin. La mayora de las personas deben limitar la ingesta de sodio total a menos de 2300 mg por da. Siempre consulte la informacin nutricional de los alimentos etiquetados como "con bajo contenido de grasa" o "sin grasa". Estos   alimentos pueden tener un mayor contenido de azcar agregada o carbohidratos refinados, y deben evitarse. Hable con su nutricionista para identificar sus objetivos diarios en cuanto  a los nutrientes mencionados en la etiqueta. Al ir de compras Evite comprar alimentos procesados, enlatados o precocidos. Estos alimentos tienden a tener una mayor cantidad de grasa, sodio y azcar agregada. Compre en la zona exterior de la tienda de comestibles. Esta es la zona donde se encuentran con mayor frecuencia las frutas y las verduras frescas, los cereales a granel, las carnes frescas y los productos lcteos frescos. Al cocinar Use mtodos de coccin a baja temperatura, como hornear, en lugar de mtodos de coccin a alta temperatura, como frer en abundante aceite. Cocine con aceites saludables, como el aceite de oliva, canola o girasol. Evite cocinar con manteca, crema o carnes con alto contenido de grasa. Planificacin de las comidas Coma las comidas y los refrigerios regularmente, preferentemente a la misma hora todos los das. Evite pasar largos perodos de tiempo sin comer. Consuma alimentos ricos en fibra, como frutas frescas, verduras, frijoles y cereales integrales. Consuma entre 4 y 6 onzas (entre 112 y 168 g) de protenas magras por da, como carnes magras, pollo, pescado, huevos o tofu. Una onza (oz) (28 g) de protena magra equivale a: 1 onza (28 g) de carne, pollo o pescado. 1 huevo.  taza (62 g) de tofu. Coma algunos alimentos por da que contengan grasas saludables, como aguacates, frutos secos, semillas y pescado. Qu alimentos debo comer? Frutas Bayas. Manzanas. Naranjas. Duraznos. Damascos. Ciruelas. Uvas. Mangos. Papayas. Granadas. Kiwi. Cerezas. Verduras Verduras de hoja verde, que incluyen lechuga, espinaca, col rizada, acelga, hojas de berza, hojas de mostaza y repollo. Remolachas. Coliflor. Brcoli. Zanahorias. Judas verdes. Tomates. Pimientos. Cebollas. Pepinos. Coles de Bruselas. Granos Granos integrales, como panes, galletas, tortillas, cereales y pastas de salvado o integrales. Avena sin azcar. Quinua. Arroz integral o salvaje. Carnes y otras  protenas Frutos de mar. Carne de ave sin piel. Cortes magros de ave y carne de res. Tofu. Frutos secos. Semillas. Lcteos Productos lcteos sin grasa o con bajo contenido de grasa, como leche, yogur y queso. Es posible que los productos detallados arriba no constituyan una lista completa de los alimentos y las bebidas que puede tomar. Consulte a un nutricionista para obtener ms informacin. Qu alimentos debo evitar? Frutas Frutas enlatadas al almbar. Verduras Verduras enlatadas. Verduras congeladas con mantequilla o salsa de crema. Granos Productos elaborados con harina y harina blanca refinada, como panes, pastas, bocadillos y cereales. Evite todos los alimentos procesados. Carnes y otras protenas Cortes de carne con alto contenido de grasa. Carne de ave con piel. Carnes empanizadas o fritas. Carne procesada. Evite las grasas saturadas. Lcteos Yogur, queso o leche enteros. Bebidas Bebidas azucaradas, como gaseosas o t helado. Es posible que los productos que se enumeran ms arriba no constituyan una lista completa de los alimentos y las bebidas que debe evitar. Consulte a un nutricionista para obtener ms informacin. Preguntas para hacerle al mdico Debo consultar con un especialista certificado en atencin y educacin sobre la diabetes? Es necesario que me rena con un nutricionista? A qu nmero puedo llamar si tengo preguntas? Cules son los mejores momentos para controlar la glucemia? Dnde encontrar ms informacin: American Diabetes Association (Asociacin Estadounidense de la Diabetes): diabetes.org Academy of Nutrition and Dietetics (Academia de Nutricin y Diettica): eatright.org National Institute of Diabetes and Digestive and Kidney Diseases (Instituto Nacional de la Diabetes y las Enfermedades Digestivas y Renales): niddk.nih.gov Association of Diabetes Care &   Education Specialists (Asociacin de Especialistas en Atencin y Educacin sobre la Diabetes):  diabeteseducator.org Resumen Es importante tener hbitos alimenticios saludables debido a que sus niveles de azcar en la sangre (glucosa) se ven afectados en gran medida por lo que come y bebe. Es importante consumir alcohol con prudencia. Un plan de comidas saludable lo ayudar a controlar la glucosa en sangre y a reducir el riesgo de enfermedades cardacas. El mdico puede recomendarle que trabaje con un nutricionista para elaborar el mejor plan para usted. Esta informacin no tiene como fin reemplazar el consejo del mdico. Asegrese de hacerle al mdico cualquier pregunta que tenga. Document Revised: 10/04/2019 Document Reviewed: 10/04/2019 Elsevier Patient Education  2022 Elsevier Inc.  

## 2021-03-04 NOTE — Progress Notes (Signed)
[facility]   Subjective:   Katie Simmons is a 46 y.o. 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 Admit date to the hospital was 02/18/21, patient was discharged from the hospital on 02/23/21, patient was admitted for: Sepsis ([place]) ,Diabetes mellitus without complication ([place]), Pyelonephritis, Nausea vomiting and diarrhea, AKI (acute kidney injury) ([place]), Hypokalemia, Hyponatremia, Cholelithiasis and Transaminitis. Current symptoms/problems include polydipsia, polyuria, and visual disturbances and have been worsening. Symptoms have been present for 6 months. Today she denies shortness of breath, headaches, chest pain or lower extremity edema. She still has some abdomin discomfort Past Medical History:  Diagnosis Date   Diabetes mellitus without complication (HCC)      Allergies  Allergen Reactions   Penicillins Itching    Has patient had a PCN reaction causing immediate rash, facial/tongue/throat swelling, SOB or lightheadedness with hypotension: No Has patient had a PCN reaction causing severe rash involving mucus membranes or skin necrosis: No Has patient had a PCN reaction that required hospitalization: No Has patient had a PCN reaction occurring within the last 10 years: Yes If all of the above answers are "NO", then may proceed with Cephalosporin use.      Current Outpatient Medications on File Prior to Visit  Medication Sig Dispense Refill   insulin aspart protamine - aspart (NOVOLOG 70/30 MIX) (70-30) 100 UNIT/ML FlexPen Inject 16 Units into the skin 2 (two) times daily with a meal. 12 mL 0   acetaminophen (TYLENOL) 500 MG tablet Take 500 mg by mouth every 6 (six) hours as needed for moderate pain or headache.     Blood Glucose Monitoring Suppl (TRUE METRIX METER) w/Device KIT 1 each by Does not apply route 3 (three) times daily. (Patient not taking: Reported on 02/19/2021) 1 kit 0   glucose blood (TRUE  METRIX BLOOD GLUCOSE TEST) test strip Use as instructed (Patient not taking: Reported on 02/19/2021) 100 each 11   Insulin Pen Needle 32G X 4 MM MISC use as directed up to four times daily 100 each 0   metFORMIN (GLUCOPHAGE) 500 MG tablet Take 1 tablet (500 mg total) by mouth 2 (two) times daily with a meal. (Patient not taking: Reported on 03/04/2021) 60 tablet 0   TRUEPLUS LANCETS 28G MISC 1 each by Does not apply route 3 (three) times daily. (Patient not taking: Reported on 02/19/2021) 100 each 11   [DISCONTINUED] glipiZIDE (GLIPIZIDE XL) 5 MG 24 hr tablet Take 1 tablet (5 mg total) by mouth daily with breakfast. (Patient not taking: Reported on 01/03/2018) 30 tablet 2   No current facility-administered medications on file prior to visit.   Review of System: Comprehensive ROS Pertinent positive and negative noted in HPI    Objective:  BP 127/88 (BP Location: Right Arm, Patient Position: Sitting, Cuff Size: Normal)    Pulse 87    Temp (!) 97.5 F (36.4 C) (Oral)    Ht '4\' 8"'  (1.422 m)    Wt 109 lb 9.6 oz (49.7 kg)    LMP 02/13/2021 (Exact Date)    SpO2 97%    BMI 24.57 kg/m   Filed Weights   03/04/21 1505  Weight: 109 lb 9.6 oz (49.7 kg)   Physical Exam: General Appearance: Well nourished, in no apparent distress. Eyes: PERRLA, EOMs, conjunctiva no swelling or erythema Sinuses: No Frontal/maxillary tenderness ENT/Mouth: Ext aud canals clear, TMs without erythema, bulging. Hearing normal.  Neck: Supple, thyroid normal.  Respiratory: Respiratory effort normal, BS equal bilaterally  without rales, rhonchi, wheezing or stridor.  Cardio: RRR with no MRGs. Brisk peripheral pulses without edema.  Abdomen: Soft, + BS.  Non tender, no guarding, rebound, hernias, masses. Lymphatics: Non tender without lymphadenopathy.  Musculoskeletal: Full ROM, 5/5 strength, normal gait.  Skin: Warm, dry without rashes, lesions, ecchymosis.  Neuro: Cranial nerves intact. Normal muscle tone, no cerebellar  symptoms. Sensation intact.  Psych: Awake and oriented X 3, normal affect, Insight and Judgment appropriate.    Assessment:  Katie Simmons was seen today for hospitalization follow-up.  Diagnoses and all orders for this visit:  Diabetes mellitus without complication (HCC) B5Q 64.8. Discussed  co- morbidities with uncontrol diabetes  Complications -diabetic retinopathy, (close your eyes ? What do you see nothing) nephropathy decrease in kidney function- can lead to dialysis-on a machine 3 days a week to filter your kidney, neuropathy- numbness and tinging in your hands and feet,  increase risk of heart attack and stroke, and amputation due to decrease wound healing and circulation. Decrease your risk by taking medication daily as prescribed, monitor carbohydrates- foods that are high in carbohydrates are the following rice, potatoes, breads, sugars, and pastas.  Reduction in the intake (eating) will assist in lowering your blood sugars. Exercise daily at least 30 minutes daily.  Continue 70/30 16 units BID, and metformin 581m BID Added-     empagliflozin (JARDIANCE) 25 MG TABS tablet; Take 1 tablet (25 mg total) by mouth daily before breakfast.  Hospital discharge follow-up Retrieved from d/c  Follow-Ups: Follow up with EKerin Perna NP (Internal Medicine); TIME: 2:30 PM  DATE: JANUARY 24 , 2023  DOCTOR: Andrienne Havener    This note has been created with DSurveyor, quantity Any transcriptional errors are unintentional.   MKerin Perna NP 03/04/2021, 3:25 PM

## 2021-03-10 ENCOUNTER — Other Ambulatory Visit: Payer: Self-pay

## 2021-05-20 ENCOUNTER — Ambulatory Visit (INDEPENDENT_AMBULATORY_CARE_PROVIDER_SITE_OTHER): Payer: Self-pay | Admitting: Primary Care

## 2021-07-15 ENCOUNTER — Other Ambulatory Visit: Payer: Self-pay

## 2021-07-15 ENCOUNTER — Ambulatory Visit (INDEPENDENT_AMBULATORY_CARE_PROVIDER_SITE_OTHER): Payer: Self-pay | Admitting: Primary Care

## 2021-07-15 ENCOUNTER — Encounter (INDEPENDENT_AMBULATORY_CARE_PROVIDER_SITE_OTHER): Payer: Self-pay | Admitting: Primary Care

## 2021-07-15 VITALS — BP 112/76 | HR 84 | Temp 98.0°F | Ht <= 58 in | Wt 116.2 lb

## 2021-07-15 DIAGNOSIS — E119 Type 2 diabetes mellitus without complications: Secondary | ICD-10-CM

## 2021-07-15 DIAGNOSIS — J301 Allergic rhinitis due to pollen: Secondary | ICD-10-CM

## 2021-07-15 DIAGNOSIS — Z760 Encounter for issue of repeat prescription: Secondary | ICD-10-CM

## 2021-07-15 LAB — POCT GLYCOSYLATED HEMOGLOBIN (HGB A1C)

## 2021-07-15 MED ORDER — LORATADINE 10 MG PO TABS
10.0000 mg | ORAL_TABLET | Freq: Every day | ORAL | 3 refills | Status: DC
  Filled 2021-07-15: qty 90, 90d supply, fill #0

## 2021-07-15 MED ORDER — FLUTICASONE PROPIONATE 50 MCG/ACT NA SUSP
2.0000 | Freq: Every day | NASAL | 6 refills | Status: DC
  Filled 2021-07-15: qty 16, 30d supply, fill #0

## 2021-07-15 MED ORDER — INSULIN ASPART PROT & ASPART (70-30 MIX) 100 UNIT/ML PEN
16.0000 [IU] | PEN_INJECTOR | Freq: Two times a day (BID) | SUBCUTANEOUS | 0 refills | Status: DC
  Filled 2021-07-15: qty 12, 38d supply, fill #0
  Filled 2021-07-15: qty 6, 19d supply, fill #0

## 2021-07-15 MED ORDER — EMPAGLIFLOZIN 25 MG PO TABS
25.0000 mg | ORAL_TABLET | Freq: Every day | ORAL | 1 refills | Status: DC
  Filled 2021-07-15: qty 90, 90d supply, fill #0
  Filled 2021-07-15: qty 14, 14d supply, fill #0

## 2021-07-15 MED ORDER — GLIPIZIDE 10 MG PO TABS
10.0000 mg | ORAL_TABLET | Freq: Two times a day (BID) | ORAL | 3 refills | Status: DC
  Filled 2021-07-15: qty 60, 30d supply, fill #0

## 2021-07-15 MED ORDER — METFORMIN HCL 1000 MG PO TABS
1000.0000 mg | ORAL_TABLET | Freq: Two times a day (BID) | ORAL | 3 refills | Status: DC
  Filled 2021-07-15: qty 180, 90d supply, fill #0

## 2021-07-15 NOTE — Progress Notes (Unsigned)
Katie Simmons, is a 46 y.o. female  AQT:[id number]  TGY:[id number]  DOB - 23-Mar-1975  Chief Complaint  Patient presents with   Diabetes      Katie Simmons [id number] Subjective:   Katie Simmons Katie Simmons is a 46 y.o. female here today for a follow up visit. Patient has No headache, No chest pain, No abdominal pain - No Nausea, No new weakness tingling or numbness, No Cough - shortness of breath  No problems updated.  Allergies  Allergen Reactions   Penicillins Itching    Has patient had a PCN reaction causing immediate rash, facial/tongue/throat swelling, SOB or lightheadedness with hypotension: No Has patient had a PCN reaction causing severe rash involving mucus membranes or skin necrosis: No Has patient had a PCN reaction that required hospitalization: No Has patient had a PCN reaction occurring within the last 10 years: Yes If all of the above answers are "NO", then may proceed with Cephalosporin use.    Past Medical History:  Diagnosis Date   Diabetes mellitus without complication ([place])     Current Outpatient Medications on File Prior to Visit  Medication Sig Dispense Refill   Blood Glucose Monitoring Suppl (TRUE METRIX METER) w/Device KIT 1 each by Does not apply route 3 (three) times daily. 1 kit 0   insulin aspart protamine - aspart (NOVOLOG 70/30 MIX) (70-30) 100 UNIT/ML FlexPen Inject 16 Units into the skin 2 (two) times daily with a meal. 12 mL 0   acetaminophen (TYLENOL) 500 MG tablet Take 500 mg by mouth every 6 (six) hours as needed for moderate pain or headache. (Patient not taking: Reported on 07/15/2021)     empagliflozin (JARDIANCE) 25 MG TABS tablet Take 1 tablet (25 mg total) by mouth daily before breakfast. (Patient not taking: Reported on 07/15/2021) 90 tablet 1   glucose blood (TRUE METRIX BLOOD GLUCOSE TEST) test strip Use as instructed (Patient not taking: Reported on 02/19/2021) 100 each 11   Insulin Pen Needle 32G X 4 MM  MISC use as directed up to four times daily (Patient not taking: Reported on 07/15/2021) 100 each 0   metFORMIN (GLUCOPHAGE) 500 MG tablet Take 1 tablet (500 mg total) by mouth 2 (two) times daily with a meal. (Patient not taking: Reported on 03/04/2021) 60 tablet 0   TRUEPLUS LANCETS 28G MISC 1 each by Does not apply route 3 (three) times daily. (Patient not taking: Reported on 02/19/2021) 100 each 11   [DISCONTINUED] glipiZIDE (GLIPIZIDE XL) 5 MG 24 hr tablet Take 1 tablet (5 mg total) by mouth daily with breakfast. (Patient not taking: Reported on 01/03/2018) 30 tablet 2   No current facility-administered medications on file prior to visit.    Objective:   Vitals:   07/15/21 1129  BP: 112/76  Pulse: 84  Temp: 98 F (36.7 C)  TempSrc: Oral  SpO2: 94%  Weight: 116 lb 3.2 oz (52.7 kg)  Height: '4\' 8"'  (1.422 m)    Exam General appearance : Awake, alert, not in any distress. Speech Clear. Not toxic looking HEENT: Atraumatic and Normocephalic, pupils equally reactive to light and accomodation Neck: Supple, no JVD. No cervical lymphadenopathy.  Chest: Good air entry bilaterally, no added sounds  CVS: S1 S2 regular, no murmurs.  Abdomen: Bowel sounds present, Non tender and not distended with no gaurding, rigidity or rebound. Extremities: B/L Lower Ext shows no edema, both legs are warm to touch Neurology: Awake alert, and oriented X 3, CN II-XII intact, Non  focal Skin: No Rash  Data Review Lab Results  Component Value Date   HGBA1C  07/15/2021     Comment:     error code 106, meaning patient a1c >15   HGBA1C 14.7 (H) 02/19/2021   HGBA1C 12.7 (H) 01/03/2018    Assessment & Plan   1. Diabetes mellitus without complication (HCC) - HgB A1c > 15 Discussed  co- morbidities with uncontrol diabetes  Complications -diabetic retinopathy, (close your eyes ? What do you see nothing) nephropathy decrease in kidney function- can lead to dialysis-on a machine 3 days a week to filter your  kidney, neuropathy- numbness and tinging in your hands and feet,  increase risk of heart attack and stroke, and amputation due to decrease wound healing and circulation. Decrease your risk by taking medication daily as prescribed, monitor carbohydrates- foods that are high in carbohydrates are the following rice, potatoes, breads, sugars, and pastas.  Reduction in the intake (eating) will assist in lowering your blood sugars. Exercise daily at least 30 minutes daily.    Patient have been counseled extensively about nutrition and exercise. Other issues discussed during this visit include: low cholesterol diet, weight control and daily exercise, foot care, annual eye examinations at Ophthalmology, importance of adherence with medications and regular follow-up. We also discussed long term complications of uncontrolled diabetes and hypertension.   No follow-ups on file.  The patient was given clear instructions to go to ER or return to medical center if symptoms don't improve, worsen or new problems develop. The patient verbalized understanding. The patient was told to call to get lab results if they haven't heard anything in the next week.   This note has been created with Surveyor, quantity. Any transcriptional errors are unintentional.   Kerin Perna, NP 07/15/2021, 12:03 PM

## 2021-07-15 NOTE — Patient Instructions (Signed)
Diabetes mellitus y actividad f?sica ?Diabetes Mellitus and Exercise ?Hacer actividad f?sica habitualmente es importante para el estado de salud general, en especial para las personas que tienen diabetes mellitus. La actividad f?sica no solo se reduce a bajar de peso. Aporta muchos beneficios para la salud, como aumento de la fuerza muscular y la densidad ?sea, y reducci?n de las grasas corporales y el estr?s. Esto mejora el estado f?sico, la flexibilidad y la resistencia, y todo ello redunda en un mejor estado de salud general. ??Cu?les son los beneficios de la actividad f?sica si tengo diabetes? ?La actividad f?sica tiene muchos beneficios para las personas con diabetes. Incluyen los siguientes: ?Ayuda a bajar y mantener el az?car en la sangre (glucosa) bajo control. ?Mejora la respuesta del cuerpo a la hormona insulina porque optimiza la sensibilidad a la insulina. ?Reduce la cantidad de insulina que el cuerpo necesita. ?Reduce el riesgo de tener una enfermedad card?aca porque: ?Baja los niveles de colesterol ?malo? y triglic?ridos. ?Aumenta los niveles de colesterol ?bueno?. ?Baja la presi?n arterial. ?Disminuye la glucemia. ??Cu?l es mi plan de actividad? ?El m?dico o un educador para la diabetes certificado pueden ayudarlo a elaborar un plan respecto del tipo y de la frecuencia de actividad f?sica adecuado para usted. Esto se denomina ?plan de actividad?. Aseg?rese de lo siguiente: ?Haga por lo menos 150 minutos semanales de ejercicios de intensidad media o alta. Los ejercicios pueden incluir caminar a paso r?pido, andar en bicicleta o hacer gimnasia aer?bica en el agua. ?Haga ejercicios de elongaci?n y de fortalecimiento, como yoga o levantamiento de pesas, por lo menos 2 veces por semana. ?Reparta la actividad en al menos 3 d?as de la semana. ?Haga alg?n tipo de actividad f?sica cada d?a. ?No deje pasar m?s de 2 d?as seguidos sin hacer alg?n tipo de actividad f?sica. ?No permanezca inactivo durante m?s de 90  minutos seguidos. T?mese descansos frecuentes para caminar o estirarse. ?Elija ejercicios o actividades que disfrute. Establezca objetivos realistas. ?Comience lentamente y aumente de manera gradual la intensidad de la actividad f?sica con el correr del tiempo. ??C?mo controlo la diabetes durante la actividad f?sica? ? ?Controlar su nivel de glucemia ?Contr?lese la glucemia antes y despu?s de ejercitarse. Si el nivel de glucemia es: ?240 mg/dl (13.3 mmol/l) o m?s antes de comenzar a hacer actividad f?sica, controle la orina para detectar la presencia de cetonas. Estas son sustancias qu?micas producidas por el h?gado. Si tiene cetonas en la orina, no haga ejercicio hasta que la glucemia se normalice. ?100 mg/dl (5.6 mmol/l) o menos, tome una colaci?n que contenga entre 15 y 20 gramos de carbohidratos. Controle la glucemia 15 minutos despu?s de la colaci?n para asegurarse de que el nivel de glucosa est? por encima de 100 mg/dl (5.6 mmol/l) antes de comenzar a hacer actividad f?sica. ?Conozca los s?ntomas de la glucemia baja (hipoglucemia) y aprenda c?mo tratarla. El riesgo de tener hipoglucemia aumenta durante y despu?s de hacer actividad f?sica. ?Siga estos consejos y las instrucciones del m?dico ?Tenga una colaci?n de carbohidratos que sea de acci?n r?pida antes, durante y despu?s de ejercitarse, a fin de evitar o tratar la hipoglucemia. ?Evite inyectarse insulina en las zonas del cuerpo que ejercitar?. Por ejemplo, evite inyectarse insulina en: ?Los brazos, cuando est? por jugar al tenis. ?Las piernas, cuando est? por irse a trotar. ?Lleve registros de sus h?bitos de actividad f?sica. Esto puede ayudarlos a usted y al m?dico a adaptar el plan de control de la diabetes seg?n sea necesario. Escriba los siguientes datos: ?Los alimentos   que consume antes y despu?s de hacer actividad f?sica. ?Los niveles de glucemia antes y despu?s de hacer ejercicios. ?El tipo y cantidad de actividad f?sica que realiza. ?Trabaje con el  m?dico cuando comience un nuevo tipo de actividad f?sica o ejercicio. Es posible que el m?dico deba hacer lo siguiente: ?Asegurarse de que la actividad sea segura para usted. ?Ajustar la insulina, los otros medicamentos y los alimentos que usted consume. ?Beba mucha agua mientras hace ejercicio. Esto previene la p?rdida de agua (deshidrataci?n) y los problemas causados por mucho calor en el cuerpo (golpe de calor). ?D?nde buscar m?s informaci?n ?American Diabetes Association (Asociaci?n [place] de la Diabetes): www.diabetes.org ?Resumen ?Hacer actividad f?sica habitualmente es importante para el estado de salud general, en especial para las personas que tienen diabetes mellitus. ?Hacer actividad f?sica tiene muchos beneficios para la salud. Aumenta la fuerza muscular y la densidad ?sea, y reduce las grasas corporales y el estr?s. Tambi?n disminuye y controla la glucemia. ?El m?dico o un educador para la diabetes certificado puede ayudarlo a elaborar un plan de actividades respecto del tipo y de la frecuencia de actividad f?sica adecuados para usted. ?Consulte al m?dico para asegurarse de que cualquier actividad nueva sea segura para usted. Tambi?n trabaje con el m?dico para ajustar la insulina, los otros medicamentos y los alimentos que consume. ?Esta informaci?n no tiene como fin reemplazar el consejo del m?dico. Aseg?rese de hacerle al m?dico cualquier pregunta que tenga. ?Document Revised: 01/27/2019 Document Reviewed: 01/27/2019 ?Elsevier Patient Education ? 2023 Elsevier Inc. ? ?

## 2021-07-22 ENCOUNTER — Other Ambulatory Visit: Payer: Self-pay

## 2021-07-29 ENCOUNTER — Other Ambulatory Visit: Payer: Self-pay

## 2021-07-30 ENCOUNTER — Other Ambulatory Visit: Payer: Self-pay

## 2021-07-31 ENCOUNTER — Other Ambulatory Visit: Payer: Self-pay

## 2021-08-21 ENCOUNTER — Ambulatory Visit: Payer: Self-pay | Admitting: Pharmacist

## 2021-08-22 ENCOUNTER — Other Ambulatory Visit: Payer: Self-pay

## 2021-08-28 ENCOUNTER — Other Ambulatory Visit: Payer: Self-pay

## 2021-09-03 ENCOUNTER — Other Ambulatory Visit: Payer: Self-pay

## 2021-09-18 ENCOUNTER — Other Ambulatory Visit: Payer: Self-pay

## 2021-10-15 ENCOUNTER — Encounter (INDEPENDENT_AMBULATORY_CARE_PROVIDER_SITE_OTHER): Payer: Self-pay | Admitting: Primary Care

## 2021-10-15 ENCOUNTER — Ambulatory Visit (INDEPENDENT_AMBULATORY_CARE_PROVIDER_SITE_OTHER): Payer: Self-pay | Admitting: Primary Care

## 2021-10-15 ENCOUNTER — Other Ambulatory Visit: Payer: Self-pay

## 2021-10-15 VITALS — BP 123/82 | HR 81 | Temp 98.1°F | Ht <= 58 in | Wt 118.4 lb

## 2021-10-15 DIAGNOSIS — E119 Type 2 diabetes mellitus without complications: Secondary | ICD-10-CM

## 2021-10-15 LAB — POCT GLYCOSYLATED HEMOGLOBIN (HGB A1C)

## 2021-10-15 LAB — POCT URINALYSIS DIP (CLINITEK)
Bilirubin, UA: NEGATIVE
Glucose, UA: >=1000 mg/dL — AB
Leukocytes, UA: NEGATIVE
Nitrite, UA: NEGATIVE
POC PROTEIN,UA: NEGATIVE
Spec Grav, UA: 1.015 (ref 1.010–1.025)
Urobilinogen, UA: 0.2 E.U./dL
pH, UA: 6 (ref 5.0–8.0)

## 2021-10-15 LAB — GLUCOSE, POCT (MANUAL RESULT ENTRY): POC Glucose: 349 mg/dl — AB (ref 70–99)

## 2021-10-15 MED ORDER — BASAGLAR KWIKPEN 100 UNIT/ML ~~LOC~~ SOPN
26.0000 [IU] | PEN_INJECTOR | Freq: Every day | SUBCUTANEOUS | 3 refills | Status: DC
  Filled 2021-10-15: qty 9, 34d supply, fill #0

## 2021-10-15 MED ORDER — GLIPIZIDE 10 MG PO TABS
10.0000 mg | ORAL_TABLET | Freq: Two times a day (BID) | ORAL | 1 refills | Status: DC
  Filled 2021-10-15: qty 180, 90d supply, fill #0

## 2021-10-15 MED ORDER — EMPAGLIFLOZIN 25 MG PO TABS
25.0000 mg | ORAL_TABLET | Freq: Every day | ORAL | 1 refills | Status: DC
  Filled 2021-10-15: qty 90, 90d supply, fill #0

## 2021-10-15 NOTE — Progress Notes (Signed)
Subjective:  Patient ID: Katie Simmons, female    DOB: 10-14-1975  Age: 46 y.o. MRN: [id number]  CC: Diabetes   HPI Katie Simmons is a 46 year old Hispanic female presents for follow-up of diabetes.  (Interpreter Tania [phone] )patient does not check blood sugar at home  Compliant with meds - No Checking CBGs? No  Fasting avg -   Postprandial average -  Exercising regularly? - No Watching carbohydrate intake? - No Neuropathy ? - Yes Hypoglycemic events - No  - Recovers with :   Pertinent ROS:  Polyuria - Yes Polydipsia - Yes Vision problems - Yes  Medications as noted below. Taking them regularly without complication/adverse reaction being reported today.   History Katie Simmons has a past medical history of Diabetes mellitus without complication ([place]).   Katie Simmons has no past surgical history on file.   Katie Simmons family history is not on file.Katie Simmons reports that Katie Simmons has never smoked. Katie Simmons has never used smokeless tobacco. Katie Simmons reports that Katie Simmons does not drink alcohol and does not use drugs.  Current Outpatient Medications on File Prior to Visit  Medication Sig Dispense Refill   metFORMIN (GLUCOPHAGE) 1000 MG tablet Take 1 tablet (1,000 mg total) by mouth 2 (two) times daily with a meal. 180 tablet 3   acetaminophen (TYLENOL) 500 MG tablet Take 500 mg by mouth every 6 (six) hours as needed for moderate pain or headache. (Patient not taking: Reported on 07/15/2021)     Blood Glucose Monitoring Suppl (TRUE METRIX METER) w/Device KIT 1 each by Does not apply route 3 (three) times daily. 1 kit 0   fluticasone (FLONASE) 50 MCG/ACT nasal spray Place 2 sprays into both nostrils daily. 16 g 6   glucose blood (TRUE METRIX BLOOD GLUCOSE TEST) test strip Use as instructed (Patient not taking: Reported on 02/19/2021) 100 each 11   Insulin Pen Needle 32G X 4 MM MISC use as directed up to four times daily (Patient not taking: Reported on 07/15/2021) 100 each 0   loratadine (CLARITIN)  10 MG tablet Take 1 tablet (10 mg total) by mouth daily. 90 tablet 3   TRUEPLUS LANCETS 28G MISC 1 each by Does not apply route 3 (three) times daily. (Patient not taking: Reported on 02/19/2021) 100 each 11   No current facility-administered medications on file prior to visit.    ROS Comprehensive ROS Pertinent positive and negative noted in HPI    Objective:  BP 123/82   Pulse 81   Temp 98.1 F (36.7 C) (Oral)   Ht '4\' 8"'  (1.422 m)   Wt 118 lb 6.4 oz (53.7 kg)   LMP  (LMP Unknown)   SpO2 97%   BMI 26.54 kg/m   BP Readings from Last 3 Encounters:  10/15/21 123/82  07/15/21 112/76  03/04/21 127/88    Wt Readings from Last 3 Encounters:  10/15/21 118 lb 6.4 oz (53.7 kg)  07/15/21 116 lb 3.2 oz (52.7 kg)  03/04/21 109 lb 9.6 oz (49.7 kg)    Physical Exam General: No apparent distress. Eyes: Extraocular eye movements intact, pupils equal and round. Neck: Supple, trachea midline. Thyroid: No enlargement, mobile without fixation, no tenderness. Cardiovascular: Regular rhythm and rate, no murmur, normal radial pulses. Respiratory: Normal respiratory effort, clear to auscultation. Gastrointestinal: Normal pitch active bowel sounds, nontender abdomen without distention or appreciable hepatomegaly. Musculoskeletal: Normal muscle tone, no tenderness on palpation of tibia, no excessive thoracic kyphosis. Skin: Appropriate warmth, no visible rash. Mental status: Alert, conversant,  speech clear, thought logical, appropriate mood and affect, no hallucinations or delusions evident. Hematologic/lymphatic: No cervical adenopathy, no visible ecchymoses.   Lab Results  Component Value Date   HGBA1C  10/15/2021     Comment:     a1c greater than 15. error code 106   HGBA1C  07/15/2021     Comment:     error code 106, meaning patient a1c >15   HGBA1C 14.7 (H) 02/19/2021    Lab Results  Component Value Date   WBC 7.8 02/22/2021   HGB 12.5 02/22/2021   HCT 36.5 02/22/2021   PLT  306 02/22/2021   GLUCOSE 273 (H) 02/22/2021   ALT 30 02/20/2021   AST 26 02/20/2021   NA 135 02/22/2021   K 4.2 02/22/2021   CL 101 02/22/2021   CREATININE 0.55 02/22/2021   BUN 14 02/22/2021   CO2 26 02/22/2021   TSH 3.539 07/25/2019   HGBA1C  10/15/2021     Comment:     a1c greater than 15. error code 106     Assessment & Plan:   Katie Simmons was seen today for diabetes.  Diagnoses and all orders for this visit:  Diabetes mellitus without complication ([place]) Adherence is suboptimal.  We discussed again Discussed  co- morbidities with uncontrol diabetes  Complications -diabetic retinopathy, (close your eyes ? What do you see nothing) nephropathy decrease in kidney function- can lead to dialysis-on a machine 3 days a week to filter your kidney, neuropathy- numbness and tinging in your hands and feet,  increase risk of heart attack and stroke, and amputation due to decrease wound healing and circulation. Decrease your risk by taking medication daily as prescribed, monitor carbohydrates- foods that are high in carbohydrates are the following rice, potatoes, breads, sugars, and pastas.  Reduction in the intake (eating) will assist in lowering your blood sugars. Exercise daily at least 30 minutes daily.  Patient was also able to repeat or remember the consequences of uncontrolled diabetes.  There is a language barrier make sure that the interpreter stress just because the medicine bottle is empty you have refills.  Only thing you need to do is call and they will have your medication ready.  Katie Simmons was tearful.  Katie Simmons is scheduled for follow-up with [facility] clinical pharmacist for more diabetic teaching and understanding Medication changes discontinued 70/30 insulin changed to Basaglar 26 units in the morning continue metformin 1000 mg twice daily and Glucotrol 10 mg twice daily -     HgB A1c -     CBC with Differential -     Lipid Panel -     CMP14+EGFR   Other orders -     Insulin Glargine  (BASAGLAR KWIKPEN) 100 UNIT/ML; Inject 26 Units into the skin daily after breakfast.    I have discontinued Katie Simmons Katie Simmons's insulin aspart protamine - aspart. I am also having Katie Simmons start on [place]. Additionally, I am having Katie Simmons maintain Katie Simmons True Metrix Meter, glucose blood, TRUEplus Lancets 28G, acetaminophen, Insulin Pen Needle, metFORMIN, loratadine, fluticasone, empagliflozin, and glipiZIDE.  Meds ordered this encounter  Medications   Insulin Glargine (BASAGLAR KWIKPEN) 100 UNIT/ML    Sig: Inject 26 Units into the skin daily after breakfast.    Dispense:  3 mL    Refill:  3    Order Specific Question:   Supervising Provider    Answer:   Tresa Garter [[id number]]   empagliflozin (JARDIANCE) 25 MG TABS tablet    Sig: Take 1  tablet (25 mg total) by mouth daily before breakfast.    Dispense:  90 tablet    Refill:  1    Order Specific Question:   Supervising Provider    Answer:   Tresa Garter [[id number]]   glipiZIDE (GLUCOTROL) 10 MG tablet    Sig: Take 1 tablet (10 mg total) by mouth 2 (two) times daily before a meal.    Dispense:  180 tablet    Refill:  1    Order Specific Question:   Supervising Provider    AnswerTresa Garter [id number]     Follow-up:   No follow-ups on file.  The above assessment and management plan was discussed with the patient. The patient verbalized understanding of and has agreed to the management plan. Patient is aware to call the clinic if symptoms fail to improve or worsen. Patient is aware when to return to the clinic for a follow-up visit. Patient educated on when it is appropriate to go to the emergency department.   Juluis Mire, NP-C

## 2021-10-16 ENCOUNTER — Other Ambulatory Visit: Payer: Self-pay

## 2021-10-16 ENCOUNTER — Encounter: Payer: Self-pay | Admitting: Pharmacist

## 2021-10-16 ENCOUNTER — Ambulatory Visit: Payer: Self-pay | Attending: Primary Care | Admitting: Pharmacist

## 2021-10-16 DIAGNOSIS — E119 Type 2 diabetes mellitus without complications: Secondary | ICD-10-CM

## 2021-10-16 LAB — CBC WITH DIFFERENTIAL/PLATELET
Basophils Absolute: 0.1 10*3/uL (ref 0.0–0.2)
Basos: 1 %
EOS (ABSOLUTE): 0.1 10*3/uL (ref 0.0–0.4)
Eos: 1 %
Hematocrit: 44.1 % (ref 34.0–46.6)
Hemoglobin: 15 g/dL (ref 11.1–15.9)
Immature Grans (Abs): 0 10*3/uL (ref 0.0–0.1)
Immature Granulocytes: 0 %
Lymphocytes Absolute: 3.1 10*3/uL (ref 0.7–3.1)
Lymphs: 42 %
MCH: 32.1 pg (ref 26.6–33.0)
MCHC: 34 g/dL (ref 31.5–35.7)
MCV: 94 fL (ref 79–97)
Monocytes Absolute: 0.5 10*3/uL (ref 0.1–0.9)
Monocytes: 6 %
Neutrophils Absolute: 3.7 10*3/uL (ref 1.4–7.0)
Neutrophils: 50 %
Platelets: 309 10*3/uL (ref 150–450)
RBC: 4.67 x10E6/uL (ref 3.77–5.28)
RDW: 12.6 % (ref 11.7–15.4)
WBC: 7.4 10*3/uL (ref 3.4–10.8)

## 2021-10-16 LAB — CMP14+EGFR
ALT: 48 IU/L — ABNORMAL HIGH (ref 0–32)
AST: 38 IU/L (ref 0–40)
Albumin/Globulin Ratio: 1.2 (ref 1.2–2.2)
Albumin: 4.1 g/dL (ref 3.9–4.9)
Alkaline Phosphatase: 190 IU/L — ABNORMAL HIGH (ref 44–121)
BUN/Creatinine Ratio: 17 (ref 9–23)
BUN: 9 mg/dL (ref 6–24)
Bilirubin Total: 0.3 mg/dL (ref 0.0–1.2)
CO2: 21 mmol/L (ref 20–29)
Calcium: 8.8 mg/dL (ref 8.7–10.2)
Chloride: 98 mmol/L (ref 96–106)
Creatinine, Ser: 0.53 mg/dL — ABNORMAL LOW (ref 0.57–1.00)
Globulin, Total: 3.3 g/dL (ref 1.5–4.5)
Glucose: 345 mg/dL — ABNORMAL HIGH (ref 70–99)
Potassium: 4.2 mmol/L (ref 3.5–5.2)
Sodium: 133 mmol/L — ABNORMAL LOW (ref 134–144)
Total Protein: 7.4 g/dL (ref 6.0–8.5)
eGFR: 116 mL/min/{1.73_m2} (ref >59)

## 2021-10-16 LAB — LIPID PANEL
Chol/HDL Ratio: 5.6 ratio — ABNORMAL HIGH (ref 0.0–4.4)
Cholesterol, Total: 242 mg/dL — ABNORMAL HIGH (ref 100–199)
HDL: 43 mg/dL (ref >39)
LDL Chol Calc (NIH): 143 mg/dL — ABNORMAL HIGH (ref 0–99)
Triglycerides: 305 mg/dL — ABNORMAL HIGH (ref 0–149)
VLDL Cholesterol Cal: 56 mg/dL — ABNORMAL HIGH (ref 5–40)

## 2021-10-16 LAB — HEMOGLOBIN A1C
Est. average glucose Bld gHb Est-mCnc: >398 mg/dL
Hgb A1c MFr Bld: >15.5 % — ABNORMAL HIGH (ref 4.8–5.6)

## 2021-10-16 MED ORDER — TRUEPLUS LANCETS 28G MISC
1.0000 | Freq: Three times a day (TID) | 11 refills | Status: AC
  Filled 2021-10-16: qty 100, 25d supply, fill #0

## 2021-10-16 MED ORDER — TRUE METRIX BLOOD GLUCOSE TEST VI STRP
ORAL_STRIP | 11 refills | Status: DC
  Filled 2021-10-16: qty 100, 25d supply, fill #0
  Filled 2021-12-22: qty 100, 33d supply, fill #1
  Filled 2022-06-16: qty 100, 33d supply, fill #2

## 2021-10-16 MED ORDER — INSULIN PEN NEEDLE 32G X 4 MM MISC
0 refills | Status: DC
  Filled 2021-10-16: qty 100, 25d supply, fill #0

## 2021-10-16 MED ORDER — TRUE METRIX METER W/DEVICE KIT
1.0000 | PACK | Freq: Three times a day (TID) | 0 refills | Status: DC
  Filled 2021-10-16: qty 1, 30d supply, fill #0

## 2021-10-16 MED ORDER — BASAGLAR KWIKPEN 100 UNIT/ML ~~LOC~~ SOPN
15.0000 [IU] | PEN_INJECTOR | Freq: Every day | SUBCUTANEOUS | 3 refills | Status: DC
  Filled 2021-10-16 (×2): qty 3, 20d supply, fill #0

## 2021-10-16 NOTE — Progress Notes (Signed)
S:     No chief complaint on file.  Katie Simmons is a 46 y.o. female who presents for diabetes evaluation, education, and management.  PMH is significant for T2DM, NV, cholelithiasis.  Patient was referred and last seen by Primary Care Provider, Gwinda Passe, on 10/15/2021. A1c was > 15% at that visit.   Today, patient arrives in good spirits and presents without any assistance.   Patient reports Diabetes was diagnosed in 9 years. Tells me today that she was diagnosed in [name]. Denies any history of clinical ASCVD. No CHF or CKD history. She does have a history of HTN. She has a history of abodiminal pain and cholelithiasis. Denies any history of pancreatitis. No history of thyroid cancer.   Family/Social History:  Fhx: no hx of heart disease or diabetes  Tobacco: never smoker  Alcohol: none   Current diabetes medications include: Basaglar 26u daily (not taking), emapgliflozin 25 mg daily (not taking), glipizide 10 mg BID (not taking), metformin 1000 mg BID (taking only 1000 mg once daily). **Novolin 70/30 10 units BID (taking 10 units once daily)  Patient denies medication adherence. She has not started the [place], Jardiance or glipizide. She is taking metformin but only 1 tablet once daily and is still taking 10u BID of 70/30 Humulin.  Insurance coverage: none  Patient denies hypoglycemic events.  Reported home fasting blood sugars: not checking   Reported 2 hour post-meal/random blood sugars: not checking   Patient reports nocturia (nighttime urination).  Patient reports neuropathy (nerve pain). Patient reports visual changes. Patient denies self foot exams.   Patient reported dietary habits: Eats 2-3 meals/day Lunch: beans, rice, eggs Dinner: sandwich, eggs, beans, rice, mole  Snacks: denies snacking on sweet foods, candies  Drinks: denies drinking soda. Denies drinking juice. Usually, drinks only water.   Patient-reported exercise  habits: tells me she exercises daily. She is active at home.    O:   ROS  Physical Exam  7 day average blood glucose: no meter to check with   No CGM with her currently.  Lab Results  Component Value Date   HGBA1C >15.5 (H) 10/15/2021   There were no vitals filed for this visit.  Lipid Panel     Component Value Date/Time   CHOL 242 (H) 10/15/2021 1143   TRIG 305 (H) 10/15/2021 1143   HDL 43 10/15/2021 1143   CHOLHDL 5.6 (H) 10/15/2021 1143   LDLCALC 143 (H) 10/15/2021 1143    Clinical Atherosclerotic Cardiovascular Disease (ASCVD): No  The 10-year ASCVD risk score (Arnett DK, et al., 2019) is: 2.9%   Values used to calculate the score:     Age: 23 years     Sex: Female     Is Non-Hispanic African American: No     Diabetic: Yes     Tobacco smoker: No     Systolic Blood Pressure: 123 mmHg     Is BP treated: No     HDL Cholesterol: 43 mg/dL     Total Cholesterol: 242 mg/dL    A/P: Diabetes longstanding  currently uncontrolled. Patient is able to verbalize appropriate hypoglycemia management plan. Medication adherence appears suboptimal. Will simplify her regimen. She tells me if she takes 10u BID of 70/30 she'll feel weak and faint. Will have her stop this and commence Basaglar. For this reason, we will holf off on glipizide for now. Also, with her CBG being this high we should avoid SGLT-2i use to avoid increasing her  risk or UTIs. -Discontinued 70/30.  -Discontinued glipizide.  -Discontinue Jardiance. -Start Basaglar 15u once daily.  -Started rmetformin 1000 mg BID. -True Metrix supplies sent.  -Patient educated on purpose, proper use, and potential adverse effects of Basaglar, metformin.  -Extensively discussed pathophysiology of diabetes, recommended lifestyle interventions, dietary effects on blood sugar control.  -Counseled on s/sx of and management of hypoglycemia.  -Next A1c anticipated 01/2022.   Written patient instructions provided. Patient verbalized  understanding of treatment plan.  Total time in face to face counseling 30 minutes.    Follow-up:  Pharmacist in 1 month.

## 2021-10-17 LAB — MICROALBUMIN, URINE: Microalbumin, Urine: 27.5 ug/mL

## 2021-10-19 ENCOUNTER — Other Ambulatory Visit (INDEPENDENT_AMBULATORY_CARE_PROVIDER_SITE_OTHER): Payer: Self-pay | Admitting: Primary Care

## 2021-10-19 MED ORDER — ATORVASTATIN CALCIUM 80 MG PO TABS
80.0000 mg | ORAL_TABLET | Freq: Every day | ORAL | 3 refills | Status: DC
  Filled 2021-10-19: qty 30, 30d supply, fill #0

## 2021-10-20 ENCOUNTER — Other Ambulatory Visit: Payer: Self-pay

## 2021-10-23 ENCOUNTER — Encounter (INDEPENDENT_AMBULATORY_CARE_PROVIDER_SITE_OTHER): Payer: Self-pay

## 2021-10-27 ENCOUNTER — Other Ambulatory Visit: Payer: Self-pay

## 2021-11-17 ENCOUNTER — Other Ambulatory Visit: Payer: Self-pay | Admitting: Pharmacist

## 2021-11-17 ENCOUNTER — Ambulatory Visit: Payer: Self-pay | Attending: Primary Care | Admitting: Pharmacist

## 2021-11-17 ENCOUNTER — Other Ambulatory Visit: Payer: Self-pay

## 2021-11-17 DIAGNOSIS — E119 Type 2 diabetes mellitus without complications: Secondary | ICD-10-CM

## 2021-11-17 MED ORDER — BASAGLAR KWIKPEN 100 UNIT/ML ~~LOC~~ SOPN
25.0000 [IU] | PEN_INJECTOR | Freq: Every day | SUBCUTANEOUS | 2 refills | Status: DC
  Filled 2021-11-17: qty 9, 36d supply, fill #0

## 2021-11-17 NOTE — Progress Notes (Signed)
    S:    Katie Simmons is a 46 y.o. female who presents for diabetes evaluation, education, and management. PMH is significant for T2DM, NV, cholelithiasis. Patient was referred and last seen by Primary Care Provider, Juluis Mire, on 10/15/2021. A1c was > 15% at that visit. Last seen by pharmacy team on 10/16/2021.  At last visit, patient had not started the [place], Jardiance or glipizide. She is taking metformin but only 1 tablet once daily and is still taking 10u BID of 70/30 Humulin.   Today, patient arrives in good spirits and presents with the assistance of an online interpretor.   Patient reports Diabetes was diagnosed 9 years ago.   Family/Social History:  Fhx: no hx of heart disease or diabetes  Tobacco: never smoker  Alcohol: none   Current diabetes medications include: Basaglar 15 units daily, metformin 1000 mg BID   Patient reports adherence to taking all medications as prescribed.  Insurance coverage: Uninsured  Patient reports hypoglycemic events. 1 isolated event that resolved with drinking coke.   Reported home fasting blood sugars: 250-300  Reported 2 hour post-meal/random blood sugars: 300s.  Patient reports nocturia (nighttime urination).  Patient reports neuropathy (nerve pain). Patient reports visual changes. Patient denies self foot exams.     O:  7 day average blood glucose: did not bring in meter  Lab Results  Component Value Date   HGBA1C >15.5 (H) 10/15/2021   Lipid Panel     Component Value Date/Time   CHOL 242 (H) 10/15/2021 1143   TRIG 305 (H) 10/15/2021 1143   HDL 43 10/15/2021 1143   CHOLHDL 5.6 (H) 10/15/2021 1143   LDLCALC 143 (H) 10/15/2021 1143    Clinical Atherosclerotic Cardiovascular Disease (ASCVD): No  The 10-year ASCVD risk score (Arnett DK, et al., 2019) is: 2.9%   Values used to calculate the score:     Age: 89 years     Sex: Female     Is Non-Hispanic African American: No     Diabetic: Yes     Tobacco  smoker: No     Systolic Blood Pressure: 878 mmHg     Is BP treated: No     HDL Cholesterol: 43 mg/dL     Total Cholesterol: 242 mg/dL   A/P: Diabetes longstanding currently above goal based on A1c. Patient is able to verbalize appropriate hypoglycemia management plan. Medication adherence appears appropriate. Control is suboptimal due to needing therapy intensification. -Increased dose of basal insulin Basaglar (insulin glargine) from 15 to 25 units daily.  -Continued metformin 1000 mg BID.  -Patient educated on purpose, proper use, and potential adverse effects of insulin.  -Extensively discussed pathophysiology of diabetes, recommended lifestyle interventions, dietary effects on blood sugar control.  -Counseled on s/sx of and management of hypoglycemia.  -Next A1c anticipated 01/2022.   Written patient instructions provided. Patient verbalized understanding of treatment plan.  Total time in face to face counseling 20 minutes.    Follow-up:  Pharmacist 1 month. PCP clinic visit on 01/14/2022.   Joseph Art, Pharm.D. PGY-2 Ambulatory Care Pharmacy Resident 11/17/2021 4:04 PM

## 2021-11-20 ENCOUNTER — Other Ambulatory Visit: Payer: Self-pay

## 2021-12-22 ENCOUNTER — Other Ambulatory Visit: Payer: Self-pay

## 2021-12-22 ENCOUNTER — Other Ambulatory Visit (HOSPITAL_COMMUNITY): Payer: Self-pay

## 2021-12-22 ENCOUNTER — Ambulatory Visit: Payer: Self-pay | Attending: Family Medicine | Admitting: Pharmacist

## 2021-12-22 DIAGNOSIS — E119 Type 2 diabetes mellitus without complications: Secondary | ICD-10-CM

## 2021-12-22 MED ORDER — BASAGLAR KWIKPEN 100 UNIT/ML ~~LOC~~ SOPN
35.0000 [IU] | PEN_INJECTOR | Freq: Every day | SUBCUTANEOUS | 2 refills | Status: DC
  Filled 2021-12-22: qty 9, 25d supply, fill #0

## 2021-12-22 MED ORDER — INSULIN LISPRO (1 UNIT DIAL) 100 UNIT/ML (KWIKPEN)
6.0000 [IU] | PEN_INJECTOR | Freq: Every day | SUBCUTANEOUS | 1 refills | Status: DC
  Filled 2021-12-22: qty 3, 28d supply, fill #0

## 2021-12-22 NOTE — Progress Notes (Signed)
S:    Katie Simmons is a 46 y.o. female who presents for diabetes evaluation, education, and management. PMH is significant for T2DM, NV, cholelithiasis. Patient was referred and last seen by Primary Care Provider, Gwinda Passe, on 10/15/2021. A1c was > 15% at that visit. Last seen by pharmacy team on 11/15/2021.  At last visit, patient reported BG in the 250-300s. Basaglar was increased from 15 to 25 units.   Today, patient arrives in good spirits and presents with the assistance of an online interpretor. She continues to administer insulin very close to her navel. Re-educated insulin administration should be at least 2 inches from navel. We spoke about the severity of her disease over the years (lowest A1c being 12.7% in May 2017). She is tearful while discussing starting meal time insulin. She request being continued on basaglar and metformin only with promised improvement to metformin adherence; however, we spoke that increased adherence to metformin will not be enough to take her A1c from >15.5% to 7%. Discussed that we are running out of insulin-alternative options given her long standing history of severely uncontrolled diabetes.   Patient reports Diabetes was diagnosed 9 years ago.   Family/Social History:  Fhx: no hx of heart disease or diabetes  Tobacco: never smoker  Alcohol: none   Current diabetes medications include: Basaglar 25 units daily, metformin 1000 mg BID  Previous diabetes medications used: Jardiance (nonadherent), glipizide (non adherent)   Patient denies adherence to taking all medications as prescribed. She often forgets to take metformin.   Insurance coverage: Uninsured  Patient reports hypoglycemic events.  Reported home fasting blood sugars: 300s  Patient reports nocturia (nighttime urination).  Patient reports neuropathy (nerve pain). Patient reports visual changes. Patient denies self foot exams.    Patient reported dietary  habits: Eggs, beans, tortilla (6 tortillas a day), bread   O:  7 day average blood glucose: checks occasionally, readings in meter all >300 mg/dL   Lab Results  Component Value Date   HGBA1C >15.5 (H) 10/15/2021   Lipid Panel     Component Value Date/Time   CHOL 242 (H) 10/15/2021 1143   TRIG 305 (H) 10/15/2021 1143   HDL 43 10/15/2021 1143   CHOLHDL 5.6 (H) 10/15/2021 1143   LDLCALC 143 (H) 10/15/2021 1143    Clinical Atherosclerotic Cardiovascular Disease (ASCVD): No  The 10-year ASCVD risk score (Arnett DK, et al., 2019) is: 2.9%   Values used to calculate the score:     Age: 35 years     Sex: Female     Is Non-Hispanic African American: No     Diabetic: Yes     Tobacco smoker: No     Systolic Blood Pressure: 123 mmHg     Is BP treated: No     HDL Cholesterol: 43 mg/dL     Total Cholesterol: 242 mg/dL   A/P: Diabetes longstanding currently above goal based on A1c. Patient is able to verbalize appropriate hypoglycemia management plan. Medication adherence appears appropriate. Control is suboptimal due to needing therapy intensification. -Increased dose of basal insulin Basaglar (insulin glargine) from 25 to 35 units daily.  -Started Humalog 6 units prior to largest meal. After much discussion, she is agreeable to starting Humalog.  -Continued metformin 1000 mg BID.  -Defer initiation of SGLT2i until A1c is closer to goal.  -Patient educated on purpose, proper use, and potential adverse effects of insulin.  -Extensively discussed pathophysiology of diabetes, recommended lifestyle interventions, dietary effects on blood  sugar control.  -Counseled on s/sx of and management of hypoglycemia.  -Next A1c anticipated 01/2022.   Written patient instructions provided. Patient verbalized understanding of treatment plan.  Total time in face to face counseling 20 minutes.    Follow-up:  Pharmacist 6-8 weeks. PCP clinic visit on 01/14/2022.   Valeda Malm, Pharm.D. PGY-2  Ambulatory Care Pharmacy Resident 12/22/2021 3:49 PM

## 2022-01-14 ENCOUNTER — Ambulatory Visit (INDEPENDENT_AMBULATORY_CARE_PROVIDER_SITE_OTHER): Payer: Self-pay | Admitting: Primary Care

## 2022-01-30 ENCOUNTER — Ambulatory Visit (INDEPENDENT_AMBULATORY_CARE_PROVIDER_SITE_OTHER): Payer: Self-pay

## 2022-01-30 NOTE — Telephone Encounter (Signed)
  Chief Complaint: abdominal pain Symptoms: abdominal pain, nausea, diarrhea and HA Frequency: 1 week  Pertinent Negatives: NA Disposition: [] ED /[] Urgent Care (no appt availability in office) / [x] Appointment(In office/virtual)/ []  [facility] [facility]/ [] Home Care/ [] Refused Recommended Disposition /[] [place] [facility]/ []  Follow-up with PCP Additional Notes: pt states 8 days after starting insulins she has started experiencing sx. Pt has appt on 02/03/22 at 1400 with [place], [facility]. Advised pt d/t she thinking this is medication related to keep appt and if needed can schedule appt with , NP but first available is 02/16/22. Pt will keep appt with and speak with him first. Pt advised if sx get worse can go to UC and be seen, pt verbalized understanding.    Reason for Disposition  [1] MILD pain (e.g., does not interfere with normal activities) AND [2] pain comes and goes (cramps) AND [3] present > 48 hours  (Exception: This same abdominal pain is a chronic symptom recurrent or ongoing AND present > 4 weeks.)  Answer Assessment - Initial Assessment Questions 1. LOCATION: "Where does it hurt?"      Stomach and upper chest  3. ONSET: "When did the pain begin?" (e.g., minutes, hours or days ago)      1 week 5. PATTERN "Does the pain come and go, or is it constant?"    - If it comes and goes: "How long does it last?" "Do you have pain now?"     (Note: Comes and goes means the pain is intermittent. It goes away completely between bouts.)    - If constant: "Is it getting better, staying the same, or getting worse?"      (Note: Constant means the pain never goes away completely; most serious pain is constant and gets worse.)      Comes and goes  6. SEVERITY: "How bad is the pain?"  (e.g., Scale 1-10; mild, moderate, or severe)    - MILD (1-3): Doesn't interfere with normal activities, abdomen soft and not tender to touch.     - MODERATE (4-7): Interferes with normal activities or  awakens from sleep, abdomen tender to touch.     - SEVERE (8-10): Excruciating pain, doubled over, unable to do any normal activities.       Some days worse then others  8. CAUSE: "What do you think is causing the stomach pain?"     Started after taking insulin for 8 days  10. OTHER SYMPTOMS: "Do you have any other symptoms?" (e.g., back pain, diarrhea, fever, urination pain, vomiting)       Nausea and diarrhea and HA  Protocols used: Abdominal Pain - Female-A-AH

## 2022-02-03 ENCOUNTER — Ambulatory Visit: Payer: Self-pay | Attending: Primary Care | Admitting: Pharmacist

## 2022-02-03 DIAGNOSIS — E119 Type 2 diabetes mellitus without complications: Secondary | ICD-10-CM

## 2022-02-03 LAB — POCT GLYCOSYLATED HEMOGLOBIN (HGB A1C)

## 2022-02-03 NOTE — Progress Notes (Signed)
S:    Katie Simmons is a 46 y.o. female who presents for diabetes evaluation, education, and management. PMH is significant for T2DM, NV, cholelithiasis. Patient was referred and last seen by Primary Care Provider, Gwinda Passe, on 10/15/2021. A1c was > 15% at that visit.   Last seen by pharmacy team on 12/22/2021. At that visit, we added meal time insulin d/t continued hyperglycemia and intolerance to other medication classes.   Today, patient arrives in good spirits and presents with the assistance of an online interpretor. Unfortunately, she misunderstood instructions given at her appt last month. She's been injecting Basaglar 6u TID and Humalog 35u once daily in the morning. She tells me her morning sugars and pre-prandial lunch sugars have been in the 100s but evening sugars before bed are in the 200-300s.  Family/Social History:  Fhx: no hx of heart disease or diabetes  Tobacco: never smoker  Alcohol: none   Current diabetes medications include: Basaglar 35 units daily (pt is taking 6u TID), Humalog 6u before largest meal (taking 25u once daily in the morning), metformin 1000 mg BID    Insurance coverage: Uninsured  Patient denies hypoglycemic events.  Reported home fasting blood sugars: 130s-140s Reported home post-prandial sugars: 200s-300s  Patient reports nocturia (nighttime urination).  Patient reports neuropathy (nerve pain). Patient reports visual changes. Patient denies self foot exams.    Patient reported dietary habits: Eggs, beans, tortilla (6 tortillas a day), bread   O:  7 day average blood glucose: checks occasionally, readings in meter all >300 mg/dL   Lab Results  Component Value Date   HGBA1C  02/03/2022     Comment:     >15%   Lipid Panel     Component Value Date/Time   CHOL 242 (H) 10/15/2021 1143   TRIG 305 (H) 10/15/2021 1143   HDL 43 10/15/2021 1143   CHOLHDL 5.6 (H) 10/15/2021 1143   LDLCALC 143 (H) 10/15/2021 1143     Clinical Atherosclerotic Cardiovascular Disease (ASCVD): No  The 10-year ASCVD risk score (Arnett DK, et al., 2019) is: 3.1%   Values used to calculate the score:     Age: 43 years     Sex: Female     Is Non-Hispanic African American: No     Diabetic: Yes     Tobacco smoker: No     Systolic Blood Pressure: 123 mmHg     Is BP treated: No     HDL Cholesterol: 43 mg/dL     Total Cholesterol: 242 mg/dL   A/P: Diabetes longstanding currently above goal based on A1c. A1c today is severely elevated. Patient is able to verbalize appropriate hypoglycemia management plan. Medication adherence is not good. This has been a longstanding issue with her. We spent >30 minutes today counseling her on proper use of her insulin. She verbalized the proper dose using teach back. I also pulled up pictures of the Basaglar and Humalog pen on my phone and she was able to identify which one corresponded with the appropriate dosing schedule.   -Continued Basaglar (insulin glargine) 35 units daily.  -Continued Humalog 6 units TID prior to meals. -Continued metformin 1000 mg BID.  -Patient educated on purpose, proper use, and potential adverse effects of insulin.  -Extensively discussed pathophysiology of diabetes, recommended lifestyle interventions, dietary effects on blood sugar control.  -Counseled on s/sx of and management of hypoglycemia.  -Next A1c anticipated 04/2022.   Written patient instructions provided. Patient verbalized understanding of treatment plan.  Total time in face to face counseling 20 minutes.    Follow-up:  Set her up to est care with Dr. Alvis Lemmings tomorrow.   Butch Penny, PharmD, P[place]y [place], CPP Clinical Pharmacist [facility] & [facility] [phone]

## 2022-02-04 ENCOUNTER — Other Ambulatory Visit: Payer: Self-pay

## 2022-02-04 ENCOUNTER — Ambulatory Visit: Payer: Self-pay | Attending: Family Medicine | Admitting: Family Medicine

## 2022-02-04 ENCOUNTER — Encounter: Payer: Self-pay | Admitting: Family Medicine

## 2022-02-04 VITALS — BP 100/70 | HR 91 | Temp 98.3°F | Ht <= 58 in | Wt 121.2 lb

## 2022-02-04 DIAGNOSIS — L0291 Cutaneous abscess, unspecified: Secondary | ICD-10-CM

## 2022-02-04 DIAGNOSIS — E119 Type 2 diabetes mellitus without complications: Secondary | ICD-10-CM

## 2022-02-04 DIAGNOSIS — B349 Viral infection, unspecified: Secondary | ICD-10-CM

## 2022-02-04 DIAGNOSIS — B309 Viral conjunctivitis, unspecified: Secondary | ICD-10-CM

## 2022-02-04 DIAGNOSIS — J301 Allergic rhinitis due to pollen: Secondary | ICD-10-CM

## 2022-02-04 LAB — POCT GLYCOSYLATED HEMOGLOBIN (HGB A1C): HbA1c POC (<> result, manual entry): >15 % (ref 4.0–5.6)

## 2022-02-04 LAB — GLUCOSE, POCT (MANUAL RESULT ENTRY): POC Glucose: 536 mg/dl — AB (ref 70–99)

## 2022-02-04 MED ORDER — FLUTICASONE PROPIONATE 50 MCG/ACT NA SUSP
2.0000 | Freq: Every day | NASAL | 1 refills | Status: DC
  Filled 2022-02-04: qty 16, 30d supply, fill #0
  Filled 2022-05-11: qty 16, 30d supply, fill #1

## 2022-02-04 MED ORDER — POLYMYXIN B-TRIMETHOPRIM 10000-0.1 UNIT/ML-% OP SOLN
1.0000 [drp] | Freq: Four times a day (QID) | OPHTHALMIC | 0 refills | Status: DC
  Filled 2022-02-04: qty 10, 50d supply, fill #0

## 2022-02-04 NOTE — Progress Notes (Signed)
Lump under left arm. Headaches

## 2022-02-04 NOTE — Progress Notes (Signed)
Subjective:  Patient ID: Katie Simmons, female    DOB: 1975-03-30  Age: 46 y.o. MRN: [id number]  CC: Diabetes   HPI Katie Simmons is a 46 y.o. year old female patient of Juluis Mire with a history of type 2 diabetes mellitus (A1c greater than 15) who presents today for an acute visit.  Interval History: She had seen the clinical pharmacist yesterday and her insulin regimen was adjusted. She has had a lump in her left armpit which popped and she has no drainage at the moment. She has referred pain to her left arm. Also complains about headache, nausea , diarrhea, subjective fever x 8 days. States symptoms are ongoing. She goes on to say diarrhea has been present since her insulin regimen was changed by the Clinical Pharmacist about a month ago.  She has no history of sick contact. Endorses presence of stuffy nostrils.  Her left eye has also been painful and feels like needles sensation in her eyeball.  She has no loss of vision. She is not vaccinated against COVID. Past Medical History:  Diagnosis Date   Diabetes mellitus without complication ([place])     No past surgical history on file.  No family history on file.  Social History   Socioeconomic History   Marital status: Married    Spouse name: Not on file   Number of children: Not on file   Years of education: Not on file   Highest education level: Not on file  Occupational History   Not on file  Tobacco Use   Smoking status: Never   Smokeless tobacco: Never  Substance and Sexual Activity   Alcohol use: No    Alcohol/week: 0.0 standard drinks of alcohol   Drug use: No   Sexual activity: Yes    Birth control/protection: None  Other Topics Concern   Not on file  Social History Narrative   Not on file   Social Determinants of Health   Financial Resource Strain: Not on file  Food Insecurity: Not on file  Transportation Needs: Not on file  Physical Activity: Not on file  Stress: Not on  file  Social Connections: Not on file    Allergies  Allergen Reactions   Penicillins Itching    Has patient had a PCN reaction causing immediate rash, facial/tongue/throat swelling, SOB or lightheadedness with hypotension: No Has patient had a PCN reaction causing severe rash involving mucus membranes or skin necrosis: No Has patient had a PCN reaction that required hospitalization: No Has patient had a PCN reaction occurring within the last 10 years: Yes If all of the above answers are "NO", then may proceed with Cephalosporin use.    Outpatient Medications Prior to Visit  Medication Sig Dispense Refill   acetaminophen (TYLENOL) 500 MG tablet Take 500 mg by mouth every 6 (six) hours as needed for moderate pain or headache.     atorvastatin (LIPITOR) 80 MG tablet Take 1 tablet (80 mg total) by mouth daily. 90 tablet 3   Blood Glucose Monitoring Suppl (TRUE METRIX METER) w/Device KIT Use 3 (three) times daily. 1 kit 0   glucose blood (TRUE METRIX BLOOD GLUCOSE TEST) test strip Use as instructed 100 each 11   Insulin Glargine (BASAGLAR KWIKPEN) 100 UNIT/ML Inject 35 Units into the skin daily after breakfast. 9 mL 2   insulin lispro (HUMALOG KWIKPEN) 100 UNIT/ML KwikPen Inject 6 Units into the skin daily before lunch. 3 mL 1   Insulin Pen Needle 32G  X 4 MM MISC use as directed up to four times daily 100 each 0   loratadine (CLARITIN) 10 MG tablet Take 1 tablet (10 mg total) by mouth daily. 90 tablet 3   metFORMIN (GLUCOPHAGE) 1000 MG tablet Take 1 tablet (1,000 mg total) by mouth 2 (two) times daily with a meal. 180 tablet 3   TRUEplus Lancets 28G MISC Testing 3 (three) times daily. 100 each 11   fluticasone (FLONASE) 50 MCG/ACT nasal spray Place 2 sprays into both nostrils daily. 16 g 6   No facility-administered medications prior to visit.     ROS Review of Systems  Constitutional:  Positive for fever. Negative for activity change and appetite change.  HENT:  Negative for sinus  pressure and sore throat.   Eyes:  Positive for pain and redness.  Respiratory:  Negative for chest tightness, shortness of breath and wheezing.   Cardiovascular:  Negative for chest pain and palpitations.  Gastrointestinal:  Positive for diarrhea and nausea. Negative for abdominal distention, abdominal pain and constipation.  Genitourinary: Negative.   Musculoskeletal: Negative.   Skin:  Positive for rash.  Neurological:  Positive for headaches.  Psychiatric/Behavioral:  Negative for behavioral problems and dysphoric mood.     Objective:  BP 100/70   Pulse 91   Temp 98.3 F (36.8 C) (Oral)   Ht _0  (1.422 m)   Wt 121 lb 3.2 oz (55 kg)   SpO2 97%   BMI 27.17 kg/m      02/04/2022   10:52 AM 10/15/2021   11:27 AM 07/15/2021   11:29 AM  BP/Weight  Systolic BP 622 297 989  Diastolic BP 70 82 76  Wt. (Lbs) 121.2 118.4 116.2  BMI 27.17 kg/m2 26.54 kg/m2 26.05 kg/m2      Physical Exam Constitutional:      Appearance: She is well-developed.  Cardiovascular:     Rate and Rhythm: Normal rate.     Heart sounds: Normal heart sounds. No murmur heard. Pulmonary:     Effort: Pulmonary effort is normal.     Breath sounds: Normal breath sounds. No wheezing or rales.  Chest:     Chest wall: No tenderness.  Abdominal:     General: Bowel sounds are normal. There is no distension.     Palpations: Abdomen is soft. There is no mass.     Tenderness: There is no abdominal tenderness.  Musculoskeletal:        General: Normal range of motion.     Right lower leg: No edema.     Left lower leg: No edema.  Skin:    Comments: Left axillary abscess which is resolving with no discharge  Neurological:     Mental Status: She is alert and oriented to person, place, and time.  Psychiatric:        Mood and Affect: Mood normal.        Latest Ref Rng & Units 10/15/2021   11:43 AM 02/22/2021    1:26 AM 02/21/2021   12:49 AM  CMP  Glucose 70 - 99 mg/dL 345  273  370   BUN 6 - 24 mg/dL _1 Creatinine 0.57 - 1.00 mg/dL 0.53  0.55  0.52   Sodium 134 - 144 mmol/L 133  135  133   Potassium 3.5 - 5.2 mmol/L 4.2  4.2  4.0   Chloride 96 - 106 mmol/L 98  101  102   CO2 20 - 29 mmol/L 21  26  23   Calcium 8.7 - 10.2 mg/dL 8.8  8.8  8.1   Total Protein 6.0 - 8.5 g/dL 7.4     Total Bilirubin 0.0 - 1.2 mg/dL 0.3     Alkaline Phos 44 - 121 IU/L 190     AST 0 - 40 IU/L 38     ALT 0 - 32 IU/L 48       Lipid Panel     Component Value Date/Time   CHOL 242 (H) 10/15/2021 1143   TRIG 305 (H) 10/15/2021 1143   HDL 43 10/15/2021 1143   CHOLHDL 5.6 (H) 10/15/2021 1143   LDLCALC 143 (H) 10/15/2021 1143    CBC    Component Value Date/Time   WBC 7.4 10/15/2021 1143   WBC 7.8 02/22/2021 0126   RBC 4.67 10/15/2021 1143   RBC 4.16 02/22/2021 0126   HGB 15.0 10/15/2021 1143   HCT 44.1 10/15/2021 1143   PLT 309 10/15/2021 1143   MCV 94 10/15/2021 1143   MCH 32.1 10/15/2021 1143   MCH 30.0 02/22/2021 0126   MCHC 34.0 10/15/2021 1143   MCHC 34.2 02/22/2021 0126   RDW 12.6 10/15/2021 1143   LYMPHSABS 3.1 10/15/2021 1143   MONOABS 0.5 02/18/2021 2327   EOSABS 0.1 10/15/2021 1143   BASOSABS 0.1 10/15/2021 1143    Lab Results  Component Value Date   HGBA1C >15.0 02/04/2022    Assessment & Plan:  1. Type 2 diabetes mellitus without complication, without long-term current use of insulin (HCC) Uncontrolled with A1c of greater than 15; goal is less than 7.0 She was seen by the clinical pharmacist yesterday and her regimen adjusted He seems like she would like to transfer her care here and I have assumed her care I will have her follow-up with me in 1 month with her blood sugar log for review Medication nonadherence has been a strong factor in poor control Consider the addition of GLP-1 RA at next visit - POCT glucose (manual entry) - POCT glycosylated hemoglobin (Hb A1C)  2. Viral illness Could explain her symptoms Advised to increase fluid intake, rest, use Tylenol for  headaches  3. Abscess No indication for incision and drainage as she currently has no drainage Advised to apply warm compress  4. Acute viral conjunctivitis of left eye Likely viral but will cover in the event that she has superimposed bacterial infection - trimethoprim-polymyxin b (POLYTRIM) ophthalmic solution; Place 1 drop into the left eye every 6 (six) hours.  Dispense: 10 mL; Refill: 0  5. Seasonal allergic rhinitis due to pollen She is requesting refill of her nasal spray. - fluticasone (FLONASE) 50 MCG/ACT nasal spray; Place 2 sprays into both nostrils daily.  Dispense: 16 g; Refill: 1    Meds ordered this encounter  Medications   trimethoprim-polymyxin b (POLYTRIM) ophthalmic solution    Sig: Place 1 drop into the left eye every 6 (six) hours.    Dispense:  10 mL    Refill:  0   fluticasone (FLONASE) 50 MCG/ACT nasal spray    Sig: Place 2 sprays into both nostrils daily.    Dispense:  16 g    Refill:  1    Follow-up: Return in about 1 month (around 03/07/2022) for Medical conditions with PCP -Juluis Mire., Diabetes follow-up.       Charlott Rakes, MD, FAAFP. [facility] and [place], [place]   02/04/2022, 12:36 PM

## 2022-02-04 NOTE — Patient Instructions (Signed)
Infeccin por adenovirus en los adultos Adenovirus Infection, Adult Los adenovirus son virus comunes que causan muchos tipos de infecciones. Estos virus pueden afectar la nariz, la garganta, la trquea y los pulmones (sistema respiratorio). Tambin McKesson, el [place], los intestinos, la vejiga y Secretary/administrator. El tipo ms frecuente de infeccin por adenovirus es el resfriado comn. La mayora de las infecciones por adenovirus no son graves. Sin embargo, pueden volverse graves en las personas que tienen otro problema de salud que dificulta la lucha contra una infeccin. Cules son las causas? La causa de esta afeccin es el ingreso de un adenovirus en el cuerpo. Esto puede suceder si: Toca una superficie o un objeto que tiene el virus y Engineer, mining se toca la boca, la nariz o los ojos sin haberse lavado las manos. Tiene contacto fsico cercano con alguien que tiene este tipo de infeccin. Esto puede suceder si abraza o Teacher, music mano a Dealer. Respira las gotitas esparcidas en el aire cuando una persona infectada habla, tose o estornuda. Tiene contacto con materia fecal (heces) que contiene el virus. [place] una piscina que no tiene suficiente cantidad de cloro. El cloro es una sustancia qumica que mata grmenes. Los adenovirus pueden vivir fuera del cuerpo durante un Psychologist, forensic. Se transmiten con facilidad de una persona a Theodoro Clock (son contagiosos). Qu incrementa el riesgo? Es ms probable que sufra esta afeccin si: Pasa mucho tiempo en lugares donde hay Yahoo. Estos lugares incluyen escuelas, campamentos de verano, guardera, centros comunitarios y centros de capacitacin para personas que asisten a las fuerzas [place]. Es un Environmental consultant. Su organismo tiene un sistema de defensa dbil (sistema inmunitario). Tiene una enfermedad del sistema respiratorio. Tiene una afeccin cardaca. Cules son los signos o sntomas? Los sntomas de esta afeccin pueden demorar [employer] [address]. Generalmente son similares a los sntomas de la gripe. Los sntomas frecuentes de esta afeccin incluyen los siguientes: Problemas pulmonares y respiratorios, como tos, dificultad para respirar y [place] tapada (congestin nasal) o secrecin nasal. Dolores y [place], como dolor de [place], rigidez en el cuello, dolor de garganta, dolor de odo, congestin en los odos o dolor de [place]. Nuseas y vmitos. Diarrea. Grant Ruts. Problemas en los ojos, como enrojecimiento e inflamacin del ojo (ojo rojo o conjuntivitis). Erupcin cutnea. Los sntomas menos frecuentes incluyen los siguientes: Estar confundido o no saber qu momento del da es, dnde est ni quin es (desorientado). Sangre en la orina o dolor al [employer]. Cmo se diagnostica? Esta afeccin se puede diagnosticar en funcin de los sntomas y de un examen fsico. Es posible que el mdico le solicite estudios para asegurarse de que sus sntomas no sean causados por otro problema. Los estudios pueden [employer] Johnson siguientes: Anlisis de [place]. Anlisis de [place]. Anlisis de heces. Radiografa de trax. Anlisis de tejido o mucosidad de Administrator. Cmo se trata? Esta afeccin desaparece por s sola con el paso del tiempo. Los sntomas se pueden controlar de la siguiente manera: Descansar lo suficiente. Beber ms lquidos de lo habitual. Tomar medicamentos de venta libre para ayudar a Engineer, materials de garganta, la fiebre o el dolor de [place]. Siga estas instrucciones en su casa:  Actividad Descanse en su casa hasta que los sntomas desaparezcan. Retome sus actividades normales como se lo haya indicado el mdico. Pregntele al mdico qu actividades son seguras para usted. Estilo de vida No beba alcohol. No consuma ningn producto que contenga nicotina o tabaco. Estos productos incluyen  cigarrillos, tabaco para Theatre manager y aparatos de vapeo, como los Administrator, Civil Service. Si necesita ayuda para dejar de fumar,  consulte al mdico. Instrucciones generales Beba suficiente lquido para Pharmacologist la orina de color amarillo plido. Use los medicamentos de venta libre y los recetados solamente como se lo haya indicado el mdico. Si tiene dolor de [place], haga grgaras con una mezcla de agua y sal 3 o 4 veces al da, o cuando sea necesario. Para preparar agua con sal, disuelva totalmente de  a 1 cucharadita (de 3 a 6 g) de sal en 1 taza (237 ml) de agua tibia. Cmo se previene?     Los adenovirus generalmente no se eliminan con los productos de limpieza comunes. Tambin Technical brewer en las superficies durante [place]. Para ayudar a evitar una infeccin: Lvese las manos frecuentemente con agua y jabn durante al menos 20 segundos. Use desinfectante para manos si no dispone de [place] y [place]. Cbrase la boca al toser. Cbrase la nariz y la boca cuando estornude. No se toque los ojos, la nariz o la boca sin antes [employer]. Lvese las manos luego de tocarse la cara. Limpie los objetos usados comnmente con frecuencia. No use una piscina que no tenga suficiente cantidad de cloro. Evite el contacto cercano con personas que estn enfermas. No asista a la escuela ni al Aleen Campi cuando est enfermo. No comparta vasos ni utensilios para comer. Dnde obtener ms informacin Centers for Disease Control and Prevention (Centros para el Control y la Prevencin de Event organiser): [email or web address] Comunquese con un mdico si: Sus sntomas continan sin cambios despus de [address]. Sus sntomas empeoran. No puede comer ni beber sin vomitar. Solicite ayuda de inmediato si: Tiene problemas para respirar o respira rpido. Tiene la piel, los labios o las uas de color [place]. Tiene latidos cardacos irregulares o acelerados (palpitaciones). Se siente confundido. Pierde la conciencia. Estos sntomas pueden Customer service manager. Solicite ayuda de inmediato. Llame al 911. No espere a ver si los sntomas  desaparecen. No conduzca por sus propios medios [employer]. Resumen El tipo ms frecuente de infeccin por adenovirus es el resfriado comn. Los adenovirus pueden vivir fuera del cuerpo durante un Psychologist, forensic. Se transmiten con facilidad de una persona a Theodoro Clock (son contagiosos). Esta afeccin desaparece por s sola con el paso del tiempo. Descanse en su casa hasta que los sntomas desaparezcan. Comunquese con un mdico si sus sntomas continan sin cambios despus de [address] o [address]. Esta informacin no tiene Theme park manager el consejo del mdico. Asegrese de hacerle al mdico cualquier pregunta que tenga. Document Revised: 05/22/2021 Document Reviewed: 05/22/2021 Elsevier Patient Education  2023 [employer].

## 2022-02-12 ENCOUNTER — Other Ambulatory Visit: Payer: Self-pay

## 2022-03-03 ENCOUNTER — Encounter: Payer: Self-pay | Admitting: Physician Assistant

## 2022-03-03 ENCOUNTER — Ambulatory Visit: Payer: Self-pay | Admitting: Physician Assistant

## 2022-03-03 VITALS — BP 108/82 | HR 88 | Ht 59.0 in | Wt 122.0 lb

## 2022-03-03 DIAGNOSIS — R739 Hyperglycemia, unspecified: Secondary | ICD-10-CM

## 2022-03-03 DIAGNOSIS — R7309 Other abnormal glucose: Secondary | ICD-10-CM

## 2022-03-03 DIAGNOSIS — N912 Amenorrhea, unspecified: Secondary | ICD-10-CM

## 2022-03-03 DIAGNOSIS — R197 Diarrhea, unspecified: Secondary | ICD-10-CM

## 2022-03-03 DIAGNOSIS — Z3202 Encounter for pregnancy test, result negative: Secondary | ICD-10-CM

## 2022-03-03 DIAGNOSIS — R112 Nausea with vomiting, unspecified: Secondary | ICD-10-CM

## 2022-03-03 LAB — GLUCOSE, POCT (MANUAL RESULT ENTRY): POC Glucose: 314 mg/dl — AB (ref 70–99)

## 2022-03-03 LAB — POCT URINE PREGNANCY: Preg Test, Ur: NEGATIVE

## 2022-03-03 MED ORDER — ONDANSETRON 8 MG PO TBDP: 8.0000 mg | ORAL_TABLET | Freq: Three times a day (TID) | ORAL | 0 refills | Status: DC | PRN

## 2022-03-03 MED ORDER — ONDANSETRON 8 MG PO TBDP
8.0000 mg | ORAL_TABLET | Freq: Once | ORAL | Status: AC
  Administered 2022-03-03: 8 mg via ORAL

## 2022-03-03 NOTE — Progress Notes (Unsigned)
   Established Patient Office Visit  Subjective   Patient ID: Katie Simmons, female    DOB: 11-Jul-1975  Age: 47 y.o. MRN: [id number]  Chief Complaint  Patient presents with   Abdominal Pain    Feeling sick, stomach pain, Vomiting.     States that she started having vomiting last night , today has not eaten, no vomiting since this morning  Diarrhea - 2 hrs was last episode  Was having at the same time Along with headache  Chills / unmeasured fever  No sick contacts Possibly tainted food - meat pie she ate  Staying hydrated   DM - took the metformin  Has not had any insulin today due to feeling poorly  BG yesterday was  Last night 150 This morning 125     BG 314        Past Medical History:  Diagnosis Date   Diabetes mellitus without complication ([place])    Social History   Socioeconomic History   Marital status: Married    Spouse name: Not on file   Number of children: Not on file   Years of education: Not on file   Highest education level: Not on file  Occupational History   Not on file  Tobacco Use   Smoking status: Never   Smokeless tobacco: Never  Substance and Sexual Activity   Alcohol use: No    Alcohol/week: 0.0 standard drinks of alcohol   Drug use: No   Sexual activity: Yes    Birth control/protection: None  Other Topics Concern   Not on file  Social History Narrative   Not on file   Social Determinants of Health   Financial Resource Strain: Not on file  Food Insecurity: Not on file  Transportation Needs: Not on file  Physical Activity: Not on file  Stress: Not on file  Social Connections: Not on file  Intimate Partner Violence: Not on file   No family history on file. Allergies  Allergen Reactions   Penicillins Itching    Has patient had a PCN reaction causing immediate rash, facial/tongue/throat swelling, SOB or lightheadedness with hypotension: No Has patient had a PCN reaction causing severe rash involving  mucus membranes or skin necrosis: No Has patient had a PCN reaction that required hospitalization: No Has patient had a PCN reaction occurring within the last 10 years: Yes If all of the above answers are "NO", then may proceed with Cephalosporin use.    ROS    Objective:     There were no vitals taken for this visit.   Physical Exam      Assessment & Plan:   Problem List Items Addressed This Visit   None   No follow-ups on file.    Loraine Grip Mayers, PA-C

## 2022-03-03 NOTE — Patient Instructions (Signed)
You can use the Zofran every 8 hours as needed to help you with the nausea.  I encourage you to take your dosing of insulin as soon as you are able.  Make sure you are getting plenty of rest and staying very well-hydrated.  I hope that you feel better soon.  Kennieth Rad, PA-C Physician Assistant [email or web address]  Kennieth Rad, PA-C Physician Assistant [facility] [email or web address]   Nuseas y vmitos en los adultos Nausea and Vomiting, Adult Las nuseas se describen como la sensacin de Tree surgeon en el estmago o de que se est por vomitar. Si las nuseas empeoran, pueden provocar vmitos. Los vmitos se producen cuando el contenido del Insurance underwriter con fuerza por la boca como consecuencia de las nuseas. Los vmitos pueden hacerlo sentir dbil y causar deshidratacin. La deshidratacin puede hacerle sentir cansancio, sed, sequedad en la boca y disminucin en la frecuencia con la que orina. Los [employer] y las personas que tienen otras enfermedades o un sistema de defensa (sistema inmunitario) dbil tienen mayor riesgo de sufrir deshidratacin. Es importante tratar la nusea y los vmitos como se lo haya indicado el mdico. Siga estas indicaciones en su casa: Controle sus sntomas para detectar cualquier cambio. Informe al mdico acerca de los cambios. Qu debe comer y beber     Tome una solucin de rehidratacin oral (SRO). Esta es una bebida que se vende en farmacias y tiendas minoristas. En la medida en que pueda, beba lquidos transparentes lentamente y en pequeas cantidades. Los lquidos transparentes incluyen agua, cubitos de hielo, [place] deportivas bajas en caloras y [place] de fruta rebajado con agua (jugo de fruta diluido). En la medida en que pueda, consuma alimentos blandos y fciles de digerir en pequeas cantidades. Estos alimentos  incluyen bananas, compota de [place], arroz, carnes [place], tostadas y galletas saladas. Evite consumir lquidos que contengan mucha azcar o cafena, como bebidas energticas, bebidas deportivas y refrescos. Evite tomar alcohol. Evite los alimentos condimentados o con alto contenido de [place]. Indicaciones generales Use los medicamentos de venta libre y los recetados solamente como se lo haya indicado el mdico. Beba suficiente lquido como para Theatre manager la orina de color amarillo plido. Lvese las manos frecuentemente con agua y jabn durante al menos 20 segundos. Use desinfectante para manos si no dispone de [place] y [place]. Asegrese de que en su hogar todos se laven las manos bien y con frecuencia. Descanse en su casa mientras se recupera. Controle su afeccin para Actuary cambio. Respire de forma lenta y profunda cuando sienta nuseas. Concurra a [place]. Esto es importante. Comunquese con un mdico si: Sus sntomas empeoran. Aparecen nuevos sntomas. Tiene fiebre. No puede beber lquidos sin vomitar. Las nuseas no desaparecen despus de 2 das. Se siente aturdido o mareado. Tiene dolor de [place]. Tiene calambres musculares. Tiene una erupcin cutnea. Siente dolor al [employer]. Solicite ayuda de inmediato si: [employer], el cuello, los brazos o la [place]. Se siente muy dbil o se desmaya. Tiene vmitos persistentes. Vomita y el vmito es de color rojo intenso o se asemeja al poso del caf. Tiene heces (deposiciones) negras o sanguinolentas, o heces de aspecto alquitranado. Siente dolor de cabeza intenso, rigidez en el cuello, o ambas cosas. Tiene dolor intenso, clicos o distensin abdominal. Tiene dificultades para respirar, o respira muy rpido. Su corazn late muy rpidamente. Siente la piel fra y hmeda. Se siente confundido. Tiene signos de deshidratacin, [employer]  siguientes: Orina de color oscuro, muy escasa o falta  de orina. Labios agrietados. Sequedad de boca. Ojos hundidos. Somnolencia. Debilidad. Estos sntomas pueden Sales executive. Solicite ayuda de inmediato. Llame al 911. No espere a ver si los sntomas desaparecen. No conduzca por sus propios medios [employer]. Resumen Las nuseas se describen como la sensacin de Tree surgeon en el estmago o de que se est por vomitar. Si las nuseas empeoran, pueden provocar vmitos. Los vmitos pueden hacerlo sentir dbil y causar deshidratacin. Siga las indicaciones del mdico respecto de las comidas y las bebidas para prevenir la deshidratacin. Use los medicamentos de venta libre y los recetados solamente como se lo haya indicado el mdico. Comunquese con su mdico si los sntomas empeoran o si tiene nuevos sntomas. Concurra a [place]. Esto es importante. Esta informacin no tiene Marine scientist el consejo del mdico. Asegrese de hacerle al mdico cualquier pregunta que tenga. Document Revised: 09/04/2020 Document Reviewed: 09/04/2020 Elsevier Patient Education  [place].

## 2022-03-04 ENCOUNTER — Ambulatory Visit (INDEPENDENT_AMBULATORY_CARE_PROVIDER_SITE_OTHER): Payer: Self-pay | Admitting: Primary Care

## 2022-03-05 ENCOUNTER — Encounter: Payer: Self-pay | Admitting: Family Medicine

## 2022-03-05 ENCOUNTER — Other Ambulatory Visit: Payer: Self-pay

## 2022-03-05 ENCOUNTER — Ambulatory Visit: Payer: Self-pay | Attending: Family Medicine | Admitting: Family Medicine

## 2022-03-05 VITALS — BP 99/69 | HR 84 | Temp 98.3°F | Ht <= 58 in | Wt 123.6 lb

## 2022-03-05 DIAGNOSIS — E785 Hyperlipidemia, unspecified: Secondary | ICD-10-CM | POA: Insufficient documentation

## 2022-03-05 DIAGNOSIS — E119 Type 2 diabetes mellitus without complications: Secondary | ICD-10-CM

## 2022-03-05 DIAGNOSIS — E782 Mixed hyperlipidemia: Secondary | ICD-10-CM

## 2022-03-05 LAB — GLUCOSE, POCT (MANUAL RESULT ENTRY): POC Glucose: 397 mg/dl — AB (ref 70–99)

## 2022-03-05 MED ORDER — ATORVASTATIN CALCIUM 80 MG PO TABS
80.0000 mg | ORAL_TABLET | Freq: Every day | ORAL | 1 refills | Status: DC
  Filled 2022-03-05: qty 90, 90d supply, fill #0

## 2022-03-05 MED ORDER — SEMAGLUTIDE(0.25 OR 0.5MG/DOS) 2 MG/3ML ~~LOC~~ SOPN
0.2500 mg | PEN_INJECTOR | SUBCUTANEOUS | 6 refills | Status: DC
  Filled 2022-03-05: qty 3, 56d supply, fill #0
  Filled 2022-03-05: qty 2, 70d supply, fill #0

## 2022-03-05 MED ORDER — BASAGLAR KWIKPEN 100 UNIT/ML ~~LOC~~ SOPN
40.0000 [IU] | PEN_INJECTOR | Freq: Every day | SUBCUTANEOUS | 3 refills | Status: DC
  Filled 2022-03-05: qty 15, 37d supply, fill #0

## 2022-03-05 MED ORDER — INSULIN LISPRO (1 UNIT DIAL) 100 UNIT/ML (KWIKPEN)
0.0000 [IU] | PEN_INJECTOR | Freq: Three times a day (TID) | SUBCUTANEOUS | 1 refills | Status: DC
Start: 2022-03-05 — End: 2022-12-17
  Filled 2022-03-05: qty 3, 9d supply, fill #0

## 2022-03-05 NOTE — Progress Notes (Signed)
Subjective:  Patient ID: Katie Simmons, female    DOB: 06-Jun-1975  Age: 47 y.o. MRN: [id number]  CC: Diabetes   HPI Katie Simmons Katie Simmons Katie Simmons is a 47 y.o. year old female with a history of Type 2 diabetes mellitus (A1c greater than 15), hyperlipidemia.  Interval History:  She endorses administering Basaglar 35 units qhs, Humalog 6 units tid. CBG is 397 and she states her last meal was 2 hours ago Blood sugars at home have been fluctuating between 190 and 300 fasting. She also endorses some hypoglycemic episodes where she states she passed out and her family members had to give her some Coke to drink.  She does not give any blood sugar readings at the time when she had those experiences.  Past Medical History:  Diagnosis Date   Diabetes mellitus without complication ([place])     History reviewed. No pertinent surgical history.  History reviewed. No pertinent family history.  Social History   Socioeconomic History   Marital status: Married    Spouse name: Not on file   Number of children: Not on file   Years of education: Not on file   Highest education level: Not on file  Occupational History   Not on file  Tobacco Use   Smoking status: Never   Smokeless tobacco: Never  Substance and Sexual Activity   Alcohol use: No    Alcohol/week: 0.0 standard drinks of alcohol   Drug use: No   Sexual activity: Yes    Birth control/protection: None  Other Topics Concern   Not on file  Social History Narrative   Not on file   Social Determinants of Health   Financial Resource Strain: Not on file  Food Insecurity: Not on file  Transportation Needs: Not on file  Physical Activity: Not on file  Stress: Not on file  Social Connections: Not on file    Allergies  Allergen Reactions   Penicillins Itching    Has patient had a PCN reaction causing immediate rash, facial/tongue/throat swelling, SOB or lightheadedness with hypotension: No Has patient had a PCN  reaction causing severe rash involving mucus membranes or skin necrosis: No Has patient had a PCN reaction that required hospitalization: No Has patient had a PCN reaction occurring within the last 10 years: Yes If all of the above answers are "NO", then may proceed with Cephalosporin use.    Outpatient Medications Prior to Visit  Medication Sig Dispense Refill   acetaminophen (TYLENOL) 500 MG tablet Take 500 mg by mouth every 6 (six) hours as needed for moderate pain or headache. (Patient not taking: Reported on 03/03/2022)     Blood Glucose Monitoring Suppl (TRUE METRIX METER) w/Device KIT Use 3 (three) times daily. (Patient not taking: Reported on 03/05/2022) 1 kit 0   fluticasone (FLONASE) 50 MCG/ACT nasal spray Place 2 sprays into both nostrils daily. (Patient not taking: Reported on 03/03/2022) 16 g 1   glucose blood (TRUE METRIX BLOOD GLUCOSE TEST) test strip Use as instructed (Patient not taking: Reported on 03/05/2022) 100 each 11   Insulin Pen Needle 32G X 4 MM MISC use as directed up to four times daily (Patient not taking: Reported on 03/05/2022) 100 each 0   loratadine (CLARITIN) 10 MG tablet Take 1 tablet (10 mg total) by mouth daily. (Patient not taking: Reported on 03/05/2022) 90 tablet 3   metFORMIN (GLUCOPHAGE) 1000 MG tablet Take 1 tablet (1,000 mg total) by mouth 2 (two) times daily with a meal. (Patient  not taking: Reported on 03/05/2022) 180 tablet 3   ondansetron (ZOFRAN-ODT) 8 MG disintegrating tablet Take 1 tablet (8 mg total) by mouth every 8 (eight) hours as needed for nausea or vomiting. (Patient not taking: Reported on 03/05/2022) 20 tablet 0   trimethoprim-polymyxin b (POLYTRIM) ophthalmic solution Place 1 drop into the left eye every 6 (six) hours. (Patient not taking: Reported on 03/05/2022) 10 mL 0   TRUEplus Lancets 28G MISC Testing 3 (three) times daily. (Patient not taking: Reported on 03/05/2022) 100 each 11   atorvastatin (LIPITOR) 80 MG tablet Take 1 tablet (80 mg  total) by mouth daily. (Patient not taking: Reported on 03/03/2022) 90 tablet 3   Insulin Glargine (BASAGLAR KWIKPEN) 100 UNIT/ML Inject 35 Units into the skin daily after breakfast. (Patient not taking: Reported on 03/05/2022) 9 mL 2   insulin lispro (HUMALOG KWIKPEN) 100 UNIT/ML KwikPen Inject 6 Units into the skin daily before lunch. (Patient not taking: Reported on 03/05/2022) 3 mL 1   No facility-administered medications prior to visit.     ROS Review of Systems  Constitutional:  Negative for activity change and appetite change.  HENT:  Negative for sinus pressure and sore throat.   Respiratory:  Negative for chest tightness, shortness of breath and wheezing.   Cardiovascular:  Negative for chest pain and palpitations.  Gastrointestinal:  Negative for abdominal distention, abdominal pain and constipation.  Genitourinary: Negative.   Musculoskeletal: Negative.   Psychiatric/Behavioral:  Negative for behavioral problems and dysphoric mood.     Objective:  BP 99/69   Pulse 84   Temp 98.3 F (36.8 C) (Oral)   Ht 4\' 8"  (1.422 m)   Wt 123 lb 9.6 oz (56.1 kg)   LMP 01/01/2022   SpO2 99%   BMI 27.71 kg/m      03/05/2022   10:32 AM 03/03/2022    4:10 PM 02/04/2022   10:52 AM  BP/Weight  Systolic BP 99 123XX123 123XX123  Diastolic BP 69 82 70  Wt. (Lbs) 123.6 122 121.2  BMI 27.71 kg/m2 24.64 kg/m2 27.17 kg/m2      Physical Exam Constitutional:      Appearance: She is well-developed.  Cardiovascular:     Rate and Rhythm: Normal rate.     Heart sounds: Normal heart sounds. No murmur heard. Pulmonary:     Effort: Pulmonary effort is normal.     Breath sounds: Normal breath sounds. No wheezing or rales.  Chest:     Chest wall: No tenderness.  Abdominal:     General: Bowel sounds are normal. There is no distension.     Palpations: Abdomen is soft. There is no mass.     Tenderness: There is no abdominal tenderness.  Musculoskeletal:        General: Normal range of motion.      Right lower leg: No edema.     Left lower leg: No edema.  Neurological:     Mental Status: She is alert and oriented to person, place, and time.  Psychiatric:        Mood and Affect: Mood normal.        Latest Ref Rng & Units 10/15/2021   11:43 AM 02/22/2021    1:26 AM 02/21/2021   12:49 AM  CMP  Glucose 70 - 99 mg/dL 345  273  370   BUN 6 - 24 mg/dL 9  14  11    Creatinine 0.57 - 1.00 mg/dL 0.53  0.55  0.52   Sodium 134 -  144 mmol/L 133  135  133   Potassium 3.5 - 5.2 mmol/L 4.2  4.2  4.0   Chloride 96 - 106 mmol/L 98  101  102   CO2 20 - 29 mmol/L 21  26  23    Calcium 8.7 - 10.2 mg/dL 8.8  8.8  8.1   Total Protein 6.0 - 8.5 g/dL 7.4     Total Bilirubin 0.0 - 1.2 mg/dL 0.3     Alkaline Phos 44 - 121 IU/L 190     AST 0 - 40 IU/L 38     ALT 0 - 32 IU/L 48       Lipid Panel     Component Value Date/Time   CHOL 242 (H) 10/15/2021 1143   TRIG 305 (H) 10/15/2021 1143   HDL 43 10/15/2021 1143   CHOLHDL 5.6 (H) 10/15/2021 1143   LDLCALC 143 (H) 10/15/2021 1143    CBC    Component Value Date/Time   WBC 7.4 10/15/2021 1143   WBC 7.8 02/22/2021 0126   RBC 4.67 10/15/2021 1143   RBC 4.16 02/22/2021 0126   HGB 15.0 10/15/2021 1143   HCT 44.1 10/15/2021 1143   PLT 309 10/15/2021 1143   MCV 94 10/15/2021 1143   MCH 32.1 10/15/2021 1143   MCH 30.0 02/22/2021 0126   MCHC 34.0 10/15/2021 1143   MCHC 34.2 02/22/2021 0126   RDW 12.6 10/15/2021 1143   LYMPHSABS 3.1 10/15/2021 1143   MONOABS 0.5 02/18/2021 2327   EOSABS 0.1 10/15/2021 1143   BASOSABS 0.1 10/15/2021 1143    Lab Results  Component Value Date   HGBA1C >15.0 02/04/2022    Assessment & Plan:  1. Type 2 diabetes mellitus without complication, without long-term current use of insulin (HCC) Uncontrolled with A1c of greater than 15, goal is less than 7.0 Unfortunately she has experienced some hypoglycemic episodes and we will have to balance adequate glycemic control against risk of hypoglycemia Basaglar increased  to 45 units nightly and I have discontinued fixed dose Humalog and placed on sliding scale Humalog instead.  For blood sugars 0-150 give 0 units of insulin, 151-200 give 2 units of insulin, 201-250 give 4 units, 251-300 give 6 units, 301-350 give 8 units, 351-400 give 10 units,> 400 give 12 units and call M.D. Discussed hypoglycemia protocol. Advised to notify the clinic if hypoglycemia occurs Will initiate GLP-1 RA as well I will review her blood sugar log at her next visit. Counseled on Diabetic diet, my plate method, 063 minutes of moderate intensity exercise/week Blood sugar logs with fasting goals of 80-120 mg/dl, random of less than 180 and in the event of sugars less than 60 mg/dl or greater than 400 mg/dl encouraged to notify the clinic. Advised on the need for annual eye exams, annual foot exams, Pneumonia vaccine. - POCT glucose (manual entry) - Insulin Glargine (BASAGLAR KWIKPEN) 100 UNIT/ML; Inject 40 Units into the skin at bedtime.  Dispense: 15 mL; Refill: 3 - Semaglutide,0.25 or 0.5MG /DOS, 2 MG/3ML SOPN; Inject 0.25 mg into the skin once a week.  Dispense: 3 mL; Refill: 6 - Microalbumin/Creatinine Ratio, Urine - insulin lispro (HUMALOG KWIKPEN) 100 UNIT/ML KwikPen; Inject 0-12 Units into the skin 3 (three) times daily. Per sliding scale.For blood sugars 0-150 give 0 units of insulin, 151-200 give 2 units of insulin, 201-250 give 4 units, 251-300 give 6 units, 301-350 give 8 units, 351-400 give 10 units,> 400 give 12 units  Dispense: 3 mL; Refill: 1 - CMP14+EGFR  2. Mixed  hyperlipidemia Uncontrolled She has not been taking statin and has been advised to restart it. - atorvastatin (LIPITOR) 80 MG tablet; Take 1 tablet (80 mg total) by mouth daily.  Dispense: 90 tablet; Refill: 1    Meds ordered this encounter  Medications   Insulin Glargine (BASAGLAR KWIKPEN) 100 UNIT/ML    Sig: Inject 40 Units into the skin at bedtime.    Dispense:  15 mL    Refill:  3   Semaglutide,0.25 or  0.5MG /DOS, 2 MG/3ML SOPN    Sig: Inject 0.25 mg into the skin once a week.    Dispense:  3 mL    Refill:  6   insulin lispro (HUMALOG KWIKPEN) 100 UNIT/ML KwikPen    Sig: Inject 0-12 Units into the skin 3 (three) times daily. Per sliding scale.For blood sugars 0-150 give 0 units of insulin, 151-200 give 2 units of insulin, 201-250 give 4 units, 251-300 give 6 units, 301-350 give 8 units, 351-400 give 10 units,> 400 give 12 units    Dispense:  3 mL    Refill:  1    For blood sugars 0-150 give 0 units of insulin, 151-200 give 2 units of insulin, 201-250 give 4 units, 251-300 give 6 units, 301-350 give 8 units, 351-400 give 10 units,> 400 give 12 units   atorvastatin (LIPITOR) 80 MG tablet    Sig: Take 1 tablet (80 mg total) by mouth daily.    Dispense:  90 tablet    Refill:  1    Follow-up: Return in about 1 month (around 04/05/2022) for Diabetes.       Hoy Register, MD, FAAFP. [facility] [place], [place] [phone]   03/05/2022, 1:18 PM

## 2022-03-05 NOTE — Patient Instructions (Signed)
Semaglutide Injection Qu es este medicamento? La SEMAGLUTIDA trata la diabetes tipo 2. Acta aumentando los niveles de insulina en el cuerpo, lo cual disminuye el azcar en la sangre (glucosa). Tambin reduce la cantidad de azcar liberada en la sangre y desacelera la digestin. Tambin se puede usar para disminuir el riesgo de ataque cardaco y accidente cerebrovascular en personas con diabetes tipo 2. Con frecuencia, este medicamento se combina con cambios en la dieta y el ejercicio. Este medicamento puede ser utilizado para otros usos; si tiene alguna pregunta consulte con su proveedor de atencin mdica o con su farmacutico. MARCAS COMUNES: OZEMPIC Qu le debo informar a mi profesional de la salud antes de tomar este medicamento? Necesitan saber si usted presenta alguno de los siguientes problemas o situaciones: Tumores endocrinos (neoplasia endcrina mltiple tipo II) o si alguien en su familiar tuvo esos tumores Enfermedad ocular, problemas de la visin Antecedentes de pancreatitis Enfermedad renal Problemas estomacales Cncer de tiroides o si alguien en su familia tuvo cncer de tiroides Una reaccin alrgica o inusual a la semaglutida, a otros medicamentos, alimentos, colorantes o conservantes Si est embarazada o buscando quedar embarazada Si est amamantando a un beb Cmo debo utilizar este medicamento? Este medicamento se debe inyectar debajo de la piel de la parte superior de la pierna (muslo), del rea del estmago o de la parte superior del brazo. Se administra una vez por semana (cada 7 das). Le ensearn cmo preparar y administrar este medicamento. Use el medicamento exactamente como se le indique. Use su medicamento a intervalos regulares. No lo use con una frecuencia mayor a la indicada. Si usa este medicamento con insulina, debe inyectar este medicamento y la insulina por separado. No los mezcle. No se aplique una inyeccin al lado de la otra. Cambie (rote) los sitios de  inyeccin con cada inyeccin. Es importante que deseche las agujas y las jeringas usadas en un recipiente resistente a los pinchazos. No las deseche en la basura. Si no tiene un recipiente resistente a los pinchazos, llame a su farmacutico o a su equipo de atencin para obtenerlo. Su farmacutico le dar una Gua del medicamento especial (MedGuide, nombre en ingls) con cada receta y en cada ocasin que la vuelva a surtir. Asegrese de leer esta informacin cada vez cuidadosamente. Este medicamento viene con INSTRUCCIONES DE USO. Pdale a su farmacutico que le indique cmo usar este medicamento. Lea la informacin atentamente. Hable con su farmacutico o su equipo de atencin si tiene alguna pregunta. Hable con su equipo de atencin sobre el uso de este medicamento en nios. Puede requerir atencin especial. Sobredosis: Pngase en contacto inmediatamente con un centro toxicolgico o una sala de urgencia si usted cree que haya tomado demasiado medicamento. ATENCIN: Este medicamento es solo para usted. No comparta este medicamento con nadie. Qu sucede si me olvido de una dosis? Si se olvida una dosis, tmela lo antes posible, si es dentro de los 5 das despus de la fecha en que deba tomarla. Luego tome la prxima dosis en el horario semanal habitual. Si han pasado ms de 5 das despus de olvidarse la dosis, no tome la dosis que se olvid. Administre la prxima dosis a la hora habitual. No se administre dosis adicionales o dobles. Si tiene preguntas sobre una dosis que se olvid, contacte a su equipo de atencin para que le brinde asesoramiento. Qu puede interactuar con este medicamento? Otros medicamentos para la diabetes Muchos medicamentos pueden causar cambios en el nivel de azcar en la sangre. Estos   incluyen: Bebidas con alcohol Medicamentos antivirales para el VIH o SIDA Aspirina y otros medicamentos tipo aspirina Ciertos medicamentos para la presin arterial, enfermedad cardiaca y  frecuencia cardiaca irregular Cromo Diurticos Hormonas femeninas, tales como estrgenos o progestinas, pldoras anticonceptivas Fenofibrato Gemfibrozil Isoniazida Lanreotida Hormonas masculinas o esteroides anablicos IMAO, tales como Carbex, Eldepryl, Marplan, Nardil y Parnate Medicamentos para bajar de peso Medicamentos para alergias, asma, resfriados o tos Medicamentos para depresin, ansiedad o trastornos psicticos Niacina Nicotina AINE, medicamentos para el dolor y la inflamacin, tales como ibuprofeno o naproxeno Octreotida Pasireotida Pentamidina Fenitona Probenecid Antibiticos del grupo de las quinolonas, tales como ciprofloxacino, levofloxacino y ofloxacino Algunos suplementos dietticos a base de hierbas Medicamentos esteroideos, tales como la prednisona o la cortisona Sulfametoxazol; trimetoprima Hormonas tiroideas Algunos medicamentos pueden ocultar los sntomas de advertencia de niveles bajos de azcar en la sangre (hipoglucemia). Es posible que deba monitorear ms atentamente su nivel de azcar en la sangre si est tomando uno de estos medicamentos. Estos medicamentos incluyen: Betabloqueadores, que con frecuencia se usan para la presin arterial alta o problemas cardiacos (algunos ejemplos son atenolol, metoprolol y propranolol) Clonidina Guanetidina Reserpina Puede ser que esta lista no menciona todas las posibles interacciones. Informe a su profesional de la salud de todos los productos a base de hierbas, medicamentos de venta libre o suplementos nutritivos que est tomando. Si usted fuma, consume bebidas alcohlicas o si utiliza drogas ilegales, indqueselo tambin a su profesional de la salud. Algunas sustancias pueden interactuar con su medicamento. A qu debo estar atento al usar este medicamento? Visite a su equipo de atencin para que revise su evolucin peridicamente. Beba abundante cantidad de lquidos mientras usa este medicamento. Consulte con su  equipo de atencin si tiene un ataque de diarrea grave, nuseas y vmitos. La prdida de demasiado lquido corporal puede hacer que sea peligroso usar este medicamento. Se monitorizar una prueba llamada Hemoglobina A1C (A1C). Es un anlisis de sangre sencillo. Mide su control del nivel de azcar en la sangre durante los ltimos 2 a 3 meses. Se le realizar esta prueba cada 3 a 6 meses. Aprenda a revisar su nivel de azcar en la sangre. Conozca los sntomas del nivel bajo y alto de azcar en la sangre, y cmo controlarlos. Siempre lleve con usted una fuente rpida de azcar por si tiene sntomas de nivel bajo de azcar en la sangre. Algunos ejemplos incluyen caramelos duros de azcar o tabletas de glucosa. Asegrese de que otras personas sepan que usted se puede ahogar si come o bebe cuando presenta sntomas graves de nivel bajo de azcar en la sangre, como convulsiones o prdida del conocimiento. Deben obtener ayuda mdica de inmediato. Informe a su equipo de atencin si tiene niveles altos de azcar en la sangre. Es posible que deba cambiar la dosis de su medicamento. Si est enfermo o hace ms ejercicio que lo habitual, es posible que necesite cambiar la dosis de su medicamento. No saltee comidas. Pregunte a su equipo de atencin si debe evitar el alcohol. Muchos productos de venta libre para la tos y el resfriado contienen azcar o alcohol. Estos pueden afectar los niveles de azcar en la sangre. Los inyectores nunca deben compartirse. Incluso si se cambia la aguja, al compartir se pueden contagiar virus como la hepatitis o el VIH. Use un brazalete o una cadena de identificacin mdica, y lleve con usted una tarjeta que describa su enfermedad, detalles de su medicamento y horario de dosis. No debe quedar embarazada mientras est tomando este medicamento. Las   mujeres deben informar a su equipo de atencin si estn buscando quedar embarazadas o si creen que podran estar embarazadas. Existe la posibilidad  de efectos secundarios graves en un beb sin nacer. Para obtener ms informacin, hable con su equipo de atencin. Qu efectos secundarios puedo tener al utilizar este medicamento? Efectos secundarios que debe informar a su equipo de atencin tan pronto como sea posible: Reacciones alrgicas: erupcin cutnea, comezn/picazn, urticaria, hinchazn de la cara, los labios, la lengua o la garganta Cambio en la visin Deshidratacin: aumento de la sed, boca seca, sensacin de desmayo o aturdimiento, dolor de cabeza, orina amarilla oscura o marrn Problemas en la vescula biliar: dolor de estmago intenso, nuseas, vmitos, fiebre Palpitaciones cardacas: frecuencia cardiaca rpida, intensa o irregular Lesin en los riones: disminucin en la cantidad de orina, hinchazn de los tobillos, las manos o los pies Pancreatitis: dolor abdominal intenso que se extiende a la espalda o empeora despus de comer o al tacto, fiebre, nuseas, vmitos Cncer de tiroides: masa o bulto nuevo en el cuello, dolor o problemas para tragar, problemas respiratorios, ronquera Efectos secundarios que generalmente no requieren atencin mdica (debe informarlos a su equipo de atencin si persisten o si son molestos): Diarrea Prdida del apetito Nuseas Dolor estomacal Vmito Puede ser que esta lista no menciona todos los posibles efectos secundarios. Comunquese a su mdico por asesoramiento mdico sobre los efectos secundarios. Usted puede informar los efectos secundarios a la FDA por telfono al 1-800-FDA-1088. Dnde debo guardar mi medicina? Mantenga fuera del alcance de los nios. Guarde los inyectores sin abrir en el refrigerador a una temperatura de entre 2 y 8 grados Celsius (entre 36 y 46 grados Fahrenheit). No congele. Proteja de la luz y del calor. Despus de usar el inyector por primera vez, se puede almacenar por 56 das a temperatura ambiente entre 15 y 30 grados Celsius (59 y 86 grados Fahrenheit) o en el  refrigerador. Deseche el inyector usado 56 das despus del primer uso o despus de la fecha de vencimiento, lo que suceda primero. No almacene el inyector con la aguja puesta. Si se deja la aguja puesta, es posible que se escape medicamento del inyector. ATENCIN: Este folleto es un resumen. Puede ser que no cubra toda la posible informacin. Si usted tiene preguntas acerca de esta medicina, consulte con su mdico, su farmacutico o su profesional de la salud.  2023 Elsevier/Gold Standard (2020-08-26 00:00:00)  

## 2022-03-07 LAB — CMP14+EGFR
ALT: 37 IU/L — ABNORMAL HIGH (ref 0–32)
AST: 28 IU/L (ref 0–40)
Albumin/Globulin Ratio: 1.3 (ref 1.2–2.2)
Albumin: 4.1 g/dL (ref 3.9–4.9)
Alkaline Phosphatase: 177 IU/L — ABNORMAL HIGH (ref 44–121)
BUN/Creatinine Ratio: 22 (ref 9–23)
BUN: 13 mg/dL (ref 6–24)
Bilirubin Total: 0.3 mg/dL (ref 0.0–1.2)
CO2: 20 mmol/L (ref 20–29)
Calcium: 9.1 mg/dL (ref 8.7–10.2)
Chloride: 98 mmol/L (ref 96–106)
Creatinine, Ser: 0.58 mg/dL (ref 0.57–1.00)
Globulin, Total: 3.2 g/dL (ref 1.5–4.5)
Glucose: 345 mg/dL — ABNORMAL HIGH (ref 70–99)
Potassium: 4.3 mmol/L (ref 3.5–5.2)
Sodium: 134 mmol/L (ref 134–144)
Total Protein: 7.3 g/dL (ref 6.0–8.5)
eGFR: 113 mL/min/{1.73_m2} (ref >59)

## 2022-03-07 LAB — MICROALBUMIN / CREATININE URINE RATIO
Creatinine, Urine: 21 mg/dL
Microalb/Creat Ratio: 178 mg/g creat — ABNORMAL HIGH (ref 0–29)
Microalbumin, Urine: 37.3 ug/mL

## 2022-03-23 ENCOUNTER — Ambulatory Visit (INDEPENDENT_AMBULATORY_CARE_PROVIDER_SITE_OTHER): Payer: Self-pay | Admitting: Primary Care

## 2022-03-25 ENCOUNTER — Other Ambulatory Visit: Payer: Self-pay

## 2022-04-27 ENCOUNTER — Other Ambulatory Visit: Payer: Self-pay

## 2022-05-01 ENCOUNTER — Other Ambulatory Visit: Payer: Self-pay

## 2022-05-11 ENCOUNTER — Other Ambulatory Visit: Payer: Self-pay

## 2022-06-15 ENCOUNTER — Encounter (HOSPITAL_COMMUNITY): Payer: Self-pay

## 2022-06-15 ENCOUNTER — Other Ambulatory Visit: Payer: Self-pay

## 2022-06-15 ENCOUNTER — Ambulatory Visit (HOSPITAL_COMMUNITY)
Admission: EM | Admit: 2022-06-15 | Discharge: 2022-06-15 | Disposition: A | Discharge status: Home / Self Care | Payer: Self-pay | Attending: Nurse Practitioner | Admitting: Nurse Practitioner

## 2022-06-15 DIAGNOSIS — K0889 Other specified disorders of teeth and supporting structures: Secondary | ICD-10-CM

## 2022-06-15 MED ORDER — CLINDAMYCIN HCL 150 MG PO CAPS
450.0000 mg | ORAL_CAPSULE | Freq: Three times a day (TID) | ORAL | 0 refills | Status: AC
  Filled 2022-06-15: qty 90, 10d supply, fill #0

## 2022-06-15 NOTE — Discharge Instructions (Addendum)
Take the clindamycin to treat infection in your teeth  Please follow-up with a dentist-contact information has been provided

## 2022-06-15 NOTE — ED Provider Notes (Signed)
[facility]    CSN: [id number] Arrival date & time: 06/15/22  1627      History   Chief Complaint Chief Complaint  Patient presents with   Dental Pain    HPI [facility] Katie Simmons is a 47 y.o. female.   Patient presents today for 1 week history of dental pain.  She reports the areas in her gums are red and swollen.  They are not draining.  She reports her mouth feels "feverish."  She denies fever, body aches or chills.  No numbness or tingling in her sinuses or sinus pressure.  Denies history of similar.  Denies recent antibiotic use.  Interpreter was utilized for today's visit.    Past Medical History:  Diagnosis Date   Diabetes mellitus without complication [facility])     Patient Active Problem List   Diagnosis Date Noted   Hyperlipidemia 03/05/2022   Nausea vomiting and diarrhea 02/19/2021   AKI (acute kidney injury) (HCC) 02/19/2021   Hypokalemia 02/19/2021   Hyponatremia 02/19/2021   Cholelithiasis 02/19/2021   Transaminitis 02/19/2021   E coli bacteremia 01/06/2018   Sepsis (HCC) 01/03/2018   Diabetes mellitus without complication (HCC) 01/03/2018   Pyelonephritis    Hypotension     History reviewed. No pertinent surgical history.  OB History   No obstetric history on file.      Home Medications    Prior to Admission medications   Medication Sig Start Date End Date Taking? Authorizing Provider  clindamycin (CLEOCIN) 150 MG capsule Take 3 capsules (450 mg total) by mouth every 8 (eight) hours for 10 days. 06/15/22 06/25/22 Yes Valentino Nose, NP  acetaminophen (TYLENOL) 500 MG tablet Take 500 mg by mouth every 6 (six) hours as needed for moderate pain or headache. Patient not taking: Reported on 03/03/2022    [provider]  atorvastatin (LIPITOR) 80 MG tablet Take 1 tablet (80 mg total) by mouth daily. 03/05/22   Hoy Register, MD  Blood Glucose Monitoring Suppl (TRUE METRIX METER) w/Device KIT Use 3 (three) times  daily. Patient not taking: Reported on 03/05/2022 10/16/21   Hoy Register, MD  fluticasone (FLONASE) 50 MCG/ACT nasal spray Place 2 sprays into both nostrils daily. Patient not taking: Reported on 03/03/2022 02/04/22   Hoy Register, MD  glucose blood (TRUE METRIX BLOOD GLUCOSE TEST) test strip Use as instructed Patient not taking: Reported on 03/05/2022 10/16/21   Hoy Register, MD  Insulin Glargine (BASAGLAR KWIKPEN) 100 UNIT/ML Inject 40 Units into the skin at bedtime. 03/05/22   Hoy Register, MD  insulin lispro (HUMALOG KWIKPEN) 100 UNIT/ML KwikPen Inject 0-12 Units into the skin 3 (three) times daily. Per sliding scale.For blood sugars 0-150 give 0 units of insulin, 151-200 give 2 units of insulin, 201-250 give 4 units, 251-300 give 6 units, 301-350 give 8 units, 351-400 give 10 units,> 400 give 12 units 03/05/22   Hoy Register, MD  Insulin Pen Needle 32G X 4 MM MISC use as directed up to four times daily Patient not taking: Reported on 03/05/2022 10/16/21   Hoy Register, MD  loratadine (CLARITIN) 10 MG tablet Take 1 tablet (10 mg total) by mouth daily. Patient not taking: Reported on 03/05/2022 07/15/21   Grayce Sessions, NP  metFORMIN (GLUCOPHAGE) 1000 MG tablet Take 1 tablet (1,000 mg total) by mouth 2 (two) times daily with a meal. Patient not taking: Reported on 03/05/2022 07/15/21   Grayce Sessions, NP  ondansetron (ZOFRAN-ODT) 8 MG disintegrating tablet Take 1 tablet (  8 mg total) by mouth every 8 (eight) hours as needed for nausea or vomiting. Patient not taking: Reported on 03/05/2022 03/03/22   Mayers, Cari S, PA-C  Semaglutide,0.25 or 0.5MG /DOS, 2 MG/3ML SOPN Inject 0.25 mg into the skin once a week. 03/05/22   Hoy Register, MD  trimethoprim-polymyxin b (POLYTRIM) ophthalmic solution Place 1 drop into the left eye every 6 (six) hours. Patient not taking: Reported on 03/05/2022 02/04/22   Hoy Register, MD  TRUEplus Lancets 28G MISC Testing 3 (three) times daily. Patient not  taking: Reported on 03/05/2022 10/16/21   Hoy Register, MD    Family History History reviewed. No pertinent family history.  Social History Social History   Tobacco Use   Smoking status: Never   Smokeless tobacco: Never  Substance Use Topics   Alcohol use: No    Alcohol/week: 0.0 standard drinks of alcohol   Drug use: No     Allergies   Penicillins   Review of Systems Review of Systems Per HPI  Physical Exam Triage Vital Signs ED Triage Vitals  Enc Vitals Group     BP 06/15/22 1722 117/80     Pulse Rate 06/15/22 1722 79     Resp 06/15/22 1724 18     Temp 06/15/22 1722 98.1 F (36.7 C)     Temp Source 06/15/22 1722 Oral     SpO2 --      Weight --      Height --      Head Circumference --      Peak Flow --      Pain Score --      Pain Loc --      Pain Edu? --      Excl. in GC? --    No data found.  Updated Vital Signs BP 117/80   Pulse 79   Temp 98.1 F (36.7 C) (Oral)   Resp 18   Visual Acuity Right Eye Distance:   Left Eye Distance:   Bilateral Distance:    Right Eye Near:   Left Eye Near:    Bilateral Near:     Physical Exam Vitals and nursing note reviewed.  Constitutional:      General: She is not in acute distress.    Appearance: Normal appearance. She is not ill-appearing, toxic-appearing or diaphoretic.  HENT:     Head: Normocephalic and atraumatic.     Right Ear: External ear normal.     Left Ear: External ear normal.     Nose: Nose normal. No congestion or rhinorrhea.     Mouth/Throat:     Mouth: Mucous membranes are moist. No oral lesions.     Dentition: Dental tenderness and gingival swelling present. No dental caries or dental abscesses.     Tongue: No lesions.     Palate: No mass.     Pharynx: Oropharynx is clear.  Eyes:     General: No scleral icterus.    Extraocular Movements: Extraocular movements intact.  Pulmonary:     Effort: Pulmonary effort is normal. No respiratory distress.  Musculoskeletal:     Cervical  back: Normal range of motion.  Lymphadenopathy:     Cervical: No cervical adenopathy.  Skin:    General: Skin is warm and dry.     Capillary Refill: Capillary refill takes less than 2 seconds.     Coloration: Skin is not jaundiced or pale.  Neurological:     Mental Status: She is alert and oriented to person, place, and  time.  Psychiatric:        Behavior: Behavior is cooperative.      UC Treatments / Results  Labs (all labs ordered are listed, but only abnormal results are displayed) Labs Reviewed - No data to display  EKG   Radiology No results found.  Procedures Procedures (including critical care time)  Medications Ordered in UC Medications - No data to display  Initial Impression / Assessment and Plan / UC Course  I have reviewed the triage vital signs and the nursing notes.  Pertinent labs & imaging results that were available during my care of the patient were reviewed by me and considered in my medical decision making (see chart for details).   Patient is well-appearing, normotensive, afebrile, not tachycardic, not tachypneic, oxygenating well on room air.    1. Dentalgia Given rash reaction to penicillins, will treat with clindamycin 3 times daily for 10 days Supportive care discussed with patient Recommended follow-up with dentist and resource guide given  The patient was given the opportunity to ask questions.  All questions answered to their satisfaction.  The patient is in agreement to this plan.    Final Clinical Impressions(s) / UC Diagnoses   Final diagnoses:  Dentalgia     Discharge Instructions      Take the clindamycin to treat infection in your teeth  Please follow-up with a dentist-contact information has been provided    ED Prescriptions     Medication Sig Dispense Auth. Provider   clindamycin (CLEOCIN) 150 MG capsule Take 3 capsules (450 mg total) by mouth every 8 (eight) hours for 10 days. 90 capsule Valentino Nose, NP       PDMP not reviewed this encounter.   Valentino Nose, NP 06/15/22 1859

## 2022-06-16 ENCOUNTER — Other Ambulatory Visit: Payer: Self-pay

## 2022-09-15 IMAGING — CT CT ABD-PELV W/ CM
2 of 5 series · 16 of 46 positions shown, 18 images · IV contrast (omnipaque)
Comparison: CT abdomen pelvis dated 01/06/2018.

CLINICAL DATA: Abdominal pain.

EXAM:
CT ABDOMEN AND PELVIS WITH CONTRAST
TECHNIQUE: Multidetector CT imaging of the abdomen and pelvis was performed
using the standard protocol following bolus administration of
intravenous contrast.
CONTRAST:  80mL OMNIPAQUE IOHEXOL 300 MG/ML  SOLN

[Series 4: abdomen 5.0 · axial · 0.86mm/px · z∈[+660,+1050]mm · 13 of 90 slices shown, 15 images]
[im 6/90  soft-tissue]
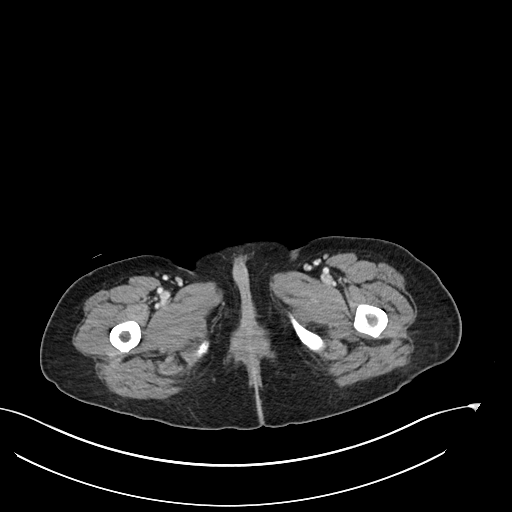
[im 6/90  bone]
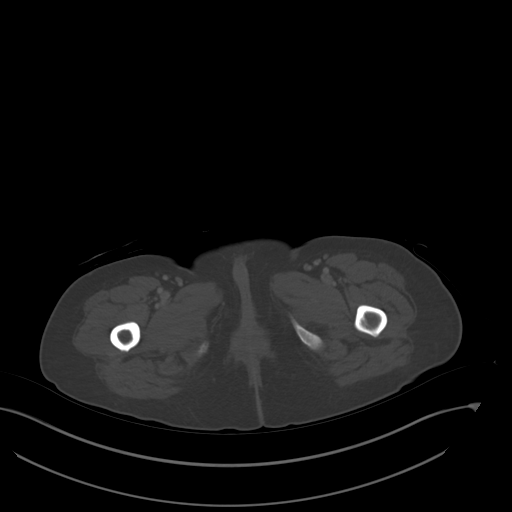
[im 12/90  soft-tissue]
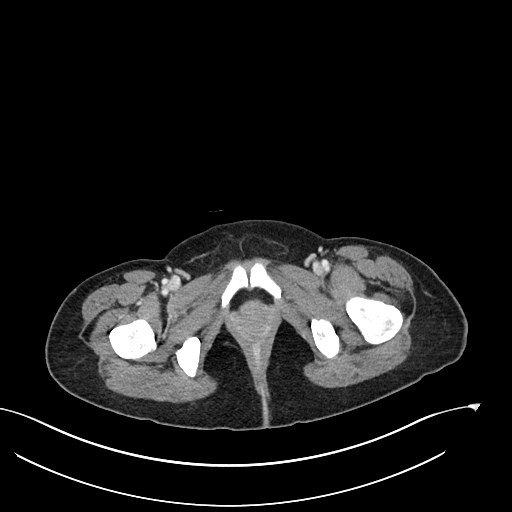
[im 17/90  soft-tissue]
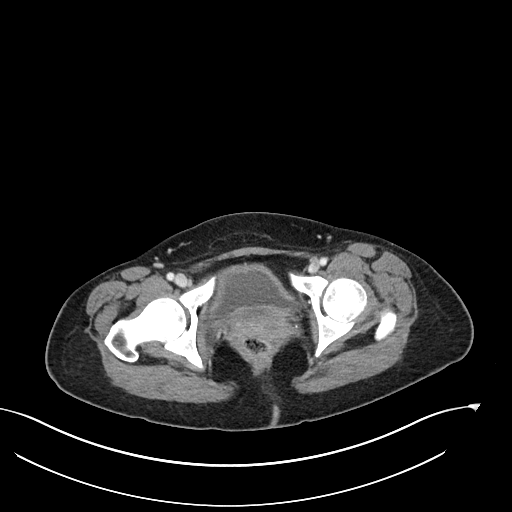
[im 28/90  soft-tissue]
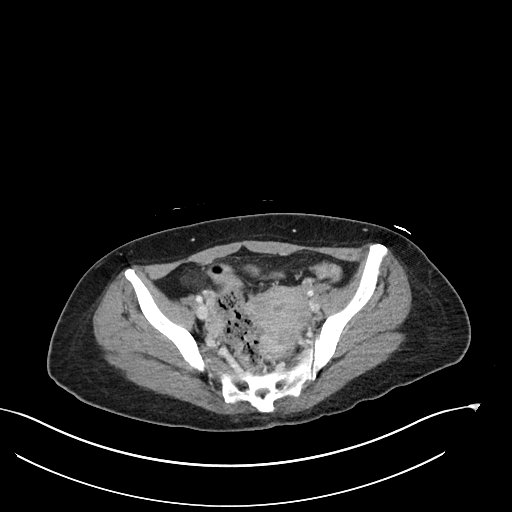
[im 34/90  soft-tissue]
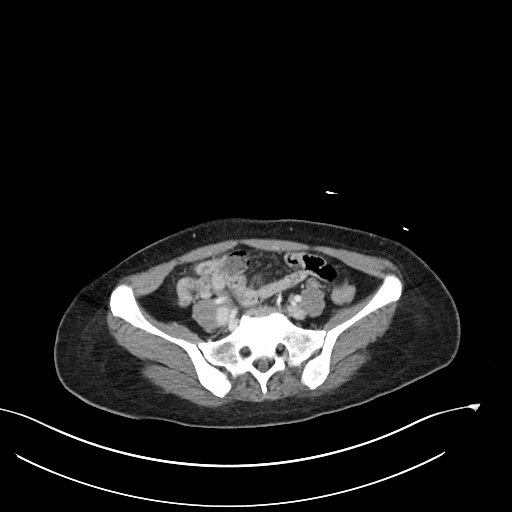
[im 39/90  soft-tissue]
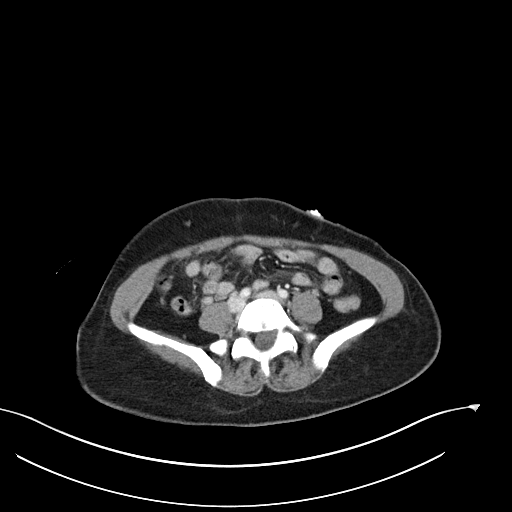
[im 45/90  soft-tissue]
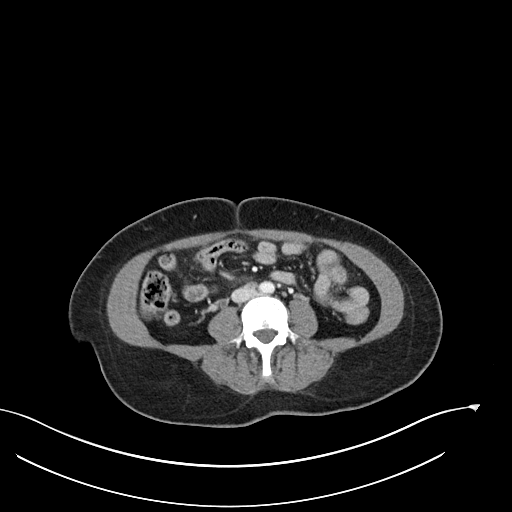
[im 51/90  soft-tissue]
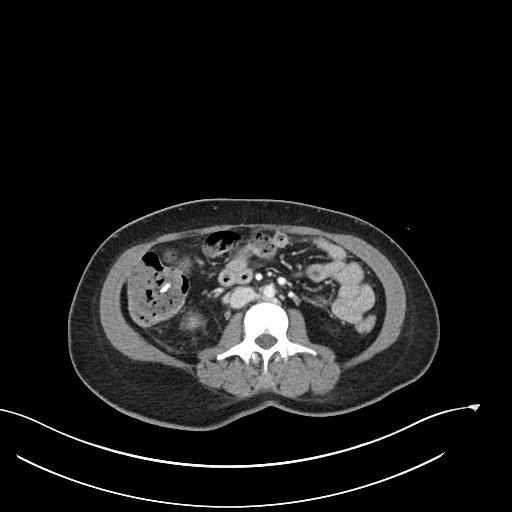
[im 56/90  soft-tissue]
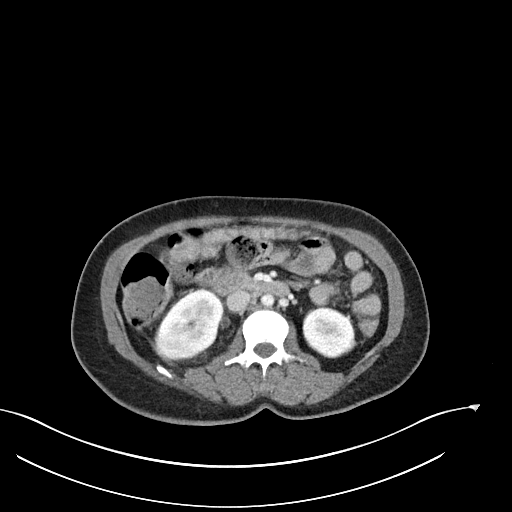
[im 56/90  bone]
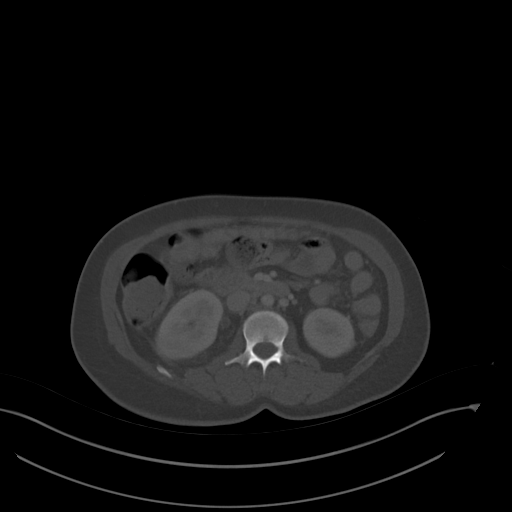
[im 62/90  soft-tissue]
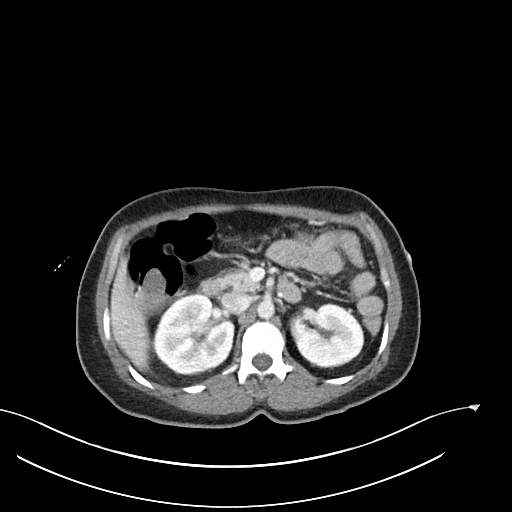
[im 73/90  soft-tissue]
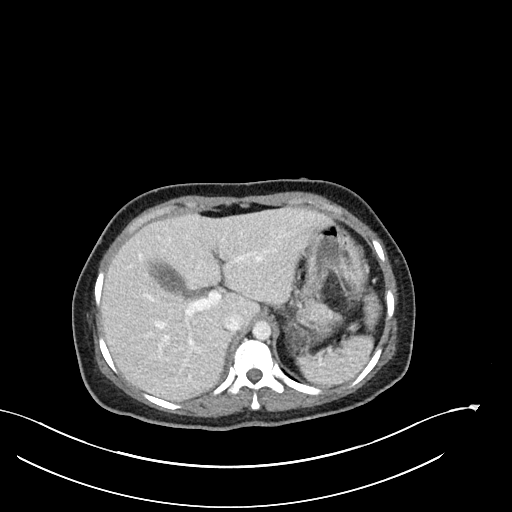
[im 78/90  soft-tissue]
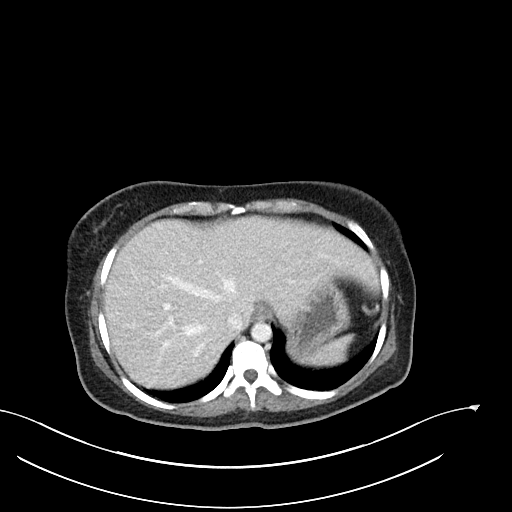
[im 84/90  soft-tissue]
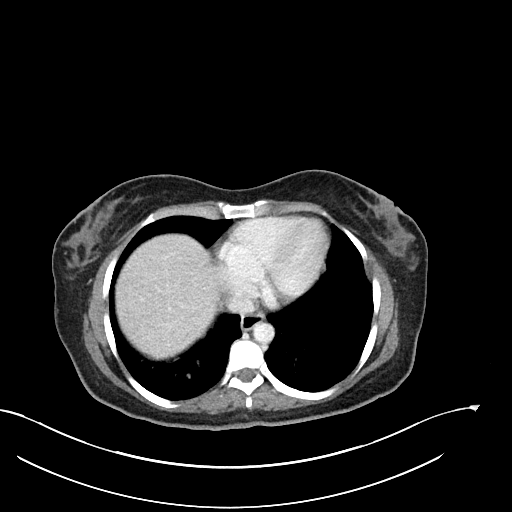

[Series 7: abdomen 3.0 mpr cor · coronal · 0.73mm/px · 3 of 77 slices shown]
[im 26/77  soft-tissue]
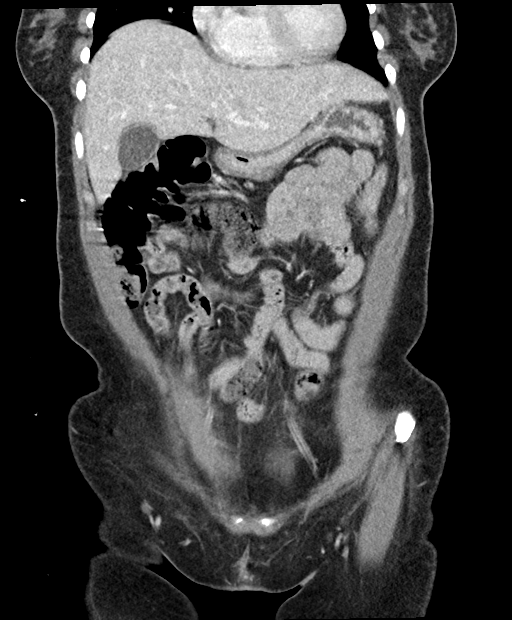
[im 34/77  soft-tissue]
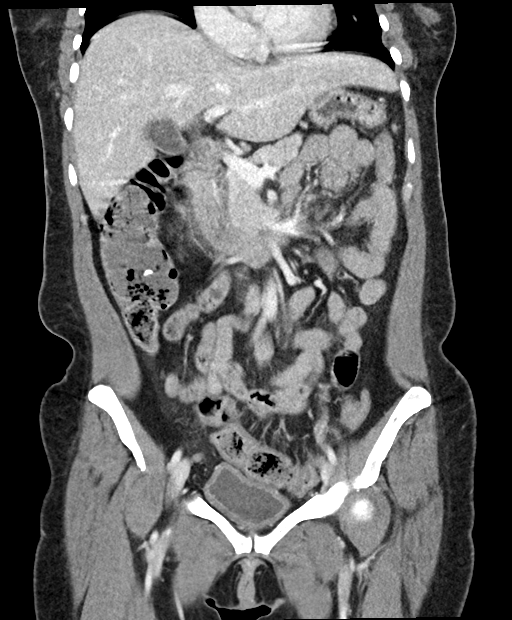
[im 43/77  soft-tissue]
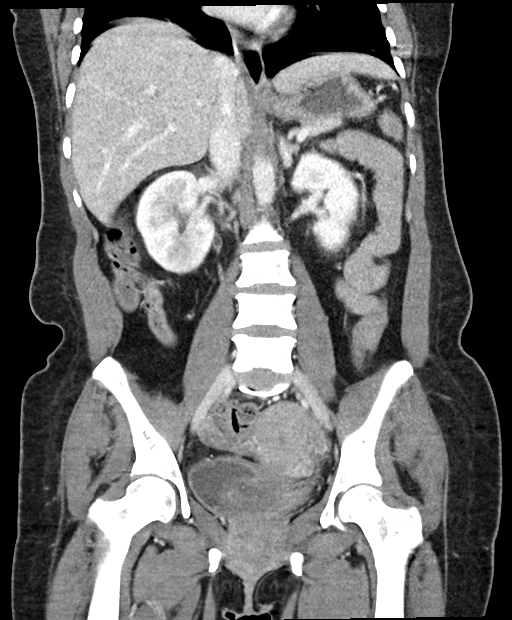

[16 of 46 positions shown; findings below may reference images not displayed]

FINDINGS: Lower chest: The visualized lung bases are clear. Calcified
granuloma at the right lung base.

No intra-abdominal free air or free fluid.

Hepatobiliary: The liver is unremarkable. No intrahepatic biliary
dilatation. There are multiple stones within the gallbladder. There
is mild stranding of the fat plane in the right upper quadrant. This
is likely secondary to right-sided pyelonephritis. Ultrasound may
provide better evaluation of the gallbladder if there is clinical
concern for acute cholecystitis.

Pancreas: Unremarkable. No pancreatic ductal dilatation or
surrounding inflammatory changes.

Spleen: Normal in size without focal abnormality.

Adrenals/Urinary Tract: The adrenal glands unremarkable there is
faint haziness of the right renal parenchyma with heterogeneous
enhancement consistent with pyelonephritis. There is enhancement of
the urothelium of the right ureter. No drainable fluid collection or
abscess. The urinary bladder is grossly unremarkable.

Stomach/Bowel: There is no bowel obstruction or active inflammation.
The appendix is normal.

Vascular/Lymphatic: The abdominal aorta and IVC unremarkable. No
portal venous gas. There is no adenopathy.

Reproductive: The uterus is grossly unremarkable. There is a 3.4 x
3.3 cm posterior uterine fibroid. No adnexal masses.

Other: None

Musculoskeletal: No acute or significant osseous findings.
IMPRESSION: 1. Right-sided pyelonephritis. No drainable fluid collection or
abscess.
2. Cholelithiasis.
3. No bowel obstruction. Normal appendix.
4. Uterine fibroid.

## 2022-12-15 ENCOUNTER — Other Ambulatory Visit: Payer: Self-pay | Admitting: Family Medicine

## 2022-12-15 ENCOUNTER — Other Ambulatory Visit: Payer: Self-pay

## 2022-12-15 DIAGNOSIS — B309 Viral conjunctivitis, unspecified: Secondary | ICD-10-CM

## 2022-12-16 ENCOUNTER — Other Ambulatory Visit: Payer: Self-pay

## 2022-12-16 ENCOUNTER — Telehealth (INDEPENDENT_AMBULATORY_CARE_PROVIDER_SITE_OTHER): Payer: Self-pay

## 2022-12-16 ENCOUNTER — Ambulatory Visit (INDEPENDENT_AMBULATORY_CARE_PROVIDER_SITE_OTHER): Payer: Self-pay | Admitting: Primary Care

## 2022-12-16 NOTE — Telephone Encounter (Signed)
Tried reaching out to pt to reschedule her 3:30pm appointment today because provider will not be in the office. Pt didn't answer and was unable to lvm due to phone kept ringing

## 2022-12-17 ENCOUNTER — Other Ambulatory Visit: Payer: Self-pay

## 2022-12-17 ENCOUNTER — Encounter (INDEPENDENT_AMBULATORY_CARE_PROVIDER_SITE_OTHER): Payer: Self-pay | Admitting: Primary Care

## 2022-12-17 ENCOUNTER — Ambulatory Visit (INDEPENDENT_AMBULATORY_CARE_PROVIDER_SITE_OTHER): Payer: Self-pay | Admitting: Primary Care

## 2022-12-17 VITALS — BP 115/81 | HR 85 | Resp 16 | Ht <= 58 in | Wt 116.4 lb

## 2022-12-17 DIAGNOSIS — Z760 Encounter for issue of repeat prescription: Secondary | ICD-10-CM

## 2022-12-17 DIAGNOSIS — Z7984 Long term (current) use of oral hypoglycemic drugs: Secondary | ICD-10-CM

## 2022-12-17 DIAGNOSIS — Z28310 Unvaccinated for covid-19: Secondary | ICD-10-CM

## 2022-12-17 DIAGNOSIS — Z2821 Immunization not carried out because of patient refusal: Secondary | ICD-10-CM

## 2022-12-17 DIAGNOSIS — E782 Mixed hyperlipidemia: Secondary | ICD-10-CM

## 2022-12-17 DIAGNOSIS — Z7985 Long-term (current) use of injectable non-insulin antidiabetic drugs: Secondary | ICD-10-CM

## 2022-12-17 DIAGNOSIS — Z79899 Other long term (current) drug therapy: Secondary | ICD-10-CM

## 2022-12-17 DIAGNOSIS — Z1211 Encounter for screening for malignant neoplasm of colon: Secondary | ICD-10-CM

## 2022-12-17 DIAGNOSIS — B309 Viral conjunctivitis, unspecified: Secondary | ICD-10-CM

## 2022-12-17 DIAGNOSIS — Z794 Long term (current) use of insulin: Secondary | ICD-10-CM

## 2022-12-17 DIAGNOSIS — E119 Type 2 diabetes mellitus without complications: Secondary | ICD-10-CM

## 2022-12-17 LAB — POCT URINALYSIS DIP (CLINITEK)
Bilirubin, UA: NEGATIVE
Glucose, UA: >=1000 mg/dL — AB
Ketones, POC UA: NEGATIVE mg/dL
Leukocytes, UA: NEGATIVE
Nitrite, UA: NEGATIVE
POC PROTEIN,UA: NEGATIVE
Spec Grav, UA: 1.02 (ref 1.010–1.025)
Urobilinogen, UA: 0.2 U/dL
pH, UA: 6 (ref 5.0–8.0)

## 2022-12-17 LAB — GLUCOSE, POCT (MANUAL RESULT ENTRY)
POC Glucose: 290 mg/dL — AB (ref 70–99)
POC Glucose: 407 mg/dL — AB (ref 70–99)

## 2022-12-17 LAB — POCT GLYCOSYLATED HEMOGLOBIN (HGB A1C)

## 2022-12-17 MED ORDER — BASAGLAR KWIKPEN 100 UNIT/ML ~~LOC~~ SOPN
40.0000 [IU] | PEN_INJECTOR | Freq: Every day | SUBCUTANEOUS | 3 refills | Status: AC
  Filled 2022-12-17: qty 15, 37d supply, fill #0

## 2022-12-17 MED ORDER — INSULIN ASPART 100 UNIT/ML IJ SOLN: 20.0000 [IU] | Freq: Once | INTRAMUSCULAR | Status: AC

## 2022-12-17 MED ORDER — METFORMIN HCL 500 MG PO TABS
500.0000 mg | ORAL_TABLET | Freq: Two times a day (BID) | ORAL | 3 refills | Status: AC
  Filled 2022-12-17: qty 60, 30d supply, fill #0

## 2022-12-17 MED ORDER — SEMAGLUTIDE(0.25 OR 0.5MG/DOS) 2 MG/3ML ~~LOC~~ SOPN
0.2500 mg | PEN_INJECTOR | SUBCUTANEOUS | 6 refills | Status: AC
  Filled 2022-12-17: qty 3, 56d supply, fill #0

## 2022-12-17 NOTE — Progress Notes (Signed)
Established Patient Office Visit  Subjective   Patient ID: Katie Simmons    DOB: 10-12-1975  Age: 47 y.o. 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 Today A1C >15 CBG > 400 given 20 units per provider.  Denies polyuria,  polyphasia or vision changes. She endorses polydipsia .  Does not check blood sugars at home. Unable to state what medication she takes for diabetes.    HPI     Diabetes    Additional comments: [id number] A1c->15.0      Last edited by [name], RMA on 12/17/2022  9:52 AM.      Active Ambulatory Problems    Diagnosis Date Noted   Sepsis (HCC) 01/03/2018   Diabetes mellitus without complication (HCC) 01/03/2018   Pyelonephritis    Hypotension    E coli bacteremia 01/06/2018   Nausea vomiting and diarrhea 02/19/2021   AKI (acute kidney injury) (HCC) 02/19/2021   Hypokalemia 02/19/2021   Hyponatremia 02/19/2021   Cholelithiasis 02/19/2021   Transaminitis 02/19/2021   Hyperlipidemia 03/05/2022   Resolved Ambulatory Problems    Diagnosis Date Noted   No Resolved Ambulatory Problems   No Additional Past Medical History     ROS  Comprehensive ROS Pertinent positive and negative noted in HPI     Health Maintenance  Topic Date Due   FOOT EXAM  Never done   OPHTHALMOLOGY EXAM  Never done   Cervical Cancer Screening (HPV/Pap Cotest)  Never done   Colonoscopy  Never done   HEMOGLOBIN A1C  08/06/2022   COVID-19 Vaccine (1 - 2023-24 season) 01/02/2023 (Originally 10/11/2022)   INFLUENZA VACCINE  05/10/2023 (Originally 09/10/2022)   Diabetic kidney evaluation - eGFR measurement  03/06/2023   Diabetic kidney evaluation - Urine ACR  03/06/2023   Hepatitis C Screening  Completed   HIV Screening  Completed   HPV VACCINES  Aged Out   DTaP/Tdap/Td  Discontinued    Objective:   BP 115/81   Pulse 85   Resp 16   Ht 4\' 8"  (1.422 m)    Wt 116 lb 6.4 oz (52.8 kg)   LMP 12/11/2022   SpO2 100%   BMI 26.10 kg/m    Physical exam: General: Vital signs reviewed.  Patient is well-developed and well-nourished,over weight female  in no acute distress and cooperative with exam. Head: Normocephalic and atraumatic. Eyes: EOMI, conjunctivae normal, no scleral icterus. Neck: Supple, trachea midline, normal ROM, no JVD, masses, thyromegaly, or carotid bruit present. Cardiovascular: RRR, S1 normal, S2 normal, no murmurs, gallops, or rubs. Pulmonary/Chest: Clear to auscultation bilaterally, no wheezes, rales, or rhonchi. Abdominal: Soft, non-tender, non-distended, BS +, no masses, organomegaly, or guarding present. Musculoskeletal: No joint deformities, erythema, or stiffness, ROM full and nontender. Extremities: No lower extremity edema bilaterally,  pulses symmetric and intact bilaterally. No cyanosis or clubbing. Neurological: A&O x3, Strength is normal Skin: Warm, dry and intact. No rashes or erythema. Psychiatric: Normal mood and affect. speech and behavior is normal. Cognition and memory are normal.     No results found for any visits on 12/18/21.    The ASCVD Risk score (Arnett DK, et al., 2019) failed to calculate for the following reasons:   The systolic blood pressure is missing   Cannot find a previous HDL lab   Cannot find a previous total cholesterol lab    Assessment & Plan:  Frady Taddeo was seen today for diabetes.  Diagnoses and all orders for this visit:  Influenza vaccination declined  COVID-19 vaccine series declined  Colon cancer screening -     Fecal occult blood, imunochemical  Mixed hyperlipidemia -     Lipid panel  Medication refill -     Insulin Glargine (BASAGLAR KWIKPEN) 100 UNIT/ML; Inject 40 Units into the skin at bedtime. -     metFORMIN (GLUCOPHAGE) 500 MG tablet; Take 1 tablet (500 mg total) by mouth 2 (two) times daily with a meal. -     Semaglutide,0.25 or 0.5MG /DOS, 2 MG/3ML  SOPN; Inject 0.25 mg into the skin once a week.  Medication management -     CMP14+EGFR  Acute viral conjunctivitis of left eye Not present refilled denied OTC antihistamine eye drops   Type 2 diabetes mellitus without complication, with long-term current use of insulin (HCC)  Complications from uncontrolled diabetes -diabetic retinopathy leading to blindness, diabetic nephropathy leading to dialysis, decrease in circulation decrease in sores or wound healing which may lead to amputations and increase of heart attack and stroke  -     POCT glycosylated hemoglobin (Hb A1C) -     POCT glucose (manual entry) -     Microalbumin / creatinine urine ratio -     CBC with Differential/Platelet -     POCT URINALYSIS DIP (CLINITEK) -     Hemoglobin A1c -     POCT glucose (manual entry) -     insulin aspart (novoLOG) injection 20 Units     Grayce Sessions, NP

## 2022-12-18 LAB — CBC WITH DIFFERENTIAL/PLATELET
Basophils Absolute: 0 10*3/uL (ref 0.0–0.2)
Basos: 1 %
EOS (ABSOLUTE): 0.1 10*3/uL (ref 0.0–0.4)
Eos: 2 %
Hematocrit: 43 % (ref 34.0–46.6)
Hemoglobin: 14.3 g/dL (ref 11.1–15.9)
Immature Grans (Abs): 0 10*3/uL (ref 0.0–0.1)
Immature Granulocytes: 0 %
Lymphocytes Absolute: 3 10*3/uL (ref 0.7–3.1)
Lymphs: 50 %
MCH: 31.2 pg (ref 26.6–33.0)
MCHC: 33.3 g/dL (ref 31.5–35.7)
MCV: 94 fL (ref 79–97)
Monocytes Absolute: 0.3 10*3/uL (ref 0.1–0.9)
Monocytes: 5 %
Neutrophils Absolute: 2.6 10*3/uL (ref 1.4–7.0)
Neutrophils: 42 %
Platelets: 310 10*3/uL (ref 150–450)
RBC: 4.59 x10E6/uL (ref 3.77–5.28)
RDW: 12.3 % (ref 11.7–15.4)
WBC: 6.1 10*3/uL (ref 3.4–10.8)

## 2022-12-18 LAB — CMP14+EGFR
ALT: 27 [IU]/L (ref 0–32)
AST: 26 [IU]/L (ref 0–40)
Albumin: 4.2 g/dL (ref 3.9–4.9)
Alkaline Phosphatase: 162 [IU]/L — ABNORMAL HIGH (ref 44–121)
BUN/Creatinine Ratio: 27 — ABNORMAL HIGH (ref 9–23)
BUN: 16 mg/dL (ref 6–24)
Bilirubin Total: 0.4 mg/dL (ref 0.0–1.2)
CO2: 25 mmol/L (ref 20–29)
Calcium: 8.9 mg/dL (ref 8.7–10.2)
Chloride: 95 mmol/L — ABNORMAL LOW (ref 96–106)
Creatinine, Ser: 0.6 mg/dL (ref 0.57–1.00)
Globulin, Total: 3.3 g/dL (ref 1.5–4.5)
Glucose: 200 mg/dL — ABNORMAL HIGH (ref 70–99)
Potassium: 3.8 mmol/L (ref 3.5–5.2)
Sodium: 133 mmol/L — ABNORMAL LOW (ref 134–144)
Total Protein: 7.5 g/dL (ref 6.0–8.5)
eGFR: 112 mL/min/{1.73_m2} (ref >59)

## 2022-12-18 LAB — MICROALBUMIN / CREATININE URINE RATIO
Creatinine, Urine: 20.7 mg/dL
Microalb/Creat Ratio: 412 mg/g{creat} — ABNORMAL HIGH (ref 0–29)
Microalbumin, Urine: 85.3 ug/mL

## 2022-12-18 LAB — LIPID PANEL
Chol/HDL Ratio: 5.7 ratio — ABNORMAL HIGH (ref 0.0–4.4)
Cholesterol, Total: 233 mg/dL — ABNORMAL HIGH (ref 100–199)
HDL: 41 mg/dL (ref >39)
LDL Chol Calc (NIH): 155 mg/dL — ABNORMAL HIGH (ref 0–99)
Triglycerides: 201 mg/dL — ABNORMAL HIGH (ref 0–149)
VLDL Cholesterol Cal: 37 mg/dL (ref 5–40)

## 2022-12-18 LAB — HEMOGLOBIN A1C
Est. average glucose Bld gHb Est-mCnc: >398 mg/dL
Hgb A1c MFr Bld: >15.5 % — ABNORMAL HIGH (ref 4.8–5.6)

## 2022-12-20 ENCOUNTER — Other Ambulatory Visit (INDEPENDENT_AMBULATORY_CARE_PROVIDER_SITE_OTHER): Payer: Self-pay | Admitting: Primary Care

## 2022-12-20 DIAGNOSIS — E782 Mixed hyperlipidemia: Secondary | ICD-10-CM

## 2022-12-20 MED ORDER — ATORVASTATIN CALCIUM 80 MG PO TABS
80.0000 mg | ORAL_TABLET | Freq: Every day | ORAL | 1 refills | Status: AC
  Filled 2022-12-20: qty 90, 90d supply, fill #0

## 2022-12-21 ENCOUNTER — Other Ambulatory Visit: Payer: Self-pay

## 2022-12-30 ENCOUNTER — Other Ambulatory Visit: Payer: Self-pay

## 2023-06-07 ENCOUNTER — Encounter (HOSPITAL_COMMUNITY): Payer: Self-pay | Admitting: *Deleted

## 2023-06-07 ENCOUNTER — Ambulatory Visit (HOSPITAL_COMMUNITY)
Admission: EM | Admit: 2023-06-07 | Discharge: 2023-06-07 | Disposition: A | Discharge status: Home / Self Care | Payer: Self-pay | Attending: Physician Assistant | Admitting: Physician Assistant

## 2023-06-07 ENCOUNTER — Other Ambulatory Visit: Payer: Self-pay

## 2023-06-07 DIAGNOSIS — R0981 Nasal congestion: Secondary | ICD-10-CM

## 2023-06-07 DIAGNOSIS — J0140 Acute pansinusitis, unspecified: Secondary | ICD-10-CM

## 2023-06-07 MED ORDER — LEVOCETIRIZINE DIHYDROCHLORIDE 5 MG PO TABS
5.0000 mg | ORAL_TABLET | Freq: Every evening | ORAL | 1 refills | Status: AC
  Filled 2023-06-07: qty 30, 30d supply, fill #0

## 2023-06-07 MED ORDER — DOXYCYCLINE HYCLATE 100 MG PO CAPS
100.0000 mg | ORAL_CAPSULE | Freq: Two times a day (BID) | ORAL | 0 refills | Status: AC
  Filled 2023-06-07: qty 20, 10d supply, fill #0

## 2023-06-07 MED ORDER — FLUTICASONE PROPIONATE 50 MCG/ACT NA SUSP
1.0000 | Freq: Every day | NASAL | 0 refills | Status: AC
  Filled 2023-06-07: qty 16, 60d supply, fill #0

## 2023-06-07 NOTE — ED Provider Notes (Signed)
 [facility]    CSN: [id number] Arrival date & time: 06/07/23  0830      History   Chief Complaint Chief Complaint  Patient presents with   Nasal Congestion   Dental Pain   Sore Throat    HPI [facility] Katie Simmons is a 48 y.o. female.   Patient presents today with a several week history of URI symptoms including worsening nasal congestion prompting evaluation.  She is Spanish-speaking and video interpreter was utilized during visit.  She denies any known sick contacts.  She reports sinus pressure, nasal congestion, occasional cough, chest discomfort with coughing.  She denies any fever, chest pain, shortness of breath, nausea, vomiting, diarrhea.  She denies history of allergies.  She has not been taking any over-the-counter medications for symptom management.  She does report being treated for dental infection about a month ago but does not remember with any of the medication she was prescribed was.  She denies additional antibiotics in the past 90 days.  Denies any recent steroid use.  She is confident that she is not pregnant.    Past Medical History:  Diagnosis Date   Diabetes mellitus without complication [facility])     Patient Active Problem List   Diagnosis Date Noted   Hyperlipidemia 03/05/2022   Nausea vomiting and diarrhea 02/19/2021   AKI (acute kidney injury) (HCC) 02/19/2021   Hypokalemia 02/19/2021   Hyponatremia 02/19/2021   Cholelithiasis 02/19/2021   Transaminitis 02/19/2021   E coli bacteremia 01/06/2018   Sepsis (HCC) 01/03/2018   Diabetes mellitus without complication (HCC) 01/03/2018   Pyelonephritis    Hypotension     History reviewed. No pertinent surgical history.  OB History   No obstetric history on file.      Home Medications    Prior to Admission medications   Medication Sig Start Date End Date Taking? Authorizing Provider  doxycycline  (VIBRAMYCIN ) 100 MG capsule Take 1 capsule (100 mg total) by mouth 2 (two) times daily.  06/07/23  Yes Rylea Selway K, PA-C  fluticasone  (FLONASE ) 50 MCG/ACT nasal spray Place 1 spray into both nostrils daily. 06/07/23  Yes Aqib Lough, Betsey Brow, PA-C  Insulin  Glargine (BASAGLAR  KWIKPEN) 100 UNIT/ML Inject 40 Units into the skin at bedtime. 12/17/22  Yes Marius Siemens, NP  levocetirizine (XYZAL ALLERGY 24HR) 5 MG tablet Take 1 tablet (5 mg total) by mouth every evening. 06/07/23  Yes Sanay Belmar, Betsey Brow, PA-C  metFORMIN  (GLUCOPHAGE ) 500 MG tablet Take 1 tablet (500 mg total) by mouth 2 (two) times daily with a meal. 12/17/22  Yes Marius Siemens, NP  atorvastatin  (LIPITOR) 80 MG tablet Take 1 tablet (80 mg total) by mouth daily. 12/20/22   Marius Siemens, NP  Semaglutide ,0.25 or 0.5MG /DOS, 2 MG/3ML SOPN Inject 0.25 mg into the skin once a week. 12/17/22   Marius Siemens, NP  TRUEplus Lancets 28G MISC Testing 3 (three) times daily. Patient not taking: Reported on 03/05/2022 10/16/21   Newlin, Enobong, MD    Family History History reviewed. No pertinent family history.  Social History Social History   Tobacco Use   Smoking status: Never   Smokeless tobacco: Never  Vaping Use   Vaping status: Never Used  Substance Use Topics   Alcohol use: No    Alcohol/week: 0.0 standard drinks of alcohol   Drug use: No     Allergies   Penicillins   Review of Systems Review of Systems  Constitutional:  Positive for activity change. Negative for appetite  change, fatigue and fever.  HENT:  Positive for congestion, postnasal drip, rhinorrhea, sinus pressure and sinus pain. Negative for sneezing and sore throat.   Respiratory:  Positive for cough. Negative for shortness of breath.   Cardiovascular:  Negative for chest pain.  Gastrointestinal:  Negative for abdominal pain, diarrhea, nausea and vomiting.  Neurological:  Positive for headaches (sinus). Negative for dizziness and light-headedness.     Physical Exam Triage Vital Signs ED Triage Vitals [06/07/23 0914]  Encounter Vitals  Group     BP 122/80     Systolic BP Percentile      Diastolic BP Percentile      Pulse Rate 90     Resp 18     Temp 98.4 F (36.9 C)     Temp Source Oral     SpO2 98 %     Weight      Height      Head Circumference      Peak Flow      Pain Score      Pain Loc      Pain Education      Exclude from Growth Chart    No data found.  Updated Vital Signs BP 122/80 (BP Location: Right Arm)   Pulse 90   Temp 98.4 F (36.9 C) (Oral)   Resp 18   LMP 05/06/2023 (Exact Date)   SpO2 98%   Visual Acuity Right Eye Distance:   Left Eye Distance:   Bilateral Distance:    Right Eye Near:   Left Eye Near:    Bilateral Near:     Physical Exam Vitals reviewed.  Constitutional:      General: She is awake. She is not in acute distress.    Appearance: Normal appearance. She is well-developed. She is not ill-appearing.     Comments: Very pleasant female appears stated age in no acute distress sitting comfortably in exam room  HENT:     Head: Normocephalic and atraumatic.     Right Ear: Tympanic membrane, ear canal and external ear normal. There is impacted cerumen. Tympanic membrane is not erythematous or bulging.     Left Ear: Tympanic membrane, ear canal and external ear normal. Tympanic membrane is not erythematous or bulging.     Ears:     Comments: Right ear: Impacted cerumen noted.  Able to visualize approximately 30% of TM that appears normal.    Nose: Congestion present.     Right Sinus: Maxillary sinus tenderness and frontal sinus tenderness present.     Left Sinus: Maxillary sinus tenderness and frontal sinus tenderness present.     Mouth/Throat:     Pharynx: Uvula midline. Postnasal drip present. No oropharyngeal exudate or posterior oropharyngeal erythema.  Cardiovascular:     Rate and Rhythm: Normal rate and regular rhythm.     Heart sounds: Normal heart sounds, S1 normal and S2 normal. No murmur heard. Pulmonary:     Effort: Pulmonary effort is normal.     Breath  sounds: Normal breath sounds. No wheezing, rhonchi or rales.     Comments: Clear to auscultation bilaterally Chest:     Chest wall: Tenderness present. No deformity or swelling.  Psychiatric:        Behavior: Behavior is cooperative.      UC Treatments / Results  Labs (all labs ordered are listed, but only abnormal results are displayed) Labs Reviewed - No data to display  EKG   Radiology No results found.  Procedures Procedures (  including critical care time)  Medications Ordered in UC Medications - No data to display  Initial Impression / Assessment and Plan / UC Course  I have reviewed the triage vital signs and the nursing notes.  Pertinent labs & imaging results that were available during my care of the patient were reviewed by me and considered in my medical decision making (see chart for details).     Patient is well-appearing, afebrile, nontoxic, nontachycardic.  No indication for viral testing as patient has been symptomatic for over several weeks and this would not change our management.  Chest x-ray was deferred as she had no adventitious lung sounds on exam and her oxygen saturation was 98%.  She did report some chest discomfort with coughing but had reproducible chest pain with palpation of chest wall on exam and we discussed that this is most likely related to costochondritis.  She was encouraged to use over-the-counter NSAIDs to help manage this discomfort.  Will treat with doxycycline  to cover for sinus infection.  We discussed that she should avoid prolonged sun exposure while on this medication due to associated photosensitivity.  She was started on Xyzal and fluticasone  nasal spray.  Also recommended she use nasal saline and sinus rinses to help manage her symptoms.  She is to rest and drink plenty of fluid.  Recommend close follow-up with her primary care if her symptoms have not completely resolved within a week.  We discussed that if anything worsens or changes  and she has chest pain, shortness of breath, fever, nausea, vomiting she needs to be seen immediately.  Strict return precautions given.  She declined work excuse note.  Final Clinical Impressions(s) / UC Diagnoses   Final diagnoses:  Acute non-recurrent pansinusitis  Nasal congestion     Discharge Instructions      We are treating you for sinus infection.  Please start doxycycline  100 mg twice daily for 10 days.  Stay out of the sun while on this medication as it will cause you to have a sunburn.  Use fluticasone  nasal spray.  Start levocetirizine at night.  I also recommend nasal saline and sinus rinses to help with the congestion.  Take ibuprofen  for pain.  If your symptoms are not improving quickly please follow-up with your primary care to determine if referral to ENT is appropriate.  If anything worsens and you have cough, shortness of breath, chest pain, fever not responding to medication, nausea/vomiting interfering with oral intake, weakness you need to be seen immediately.  Le estamos tratando por una infeccin sinusal. Comience con doxiciclina 100 mg dos veces al da durante [address] Evite la exposicin al sol mientras est tomando este medicamento, ya que puede causar [employer]. Use fluticasona en aerosol nasal. Comience con levocetirizina por la noche. Tambin recomiendo solucin salina nasal y enjuagues sinusales para aliviar la congestin. Tome ibuprofeno para Chief Technology Officer. Si sus sntomas no mejoran rpidamente, consulte con su mdico de cabecera para determinar si es adecuado derivarlo a Tax inspector. Si algo empeora y presenta tos, dificultad para respirar, dolor en el pecho, fiebre que no responde a la medicacin, nuseas/vmitos que interfieren con la ingesta oral o debilidad, debe ser examinado de inmediato.     ED Prescriptions     Medication Sig Dispense Auth. Provider   doxycycline  (VIBRAMYCIN ) 100 MG capsule Take 1 capsule (100 mg total) by mouth 2 (two)  times daily. 20 capsule Mikalia Fessel K, PA-C   levocetirizine (XYZAL ALLERGY 24HR) 5 MG tablet Take  1 tablet (5 mg total) by mouth every evening. 30 tablet Geron Mulford K, PA-C   fluticasone  (FLONASE ) 50 MCG/ACT nasal spray Place 1 spray into both nostrils daily. 16 g Lennyx Verdell K, PA-C      PDMP not reviewed this encounter.   Budd Cargo, PA-C 06/07/23 0945

## 2023-06-07 NOTE — Discharge Instructions (Addendum)
 We are treating you for sinus infection.  Please start doxycycline  100 mg twice daily for 10 days.  Stay out of the sun while on this medication as it will cause you to have a sunburn.  Use fluticasone  nasal spray.  Start levocetirizine at night.  I also recommend nasal saline and sinus rinses to help with the congestion.  Take ibuprofen  for pain.  If your symptoms are not improving quickly please follow-up with your primary care to determine if referral to ENT is appropriate.  If anything worsens and you have cough, shortness of breath, chest pain, fever not responding to medication, nausea/vomiting interfering with oral intake, weakness you need to be seen immediately.  Le estamos tratando por una infeccin sinusal. Comience con doxiciclina 100 mg dos veces al da durante [address]. Evite la exposicin al sol mientras est tomando este medicamento, ya que puede causar [employer]. Use fluticasona en aerosol nasal. Comience con levocetirizina por la noche. Tambin recomiendo solucin salina nasal y enjuagues sinusales para aliviar la congestin. Tome ibuprofeno para Chief Technology Officer. Si sus sntomas no mejoran rpidamente, consulte con su mdico de cabecera para determinar si es adecuado derivarlo a Tax inspector. Si algo empeora y presenta tos, dificultad para respirar, dolor en el pecho, fiebre que no responde a la medicacin, nuseas/vmitos que interfieren con la ingesta oral o debilidad, debe ser examinado de inmediato.

## 2023-06-07 NOTE — ED Triage Notes (Signed)
 Professional interpreter used for clinical intake  Pt states that she had a mouth infection X 1 month ago, since then she has been congested and she can't sleep with the amount of congestion. She states that the mouth infection is a dental infection, she states she has that infection still. She complains of sore throat and neck pain and nose pain. She isn't taking any meds for any of her sx.
